# Patient Record
Sex: Female | Born: 1955 | Race: Black or African American | Hispanic: No | Smoking: Current every day smoker
Health system: Southern US, Community
[De-identification: ages and names within clinical notes are randomized; demographics above are authoritative.]

## PROBLEM LIST (undated history)

## (undated) DIAGNOSIS — I10 Essential (primary) hypertension: Secondary | ICD-10-CM

## (undated) DIAGNOSIS — R0600 Dyspnea, unspecified: Secondary | ICD-10-CM

## (undated) DIAGNOSIS — J449 Chronic obstructive pulmonary disease, unspecified: Secondary | ICD-10-CM

## (undated) DIAGNOSIS — I509 Heart failure, unspecified: Secondary | ICD-10-CM

## (undated) DIAGNOSIS — I700 Atherosclerosis of aorta: Secondary | ICD-10-CM

## (undated) DIAGNOSIS — Z9981 Dependence on supplemental oxygen: Secondary | ICD-10-CM

## (undated) DIAGNOSIS — M722 Plantar fascial fibromatosis: Secondary | ICD-10-CM

## (undated) DIAGNOSIS — R600 Localized edema: Secondary | ICD-10-CM

## (undated) HISTORY — PX: ABDOMINAL HYSTERECTOMY: SHX81

## (undated) HISTORY — DX: Essential (primary) hypertension: I10

## (undated) HISTORY — DX: Chronic obstructive pulmonary disease, unspecified: J44.9

## (undated) HISTORY — PX: BACK SURGERY: SHX140

---

## 1998-08-08 ENCOUNTER — Encounter: Payer: Self-pay | Admitting: Surgery

## 1998-08-11 ENCOUNTER — Ambulatory Visit (HOSPITAL_COMMUNITY): Admission: RE | Admit: 1998-08-11 | Discharge: 1998-08-11 | Payer: Self-pay | Admitting: Surgery

## 1998-12-05 ENCOUNTER — Ambulatory Visit (HOSPITAL_COMMUNITY): Admission: RE | Admit: 1998-12-05 | Discharge: 1998-12-05 | Payer: Self-pay | Admitting: Internal Medicine

## 1998-12-05 ENCOUNTER — Encounter: Payer: Self-pay | Admitting: Internal Medicine

## 1999-04-14 ENCOUNTER — Inpatient Hospital Stay (HOSPITAL_COMMUNITY): Admission: RE | Admit: 1999-04-14 | Discharge: 1999-04-15 | Payer: Self-pay | Admitting: *Deleted

## 1999-04-14 ENCOUNTER — Encounter: Payer: Self-pay | Admitting: Surgery

## 1999-11-11 ENCOUNTER — Encounter: Admission: RE | Admit: 1999-11-11 | Discharge: 1999-11-11 | Payer: Self-pay | Admitting: *Deleted

## 2001-10-25 ENCOUNTER — Emergency Department (HOSPITAL_COMMUNITY): Admission: EM | Admit: 2001-10-25 | Discharge: 2001-10-25 | Payer: Self-pay | Admitting: Emergency Medicine

## 2001-10-25 ENCOUNTER — Encounter: Payer: Self-pay | Admitting: Emergency Medicine

## 2006-11-18 ENCOUNTER — Inpatient Hospital Stay (HOSPITAL_COMMUNITY): Admission: AD | Admit: 2006-11-18 | Discharge: 2006-11-20 | Payer: Self-pay | Admitting: Family Medicine

## 2007-01-05 ENCOUNTER — Emergency Department (HOSPITAL_COMMUNITY): Admission: EM | Admit: 2007-01-05 | Discharge: 2007-01-06 | Payer: Self-pay | Admitting: Emergency Medicine

## 2008-05-15 ENCOUNTER — Inpatient Hospital Stay (HOSPITAL_COMMUNITY): Admission: EM | Admit: 2008-05-15 | Discharge: 2008-05-17 | Payer: Self-pay | Admitting: Emergency Medicine

## 2008-05-15 ENCOUNTER — Ambulatory Visit: Payer: Self-pay | Admitting: Cardiology

## 2008-05-17 ENCOUNTER — Encounter: Payer: Self-pay | Admitting: Cardiology

## 2008-06-05 ENCOUNTER — Ambulatory Visit: Payer: Self-pay | Admitting: Internal Medicine

## 2008-06-10 ENCOUNTER — Ambulatory Visit: Payer: Self-pay | Admitting: *Deleted

## 2008-07-18 ENCOUNTER — Ambulatory Visit: Payer: Self-pay | Admitting: Family Medicine

## 2008-07-18 LAB — CONVERTED CEMR LAB
BUN: 11 mg/dL (ref 6–23)
CO2: 28 meq/L (ref 19–32)
Calcium: 9.4 mg/dL (ref 8.4–10.5)
Creatinine, Ser: 0.93 mg/dL (ref 0.40–1.20)
Pro B Natriuretic peptide (BNP): 308 pg/mL — ABNORMAL HIGH (ref 0.0–100.0)
Sodium: 143 meq/L (ref 135–145)
Total Bilirubin: 0.9 mg/dL (ref 0.3–1.2)
Total Protein: 6.6 g/dL (ref 6.0–8.3)

## 2008-07-22 ENCOUNTER — Ambulatory Visit: Payer: Self-pay | Admitting: Family Medicine

## 2008-09-04 ENCOUNTER — Ambulatory Visit: Payer: Self-pay | Admitting: Family Medicine

## 2008-09-04 LAB — CONVERTED CEMR LAB
Albumin: 4.1 g/dL (ref 3.5–5.2)
BUN: 13 mg/dL (ref 6–23)
CO2: 24 meq/L (ref 19–32)
Creatinine, Ser: 0.87 mg/dL (ref 0.40–1.20)
Glucose, Bld: 118 mg/dL — ABNORMAL HIGH (ref 70–99)
Potassium: 4.2 meq/L (ref 3.5–5.3)
Pro B Natriuretic peptide (BNP): 88.2 pg/mL (ref 0.0–100.0)
Sodium: 139 meq/L (ref 135–145)
Total Bilirubin: 0.5 mg/dL (ref 0.3–1.2)
Total Protein: 7.1 g/dL (ref 6.0–8.3)

## 2008-09-11 ENCOUNTER — Ambulatory Visit: Payer: Self-pay | Admitting: Internal Medicine

## 2008-11-05 ENCOUNTER — Ambulatory Visit: Payer: Self-pay | Admitting: Internal Medicine

## 2008-11-05 ENCOUNTER — Encounter: Payer: Self-pay | Admitting: Family Medicine

## 2008-11-05 LAB — CONVERTED CEMR LAB
ALT: 17 units/L (ref 0–35)
AST: 17 units/L (ref 0–37)
Albumin: 4.4 g/dL (ref 3.5–5.2)
Alkaline Phosphatase: 63 units/L (ref 39–117)
Basophils Absolute: 0 10*3/uL (ref 0.0–0.1)
Creatinine, Ser: 0.75 mg/dL (ref 0.40–1.20)
Eosinophils Relative: 1 % (ref 0–5)
Glucose, Bld: 96 mg/dL (ref 70–99)
Hemoglobin: 18.7 g/dL — ABNORMAL HIGH (ref 12.0–15.0)
LDL Cholesterol: 110 mg/dL — ABNORMAL HIGH (ref 0–99)
Lymphocytes Relative: 38 % (ref 12–46)
Monocytes Absolute: 0.5 10*3/uL (ref 0.1–1.0)
Monocytes Relative: 8 % (ref 3–12)
Neutro Abs: 3.1 10*3/uL (ref 1.7–7.7)
RBC: 6.26 M/uL — ABNORMAL HIGH (ref 3.87–5.11)
RDW: 14.2 % (ref 11.5–15.5)
TSH: 2.304 microintl units/mL (ref 0.350–4.50)
Total Bilirubin: 0.9 mg/dL (ref 0.3–1.2)
Total Protein: 7.4 g/dL (ref 6.0–8.3)
Triglycerides: 186 mg/dL — ABNORMAL HIGH (ref <150)
VLDL: 37 mg/dL (ref 0–40)

## 2008-11-11 ENCOUNTER — Ambulatory Visit: Payer: Self-pay | Admitting: Internal Medicine

## 2009-09-01 ENCOUNTER — Emergency Department (HOSPITAL_COMMUNITY): Admission: EM | Admit: 2009-09-01 | Discharge: 2009-09-02 | Payer: Self-pay | Admitting: Emergency Medicine

## 2009-09-22 ENCOUNTER — Ambulatory Visit: Payer: Self-pay | Admitting: Internal Medicine

## 2009-09-26 ENCOUNTER — Ambulatory Visit (HOSPITAL_COMMUNITY): Admission: RE | Admit: 2009-09-26 | Discharge: 2009-09-26 | Payer: Self-pay | Admitting: Internal Medicine

## 2009-10-09 ENCOUNTER — Ambulatory Visit: Payer: Self-pay | Admitting: Internal Medicine

## 2009-10-09 ENCOUNTER — Encounter: Payer: Self-pay | Admitting: Family Medicine

## 2009-10-09 LAB — CONVERTED CEMR LAB
ALT: 19 units/L (ref 0–35)
AST: 19 units/L (ref 0–37)
Albumin: 4.5 g/dL (ref 3.5–5.2)
Alkaline Phosphatase: 51 units/L (ref 39–117)
BUN: 17 mg/dL (ref 6–23)
Basophils Absolute: 0 10*3/uL (ref 0.0–0.1)
Basophils Relative: 0 % (ref 0–1)
Calcium: 9.4 mg/dL (ref 8.4–10.5)
Cholesterol: 179 mg/dL (ref 0–200)
Eosinophils Absolute: 0.1 10*3/uL (ref 0.0–0.7)
HCT: 55.8 % — ABNORMAL HIGH (ref 36.0–46.0)
Hemoglobin: 17.8 g/dL — ABNORMAL HIGH (ref 12.0–15.0)
LDL Cholesterol: 114 mg/dL — ABNORMAL HIGH (ref 0–99)
MCHC: 31.9 g/dL (ref 30.0–36.0)
Monocytes Absolute: 0.6 10*3/uL (ref 0.1–1.0)
Platelets: 185 10*3/uL (ref 150–400)
Potassium: 4 meq/L (ref 3.5–5.3)
RBC: 5.91 M/uL — ABNORMAL HIGH (ref 3.87–5.11)
RDW: 15.6 % — ABNORMAL HIGH (ref 11.5–15.5)
Sodium: 144 meq/L (ref 135–145)
TSH: 1.012 microintl units/mL (ref 0.350–4.500)
Total CHOL/HDL Ratio: 3.9
Triglycerides: 97 mg/dL (ref <150)
WBC: 5.8 10*3/uL (ref 4.0–10.5)

## 2009-10-15 ENCOUNTER — Ambulatory Visit: Payer: Self-pay | Admitting: Internal Medicine

## 2011-02-25 LAB — POCT CARDIAC MARKERS: CKMB, poc: <1 ng/mL — ABNORMAL LOW (ref 1.0–8.0)

## 2011-02-25 LAB — BASIC METABOLIC PANEL
BUN: 11 mg/dL (ref 6–23)
CO2: 26 mEq/L (ref 19–32)
Calcium: 8.9 mg/dL (ref 8.4–10.5)
Chloride: 103 mEq/L (ref 96–112)
Creatinine, Ser: 0.86 mg/dL (ref 0.4–1.2)
Potassium: 3.9 mEq/L (ref 3.5–5.1)
Sodium: 137 mEq/L (ref 135–145)

## 2011-02-25 LAB — CBC
Hemoglobin: 18.2 g/dL — ABNORMAL HIGH (ref 12.0–15.0)
MCHC: 33.7 g/dL (ref 30.0–36.0)
MCV: 90.3 fL (ref 78.0–100.0)
RBC: 5.98 MIL/uL — ABNORMAL HIGH (ref 3.87–5.11)
RDW: 14.7 % (ref 11.5–15.5)

## 2011-02-25 LAB — DIFFERENTIAL
Basophils Absolute: 0 10*3/uL (ref 0.0–0.1)
Eosinophils Absolute: 0.1 10*3/uL (ref 0.0–0.7)
Eosinophils Relative: 2 % (ref 0–5)
Neutro Abs: 3.7 10*3/uL (ref 1.7–7.7)

## 2011-04-06 NOTE — Discharge Summary (Signed)
NAMEBRISA, AUTH              ACCOUNT NO.:  [id number]   MEDICAL RECORD NO.:  [id number]          PATIENT TYPE:  INP   LOCATION:  2031                         FACILITY:  [facility]   PHYSICIAN:  Joellyn Rued, PA-C     DATE OF BIRTH:  01/19/1956   DATE OF ADMISSION:  05/15/2008  DATE OF DISCHARGE:  05/17/2008                         DISCHARGE SUMMARY - REFERRING   DISCHARGE DIAGNOSIS:  1. Abnormal EKG.  2. Atypical noncardiac chest discomfort.  3. Hypertension, poorly controlled.  4. Noncompliance, possibly secondary to financial issues.  5. Tobacco use.  6. Hyperlipidemia history as noted previously.   BRIEF HISTORY:  Shari Obrien is a 54 year old African American female who  is referred from Urgent Care secondary to her abnormal EKG.  She states  that while eating a sandwich yesterday she developed abdominal  discomfort followed by diarrhea which lasted approximately 6 hours.  She  had a poor appetite today and some lower extremity edema.  However, her  symptoms resolved.  She was out of her blood pressure medications, so  she went to the Urgent Care Center and was transferred to [facility].  On presentation to [facility], she reported an upper chest  pressure and shortness of breath.  Symptoms began with stretching  movements of her upper extremities.  It is not clear as when she ran out  of her medications for her history of hypertension.  History is also  notable for tobacco use.   LABORATORY DATA:  Admission weight was 111.5 kg.  Admission H&H was  17.8, 51.9, normal indices, platelets 215, WBCs 7.5.  PTT 105, PT 14.3,  sodium 145, potassium 3.4, BUN 11, creatinine 0.95, glucose 96, normal  LFTs.  CK MBs relative indexes and troponins did not indicate an injury  pattern.  BNP was 147.  Fasting lipids 179, triglycerides 83, HDL 32,  LDL 130.  TSH 1.762.  EKGs showed normal sinus rhythm, normal axis,  delayed R-wave, nonspecific ST-T wave changes.  Chest x-ray June  24  showed cardiomegaly, pulmonary vascular congestion, mild increased lung  markings which appeared chronic in origin.  Calcified torturous aorta.   HOSPITAL COURSE:  The patient was admitted to the floor with a blood  pressure 200/120.  She received sublingual nitroglycerin which improved  her blood pressure.  Overnight, she had minimal shortness of breath and  no problems with chest discomfort.  She had ruled out for myocardial  infarction.  Blood pressure remained elevated.  She was initially  scheduled for a stress Myoview.  However, given her elevated blood  pressure, her medications were again given early enough to review.  Dr.  Eden Emms felt that she did not specifically need the stress test or  echocardiogram.  The patient was very upset.  Dr. Eden Emms tried several  times to explain to her, her major concern within regards to her  untreated and uncontrolled hypertension.  Nursing staff gave her a copy  of the [employer] list with medications that she could obtain for four  dollars.  While here in the hospital.  Case management also assisted  with discharge needs  and referral to [facility].  She received 3 days  worth of medication prior to being discharge.  Echocardiogram was  performed on the 26th and showed a normal EF, LVH, possible diastolic  dysfunction.  It was felt that she could be discharged home.  It was  noted at the time the patient was refusing to have a stress Myoview  performed.   DISPOSITION:  The patient was discharged home after receiving 3 days  worth of medications from the pharmacy.  Medications included Coreg 25  mg b.i.d., clonidine 0.1 mg b.i.d., lisinopril/HCTZ 40/25 daily.  These  are available at [facility] for four dollars.  She was asked to arrange an  appointment with [facility] which social work assisted with, and she  has this scheduled at 10:00 a.m. on 05/30/2008.  She is advised no  smoking or tobacco products, to begin a blood pressure diary and  to  bring all medications and her blood pressure diary to all follow-up  appointments.  She was also reminded of a low-sodium, heart-healthy  diet.   TIME OF DISCHARGE:  Thirty five 35 minutes      Joellyn Rued, PA-C     EW/MEDQ  D:  05/17/2008  T:  05/17/2008  Job:  [phone] [id number]   cc:   [facility]  [facility]

## 2011-04-06 NOTE — H&P (Signed)
Shari, Obrien              ACCOUNT NO.:  [id number]   MEDICAL RECORD NO.:  [id number]          PATIENT TYPE:  INP   LOCATION:  1844                         FACILITY:  [facility]   PHYSICIAN:  Theodore Demark, PA-C   DATE OF BIRTH:  [birth year]   DATE OF ADMISSION:  05/15/2008  DATE OF DISCHARGE:                              HISTORY & PHYSICAL   PRIMARY CARE PHYSICIAN:  [facility].   PRIMARY CARDIOLOGIST:  New.   CHIEF COMPLAINT:  Chest pain.   HISTORY OF PRESENT ILLNESS:  Shari Obrien is a 55 year old female with no  previous history of coronary artery disease.  She ate a sandwich from a  machine yesterday and later that day had abdominal pain followed by  diarrhea.  Her symptoms had gradually eased and she has had only one  episode of diarrhea in the last 6 hours.  She did not have any nausea or  vomiting, but she had a poor appetite and some diffuse abdominal pain as  well as some lower extremity edema.  This has largely resolved.  She  knew she was out of her blood pressure medications and had been out for  several weeks.  She went to urgent care where her EKG was abnormal and  she was describing chest pain, so she was sent to [facility]  [facility].   Since Shari Obrien ran out of her medication, she has noticed increased  dyspnea on exertion.  When she exerts herself with things such as  walking uphills or up steps, she feels short of breath and then develops  upper chest pressure.  She will rest and her symptoms will ease.  She  has not had symptoms that started at rest.  Currently, she is symptom-  free.   PAST MEDICAL HISTORY:  1. Hypertension.  2. Obesity.  3. History of pneumonia in 2007.   SURGICAL HISTORY:  She is status post back surgery and hysterectomy.   ALLERGIES:  She is allergic or intolerant to PERIACTIN and ALBUTEROL.   CURRENT MEDICATIONS:  Bayer Back & Body 2 tablets t.i.d.   PREVIOUS MEDICATIONS:  Hydrochlorothiazide, unknown dose.   SOCIAL HISTORY:  She lives in [place] alone and is working for a  [employer] with a job as a Insurance account manager.  She has approximately  a 20-pack-year history of ongoing tobacco use, but denies alcohol or  drug abuse.   FAMILY HISTORY:  Her mother died in her 16s and her father died in his  56s, but she is not aware of any history of coronary artery disease in  either parents or siblings.   REVIEW OF SYSTEMS:  She states that she has night sweats many nights.  She has occasional lower extremity edema.  The dyspnea on exertion is  chronic, but she feels it is little worse since she has been off her  diuretic.  She coughs occasionally and rarely wheezes, but sometimes  does hear herself.  She feels that she has been tired lately.  She has  some chronic back pain.  Other than diarrhea, she has had no GI  symptoms.  Her 14-point review of systems is otherwise negative.   PHYSICAL EXAMINATION:  VITAL SIGNS:  Temperature is 98.9, blood pressure  180/100, pulse 88, respiratory rate 18, and O2 saturation 97% on room  air.  GENERAL:  She is a well-developed [place] female in no acute  distress.  HEENT:  Normal.  NECK:  There is no lymphadenopathy, thyromegaly, bruits, or JVD noted.  CVA:  Her heart is regular in rate and rhythm with no S1-S2.  No  significant murmur, rub, or gallop is noted.  Distal pulses are intact  in all four extremities and a left femoral bruits noted.  LUNGS:  Clear to auscultation bilaterally.  SKIN:  No rashes or lesions are noted.  ABDOMEN:  Soft and diffusely tender with active bowel sounds.  EXTREMITIES:  There is no cyanosis, clubbing, or edema noted.  MUSCULOSKELETAL:  There is no joint deformity or effusions, and no spine  or CVA tenderness.  NEUROLOGIC:  She is alert and oriented.  Cranial nerves II through XII  are grossly intact.   Chest X-Ray shows cardiomegaly and pulmonary vascular congestion with  increased lung markings, which appear  chronic in a calcified aorta.   EKG, sinus rhythm, rate 80, with left atrial enlargement and diffuse T-  wave changes, which are unchanged from an EKG dated February 2008.   LABORATORY VALUES:  Hemoglobin 17.7 and hematocrit 52.  Sodium 142,  potassium 3.4, chloride 106, BUN 14, creatinine 1.1, glucose 101 and  point-of- care markers negative x1.   IMPRESSION:  Shari Obrien is a 55 year old female with longstanding and  probably poorly-controlled hypertension.  She is off her diuretic for a  few weeks and has subjective swelling as well as chest fullness and  dyspnea with exertion.  She has also become lightheaded at work in an  [employer].  She was seen in urgent care and sent to the  emergency department because of her abnormal electrocardiogram.  This is  not, however, significantly changed from an electrocardiogram over a  year ago.  Shari Obrien is advised to stay overnight and be ruled out for  a myocardial infarction.  An echocardiogram and a stress test will be  ordered.  She was initially uncertain what to do, but has chosen to  stay.  She was initially placed on IV heparin and nitroglycerin, but  with no resting chest pain and with a significantly elevated blood  pressure, the risks of the heparin outweigh the benefits and the  nitroglycerin is not needed.  She will be restarted on  hydrochlorothiazide at 25 mg a day and we will add amlodipine.  She is  encouraged to stop smoking.  We will be happy to see her as an  outpatient.      Theodore Demark, PA-C     RB/MEDQ  D:  05/15/2008  T:  05/16/2008  Job:  [id number]

## 2011-04-09 NOTE — H&P (Signed)
[name], Shari Obrien              ACCOUNT NO.:  [id number]   MEDICAL RECORD NO.:  [id number]          PATIENT TYPE:  INP   LOCATION:  6738                         FACILITY:  [facility]   PHYSICIAN:  Ruthe Mannan, M.D.       DATE OF BIRTH:  01/20/1956   DATE OF ADMISSION:  11/18/2006  DATE OF DISCHARGE:                              HISTORY & PHYSICAL   ATTENDING ON ADMISSION:  Dr. Darrick Penna.   PRIMARY CARE Tal Kempker:  Urgent care on [address].   DATE:  November 18, 2006.   TIME:  2:20 p.m.   CHIEF COMPLAINT:  She is a direct admit from [facility] for pneumonia.   HISTORY OF PRESENT ILLNESS:  The patient is a 55 year old female with  past medical history significant only for hypertension who was admitted  to our service from [facility] for pneumonia with increased  infiltrates on chest x-ray and hypoxia.  She reports that she went to  urgent care yesterday for a 1-week history of a full feeling in her  abdomen with dyspnea on exertion.  She denied any changes in her bowel  habits.  She also complains of a cough with blood-tinged sputum x1 week,  myalgias, and feeling feverish, which was subjective, since she did not  take her temperature.  She does endure some wheezing.  Chest x-ray at  urgent care yesterday showed left hilar and right/left lower lobe  infiltrates.  Her influenza test was negative.  She was diagnosed with a  community-acquired pneumonia and sent home with Levaquin 750 mg daily,  ProAIR HCF 2 puffs q.4 h., and was given Rocephin 1 g IM prior to going  home.  Her blood pressure yesterday was 221/110, so she was encouraged  to take her Lasix.  She returned to urgent care today with some  improvement of her symptoms.  She was afebrile at 98.8, but still  complained of wheezing and dyspnea on exertion.  Chest x-ray today  showed increased perihilar infiltrates.  She was also saturating 80%  with activity, and 82% at rest on room air.  She was then sent to [facility] for admission for pneumonia with hypoxia.  She  denied chest pain.   PAST MEDICAL HISTORY:  Hypertension.   SURGICAL HISTORY:  L5 laminectomy.   FAMILY HISTORY:  Diabetes on her grandmother's side.   ALLERGIES:  PERIACTIN AND ALBUTEROL.   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  She is also taking night classes for  medical coding.  She denies any drug or alcohol use.  She does endure  smoking half pack of cigarettes per month x35 years, but has not smoked  in over 1 week.   MEDICATIONS:  Lasix 20 mg p.o. q. a.m.   REVIEW OF SYSTEMS:  GENERAL:  Positive for fever, positive for fatigue,  negative foe weight loss.  HEENT:  Positive for congestion, negative for  ear pain, negative for throat tenderness, negative for headache,  negative for blurry vision.  RESPIRATORY:  Positive for cough,  positive  hemoptysis, positive for shortness of breath, positive for wheezing, no  chest pain.  MUSCULOSKELETAL:  Positive for myalgia.   PHYSICAL EXAMINATION:  VITAL SIGNS:  Temperature 99.3, pulse 104,  respiratory rate 28, blood pressure 197/106, O2 SAT 89% on 3 liters.  GENERAL:  She is alert, no acute distress, no increased work at  breathing.  HEENT:  Pupils equally round, reactive to light, no lymphadenopathy,  tympanic membranes clear without exudate.  RESPIRATORY:  Tachypnea with decreased breath sounds in the right lower  lobe, expiratory wheezes, prolong expiratory phase.  Rhonchi throughout,  and crackles in bilateral upper lobes.  CARDIOVASCULAR SYSTEM:  Tachycardic, no MRG.  EXTREMITIES:  1+ edema bilaterally.  ABDOMEN:  Soft, nontender, positive bowel sounds.   LABS:  From urgent care, white count 4.5, hemoglobin 15.5, hematocrit  47.0, platelets of 258, influenza negative.   ASSESSMENT/PLAN:  This is a 55 year old with a history of hypertension  with:  1. Pneumonia with hypoxia.  The patient  currently hypoxic with oxygen      saturation of 89% on 3 liters.  Hypoxia is likely secondary to      community acquired pneumonia.  She denies any chest pain or cardiac      history with dyspnea on exertion, but since she is only taking      Lasix at home, and her legs are edematous, we will check a 2D      echocardiogram, BMP, and rule out for an MI.  We will also check an      EKG and TSH.  Since we are going to rule out for MI, we will risk      stratify and check a fasting lipid panel and hemoglobin A1c.  We      will check a chest x-ray and continue O2 per nasal cannula and      start IV antibiotics with Rocephin and azithromycin.  We will check      a UA, urine legionella, urine strep, CBC, and CMP. The patient      currently tachypneic, but not currently retracting or febrile.      White blood cells within normal limits.  2. Hypertensive urgency.  The patient has had uncontrolled      hypertension for months, according to patient.  She is often      noncompliant with medications.  Unsure why she is only taking      Lasix.  We will hold Lasix for now, as her kidney function and      fluid status is unknown.  We will give hydralazine 10 mg x1 now and      reassess.  We will check UDS prior to starting beta blocker,      although she is tachycardic, and could tolerate a beta blocker.  3. FEN/GI, heart healthy, low-sodium diet.  4. Prophylaxis, SCDs, and Protonix.           ______________________________  Ruthe Mannan, M.D.     TA/MEDQ  D:  11/18/2006  T:  11/19/2006  Job:  [id number]

## 2011-04-09 NOTE — Discharge Summary (Signed)
Shari Obrien, Shari Obrien              ACCOUNT NO.:  [id number]   MEDICAL RECORD NO.:  [id number]          PATIENT TYPE:  INP   LOCATION:  6738                         FACILITY:  [facility]   PHYSICIAN:  Dwana Curd. Para March, M.D. DATE OF BIRTH:  01-Mar-1956   DATE OF ADMISSION:  11/18/2006  DATE OF DISCHARGE:  11/20/2006                               DISCHARGE SUMMARY   PRIMARY CARE PHYSICIAN:  [facility].   DISCHARGE DIAGNOSES:  1. Pneumonia.  2. Hypertension.   SUBJECTIVE:  The patient is a 55 year old female with a past medical  history of hypertension who was admitted directly to [facility] from  [facility] for pneumonia with infiltration noted on chest x-ray  and hypoxia.  She had a cough with blood tinged sputum for 1 week with  myalgias and subjective fevers.  Chest x-ray showed left hilar and right  lower lobe infiltrates.  She was initially sent home from urgent care  with a diagnosis of community acquired pneumonia on Levaquin.  She  returned for followup at urgent care on the day of admission, now  afebrile, but with wheezing and dyspnea on exertion.  Repeat chest x-ray  showed increasing perihilar infiltrates.  She was saturating at 80% with  activity on room air and was sent to [facility] for admission.   Notable points of physical are pulse of 104, temperature 99.3, blood  pressure 197/106, O2 saturation 89% on 3 liters.  She was alert and  oriented.  She was tachypnic with decreased breath sounds in the right  lower lobe with noted expiratory wheeze.  She was admitted for  management.   HOSPITAL COURSE:  1. Community acquired pneumonia.  The patient was admitted, placed on      Rocephin and azithromycin, along with supplemental oxygen.  She      gradually improved, was able to wean off of oxygen and by the 30th      of December was appropriate for discharge home.  She was discharged      on azithromycin 500 mg 1 tab p.o. daily the day after discharge.      Her PPD  was to be checked the day after discharge at [facility].  2. Hypertension.  The patient had significantly elevated blood      pressures and she was managed with hydrochlorothiazide, lisinopril      and Toprol.  She was also started on aspirin 325 mg p.o. daily.      She was discharged on all of these medications.  By the 30th of      December she did have stable blood pressures with systolic in the      140s.  Because she had this ACE inhibitor started, she needed a      creatinine check at followup.  3. Other issues.  The patient had a 2D echo performed during this      hospitalization.  The results of this were pending at time of      discharge.  She had a modest increase in her CK and also  in her      troponins.  Both of these trended down.  Her maximum troponin was      0.09, this was thought to be due to strain.  She also had a      cholesterol panel that was checked.  Her LDL was less than 100.   DISPOSITION:  Discharged home with followup tomorrow at [facility].  Discharge labs as discussed above.      Dwana Curd Para March, M.D.     GSD/MEDQ  D:  01/10/2007  T:  01/11/2007  Job:  [id number]

## 2011-04-09 NOTE — Discharge Summary (Signed)
NAMELEDORA, DELKER              ACCOUNT NO.:  [id number]   MEDICAL RECORD NO.:  [id number]          PATIENT TYPE:  INP   LOCATION:  6738                         FACILITY:  [facility]   PHYSICIAN:  Janetta Hora. Fields, MD  DATE OF BIRTH:  Aug 08, 1956   DATE OF ADMISSION:  11/18/2006  DATE OF DISCHARGE:  11/20/2006                               DISCHARGE SUMMARY   DISCHARGE DIAGNOSES:  1. Community acquired pneumonia.  2. Hypertension.  3. Hypokalemia.   DISCHARGE MEDICATIONS:  1. Aspirin 325 mg 1 p.o. daily.  2. Toprol-XL 100 mg 1 p.o. daily.  3. Lisinopril 5 mg 1 p.o. daily.  4. Hydrochlorothiazide 12.5 mg 1 p.o. daily.  5. Azithromycin 500 mg 1 p.o. daily for 1 day on Monday, November 21, 2006.   FOLLOWUP:  Patient is to followup tomorrow, November 21, 2006 at urgent  care to get a PPD check.  She will also need a followup BMET in 1 week  to check her creatinine and potassium.   DIAGNOSTIC STUDIES:  EKG:  Normal sinus rhythm with no acute ST changes.  Chest x-ray:  With diffuse perihilar infiltrate.   CONSULTANTS:  None.   ADMISSION H&P:  The patient is a pleasant 55 year old [place] American  female with a history of failed outpatient treatment for community  acquired pneumonia that presented with hypertensive urgency.   HOSPITAL COURSE:  1. Community acquired pneumonia.  The patient was admitted and placed      on IV Rocephin and Zithromax.  She was given supplemental oxygen.      She responded well to this.  She was gradually waned off of O2.      Her exercise tolerance increased as of November 20, 2006.  She was      able to ambulate freely off O2.  She was afebrile.  She still did      have productive cough.  She was having no chest pain.  She felt      that she was greatly improved from her admission status and was      asking to go home.  2. Hypertension.  The patient was admitted with a systolic blood      pressure of greater than 200.  At this point, she had  only been      taking Lasix.  She was started on a regimen of Toprol and      lisinopril in addition to her Lasix.  She tolerated all these      medications well. She did have hypokalemia that was noted.  This      was repleted.  She was initially continued on Lasix.  The patient      did have hypokalemia, which was repleted.  This could possibly be      exacerbated by the Lasix; therefore, the Lasix was stopped.  At      time of discharge, she had a blood pressure of 140 systolic/80s      diastolic.  She was successfully.  Because of the hypokalemia, her      Lasix was  stopped and she was sent home with a prescription for      hydrochlorothiazide, check her potassium and creatinine.  She will      need a followup BMET in 1 week to check her potassium and      creatinine.  3. Hypokalemia.  I repleted successfully.   LABS:  Discharge white count 5.7, hemoglobin 15.2.  Basic metabolic  panel within normal limits with a creatinine of 0.9 and a potassium of  4.0.  Hemoglobin A1c 6.4.  Total cholesterol 139, triglycerides 113, HDL  21, LDL 95.   The patient did have serial cardiac enzymes and serial EKGs during this  hospitalization.  She had a very modest increase in her troponin  initially at 0.9.  This was followed.  She was chest pain free and her  cardiac enzymes trended down.  She had no changes on the EKG.  It was  felt that this was due to strain.  Further cardiac workup is left to the  primary MD as an outpatient.      Dwana Curd Para March, M.D.      Janetta Hora. Darrick Penna, MD     GSD/MEDQ  D:  11/20/2006  T:  11/21/2006  Job:  [id number]

## 2011-08-19 LAB — POCT CARDIAC MARKERS
CKMB, poc: 1.7
CKMB, poc: <1 — ABNORMAL LOW
Myoglobin, poc: 48.2
Operator id: 277751
Troponin i, poc: <0.05

## 2011-08-19 LAB — CK TOTAL AND CKMB (NOT AT ARMC)
CK, MB: 3.3
Total CK: 181 — ABNORMAL HIGH

## 2011-08-19 LAB — B-NATRIURETIC PEPTIDE (CONVERTED LAB): Pro B Natriuretic peptide (BNP): 147 — ABNORMAL HIGH

## 2011-08-19 LAB — DIFFERENTIAL
Basophils Absolute: 0.1
Lymphs Abs: 3.3
Neutro Abs: 3.2
Neutrophils Relative %: 43

## 2011-08-19 LAB — COMPREHENSIVE METABOLIC PANEL
ALT: 35
AST: 25
Albumin: 3.2 — ABNORMAL LOW
CO2: 32
Calcium: 9
Chloride: 105
Creatinine, Ser: 0.95
GFR calc Af Amer: >60
Sodium: 145
Total Bilirubin: 1

## 2011-08-19 LAB — TROPONIN I: Troponin I: 0.04

## 2011-08-19 LAB — POCT I-STAT, CHEM 8
Calcium, Ion: 0.97 — ABNORMAL LOW
Creatinine, Ser: 1.1
Glucose, Bld: 101 — ABNORMAL HIGH

## 2011-08-19 LAB — CBC
MCHC: 34.2
Platelets: 219
WBC: 7.5

## 2011-08-19 LAB — CARDIAC PANEL(CRET KIN+CKTOT+MB+TROPI)
CK, MB: 2.3
CK, MB: 2.9
Relative Index: 2.2
Total CK: 118
Troponin I: 0.04

## 2011-08-19 LAB — PROTIME-INR
INR: 1.1
Prothrombin Time: 14.3

## 2011-08-19 LAB — LIPID PANEL: Total CHOL/HDL Ratio: 5.6

## 2011-08-19 LAB — TSH: TSH: 1.762

## 2013-08-30 ENCOUNTER — Ambulatory Visit: Payer: Self-pay | Attending: Internal Medicine | Admitting: Internal Medicine

## 2013-08-30 VITALS — BP 169/91 | HR 75 | Temp 98.2°F | Resp 17 | Wt 223.8 lb

## 2013-08-30 DIAGNOSIS — I10 Essential (primary) hypertension: Secondary | ICD-10-CM | POA: Insufficient documentation

## 2013-08-30 HISTORY — DX: Essential (primary) hypertension: I10

## 2013-08-30 MED ORDER — CARVEDILOL 25 MG PO TABS: 25.0000 mg | ORAL_TABLET | Freq: Two times a day (BID) | ORAL | Status: DC

## 2013-08-30 MED ORDER — LISINOPRIL-HYDROCHLOROTHIAZIDE 20-12.5 MG PO TABS: 1.0000 | ORAL_TABLET | Freq: Every day | ORAL | Status: DC

## 2013-08-30 MED ORDER — AMLODIPINE BESYLATE 10 MG PO TABS: 10.0000 mg | ORAL_TABLET | Freq: Every day | ORAL | Status: DC

## 2013-08-30 MED ORDER — FUROSEMIDE 20 MG PO TABS: 20.0000 mg | ORAL_TABLET | Freq: Every day | ORAL | Status: DC

## 2013-08-30 NOTE — Progress Notes (Signed)
Patient ID: Shari Obrien, female   DOB: 1956-01-12, 57 y.o.   MRN: [id number]   CC:  Regular followup  HPI: Patient is 57 year old female with hypertension who presents to clinic for followup. She explains is her first visit in this clinic, she needs refills on her blood pressure medicines as she has not taken it for over several months. She denies chest pain or shortness of breath, no specific abdominal or urinary concerns, no fevers or chills, no focal neurological symptoms. She denies checking blood pressure regularly at home and is not sure how well his control. She would like to hold off on any blood work today as she cannot financially afford it and would like to defer this until next visit.  Allergies not on file Past Medical History  Diagnosis Date  . Hypertension    No current outpatient prescriptions on file prior to visit.   No current facility-administered medications on file prior to visit.   No known family medical history History   Social History  . Marital Status: Legally Separated    Spouse Name: N/A    Number of Children: N/A  . Years of Education: N/A   Occupational History  . Not on file.   Social History Main Topics  . Smoking status: Current Every Day Smoker    Types: Cigarettes  . Smokeless tobacco: Not on file  . Alcohol Use: Not on file  . Drug Use: Not on file  . Sexual Activity: Not on file   Other Topics Concern  . Not on file   Social History Narrative  . No narrative on file    Review of Systems  Constitutional: Negative for fever, chills, diaphoresis, activity change, appetite change and fatigue.  HENT: Negative for ear pain, nosebleeds, congestion, facial swelling, rhinorrhea, neck pain, neck stiffness and ear discharge.   Eyes: Negative for pain, discharge, redness, itching and visual disturbance.  Respiratory: Negative for cough, choking, chest tightness, shortness of breath, wheezing and stridor.   Cardiovascular: Negative for chest  pain, palpitations and leg swelling.  Gastrointestinal: Negative for abdominal distention.  Genitourinary: Negative for dysuria, urgency, frequency, hematuria, flank pain, decreased urine volume, difficulty urinating and dyspareunia.  Musculoskeletal: Negative for back pain, joint swelling, arthralgias and gait problem.  Neurological: Negative for dizziness, tremors, seizures, syncope, facial asymmetry, speech difficulty, weakness, light-headedness, numbness and headaches.  Hematological: Negative for adenopathy. Does not bruise/bleed easily.  Psychiatric/Behavioral: Negative for hallucinations, behavioral problems, confusion, dysphoric mood, decreased concentration and agitation.    Objective:   Filed Vitals:   08/30/13 1423  BP: 169/91  Pulse: 75  Temp: 98.2 F (36.8 C)  Resp: 17    Physical Exam  Constitutional: Appears well-developed and well-nourished. No distress.  HENT: Normocephalic. External right and left ear normal. Oropharynx is clear and moist.  Eyes: Conjunctivae and EOM are normal. PERRLA, no scleral icterus.  Neck: Normal ROM. Neck supple. No JVD. No tracheal deviation. No thyromegaly.  CVS: RRR, S1/S2 +, no murmurs, no gallops, no carotid bruit.  Pulmonary: Effort and breath sounds normal, no stridor, rhonchi, wheezes, rales.  Abdominal: Soft. BS +,  no distension, tenderness, rebound or guarding.  Musculoskeletal: Normal range of motion. No edema and no tenderness.  Lymphadenopathy: No lymphadenopathy noted, cervical, inguinal. Neuro: Alert. Normal reflexes, muscle tone coordination. No cranial nerve deficit. Skin: Skin is warm and dry. No rash noted. Not diaphoretic. No erythema. No pallor.  Psychiatric: Normal mood and affect. Behavior, judgment, thought content normal.   Lab  Results  Component Value Date   WBC 5.8 10/09/2009   HGB 17.8* 10/09/2009   HCT 55.8* 10/09/2009   MCV 94.4 10/09/2009   PLT 185 10/09/2009   Lab Results  Component Value Date    CREATININE 0.81 10/09/2009   BUN 17 10/09/2009   NA 144 10/09/2009   K 4.0 10/09/2009   CL 105 10/09/2009   CO2 23 10/09/2009    No results found for this basename: HGBA1C   Lipid Panel     Component Value Date/Time   CHOL 179 10/09/2009 2039   TRIG 97 10/09/2009 2039   HDL 46 10/09/2009 2039   CHOLHDL 3.9 Ratio 10/09/2009 2039   VLDL 19 10/09/2009 2039   LDLCALC 114* 10/09/2009 2039       Assessment and plan:   Patient Active Problem List   Diagnosis Date Noted  . HTN (hypertension) 08/30/2013  - uncontrolled and above target goal, we have discussed target blood pressure and importance of medical compliance - Will provide refill on today's visit, I have advised patient to come back within next week to a month for blood work which would include electrolyte panel, cholesterol panel, A1c - Patient verbalized understanding if she is not able to give blood test done today due to financial difficulties - She is in process of applying for orange card and will like to have blood work done once that's completed

## 2013-08-30 NOTE — Progress Notes (Signed)
Patient here to establish care Needs HTN medications refilled

## 2013-08-30 NOTE — Patient Instructions (Signed)

## 2014-09-18 ENCOUNTER — Other Ambulatory Visit: Payer: Self-pay | Admitting: *Deleted

## 2014-09-18 MED ORDER — AMLODIPINE BESYLATE 10 MG PO TABS
10.0000 mg | ORAL_TABLET | Freq: Every day | ORAL | Status: DC
Start: 2014-09-18 — End: 2014-09-23

## 2014-09-18 MED ORDER — CARVEDILOL 25 MG PO TABS
25.0000 mg | ORAL_TABLET | Freq: Two times a day (BID) | ORAL | Status: DC
Start: 2014-09-18 — End: 2014-09-23

## 2014-09-18 MED ORDER — LISINOPRIL-HYDROCHLOROTHIAZIDE 20-12.5 MG PO TABS: 1.0000 | ORAL_TABLET | Freq: Every day | ORAL | Status: DC

## 2014-09-18 MED ORDER — FUROSEMIDE 20 MG PO TABS: 20.0000 mg | ORAL_TABLET | Freq: Every day | ORAL | Status: DC

## 2014-09-23 ENCOUNTER — Encounter: Payer: Self-pay | Admitting: Internal Medicine

## 2014-09-23 ENCOUNTER — Ambulatory Visit: Payer: Self-pay | Attending: Internal Medicine | Admitting: Internal Medicine

## 2014-09-23 VITALS — BP 143/77 | HR 77 | Temp 97.9°F | Resp 18 | Ht 66.0 in | Wt 223.0 lb

## 2014-09-23 DIAGNOSIS — I10 Essential (primary) hypertension: Secondary | ICD-10-CM | POA: Insufficient documentation

## 2014-09-23 DIAGNOSIS — J42 Unspecified chronic bronchitis: Secondary | ICD-10-CM | POA: Insufficient documentation

## 2014-09-23 DIAGNOSIS — J449 Chronic obstructive pulmonary disease, unspecified: Secondary | ICD-10-CM | POA: Insufficient documentation

## 2014-09-23 DIAGNOSIS — Z90710 Acquired absence of both cervix and uterus: Secondary | ICD-10-CM | POA: Insufficient documentation

## 2014-09-23 DIAGNOSIS — F17210 Nicotine dependence, cigarettes, uncomplicated: Secondary | ICD-10-CM | POA: Insufficient documentation

## 2014-09-23 DIAGNOSIS — Z1239 Encounter for other screening for malignant neoplasm of breast: Secondary | ICD-10-CM

## 2014-09-23 MED ORDER — CARVEDILOL 25 MG PO TABS: 25.0000 mg | ORAL_TABLET | Freq: Two times a day (BID) | ORAL | Status: DC

## 2014-09-23 MED ORDER — ALBUTEROL SULFATE HFA 108 (90 BASE) MCG/ACT IN AERS: 2.0000 | INHALATION_SPRAY | Freq: Four times a day (QID) | RESPIRATORY_TRACT | Status: DC | PRN

## 2014-09-23 MED ORDER — LISINOPRIL-HYDROCHLOROTHIAZIDE 20-12.5 MG PO TABS: 1.0000 | ORAL_TABLET | Freq: Every day | ORAL | Status: DC

## 2014-09-23 MED ORDER — FLUTICASONE-SALMETEROL 100-50 MCG/DOSE IN AEPB: 1.0000 | INHALATION_SPRAY | Freq: Two times a day (BID) | RESPIRATORY_TRACT | Status: DC

## 2014-09-23 MED ORDER — FUROSEMIDE 20 MG PO TABS: 20.0000 mg | ORAL_TABLET | Freq: Two times a day (BID) | ORAL | Status: DC

## 2014-09-23 MED ORDER — AMLODIPINE BESYLATE 10 MG PO TABS: 10.0000 mg | ORAL_TABLET | Freq: Every day | ORAL | Status: DC

## 2014-09-23 NOTE — Patient Instructions (Addendum)
Smoking Cessation  Quitting smoking is important to your health and has many advantages. However, it is not always easy to quit since nicotine is a very addictive drug. Oftentimes, people try 3 times or more before being able to quit. This document explains the best ways for you to prepare to quit smoking. Quitting takes hard work and a lot of effort, but you can do it.  ADVANTAGES OF QUITTING SMOKING   You will live longer, feel better, and live better.   Your body will feel the impact of quitting smoking almost immediately.   Within 20 minutes, blood pressure decreases. Your pulse returns to its normal level.   After 8 hours, carbon monoxide levels in the blood return to normal. Your oxygen level increases.   After 24 hours, the chance of having a heart attack starts to decrease. Your breath, hair, and body stop smelling like smoke.   After 48 hours, damaged nerve endings begin to recover. Your sense of taste and smell improve.   After 72 hours, the body is virtually free of nicotine. Your bronchial tubes relax and breathing becomes easier.   After 2 to 12 weeks, lungs can hold more air. Exercise becomes easier and circulation improves.   The risk of having a heart attack, stroke, cancer, or lung disease is greatly reduced.   After 1 year, the risk of coronary heart disease is cut in half.   After 5 years, the risk of stroke falls to the same as a nonsmoker.   After 10 years, the risk of lung cancer is cut in half and the risk of other cancers decreases significantly.   After 15 years, the risk of coronary heart disease drops, usually to the level of a nonsmoker.   If you are pregnant, quitting smoking will improve your chances of having a healthy baby.   The people you live with, especially any children, will be healthier.   You will have extra money to spend on things other than cigarettes.  QUESTIONS TO THINK ABOUT BEFORE ATTEMPTING TO QUIT  You may want to talk about your answers with your  health care provider.   Why do you want to quit?   If you tried to quit in the past, what helped and what did not?   What will be the most difficult situations for you after you quit? How will you plan to handle them?   Who can help you through the tough times? Your family? Friends? A health care provider?   What pleasures do you get from smoking? What ways can you still get pleasure if you quit?  Here are some questions to ask your health care provider:   How can you help me to be successful at quitting?   What medicine do you think would be best for me and how should I take it?   What should I do if I need more help?   What is smoking withdrawal like? How can I get information on withdrawal?  GET READY   Set a quit date.   Change your environment by getting rid of all cigarettes, ashtrays, matches, and lighters in your home, car, or work. Do not let people smoke in your home.   Review your past attempts to quit. Think about what worked and what did not.  GET SUPPORT AND ENCOURAGEMENT  You have a better chance of being successful if you have help. You can get support in many ways.   Tell your family, friends, and   coworkers that you are going to quit and need their support. Ask them not to smoke around you.   Get individual, group, or telephone counseling and support. Programs are available at local hospitals and health centers. Call your local health department for information about programs in your area.   Spiritual beliefs and practices may help some smokers quit.   Download a "quit meter" on your computer to keep track of quit statistics, such as how long you have gone without smoking, cigarettes not smoked, and money saved.   Get a self-help book about quitting smoking and staying off tobacco.  LEARN NEW SKILLS AND BEHAVIORS   Distract yourself from urges to smoke. Talk to someone, go for a walk, or occupy your time with a task.   Change your normal routine. Take a different route to work.  Drink tea instead of coffee. Eat breakfast in a different place.   Reduce your stress. Take a hot bath, exercise, or read a book.   Plan something enjoyable to do every day. Reward yourself for not smoking.   Explore interactive web-based programs that specialize in helping you quit.  GET MEDICINE AND USE IT CORRECTLY  Medicines can help you stop smoking and decrease the urge to smoke. Combining medicine with the above behavioral methods and support can greatly increase your chances of successfully quitting smoking.   Nicotine replacement therapy helps deliver nicotine to your body without the negative effects and risks of smoking. Nicotine replacement therapy includes nicotine gum, lozenges, inhalers, nasal sprays, and skin patches. Some may be available over-the-counter and others require a prescription.   Antidepressant medicine helps people abstain from smoking, but how this works is unknown. This medicine is available by prescription.   Nicotinic receptor partial agonist medicine simulates the effect of nicotine in your brain. This medicine is available by prescription.  Ask your health care provider for advice about which medicines to use and how to use them based on your health history. Your health care provider will tell you what side effects to look out for if you choose to be on a medicine or therapy. Carefully read the information on the package. Do not use any other product containing nicotine while using a nicotine replacement product.   RELAPSE OR DIFFICULT SITUATIONS  Most relapses occur within the first 3 months after quitting. Do not be discouraged if you start smoking again. Remember, most people try several times before finally quitting. You may have symptoms of withdrawal because your body is used to nicotine. You may crave cigarettes, be irritable, feel very hungry, cough often, get headaches, or have difficulty concentrating. The withdrawal symptoms are only temporary. They are strongest  when you first quit, but they will go away within 10-14 days.  To reduce the chances of relapse, try to:   Avoid drinking alcohol. Drinking lowers your chances of successfully quitting.   Reduce the amount of caffeine you consume. Once you quit smoking, the amount of caffeine in your body increases and can give you symptoms, such as a rapid heartbeat, sweating, and anxiety.   Avoid smokers because they can make you want to smoke.   Do not let weight gain distract you. Many smokers will gain weight when they quit, usually less than 10 pounds. Eat a healthy diet and stay active. You can always lose the weight gained after you quit.   Find ways to improve your mood other than smoking.  FOR MORE INFORMATION   www.smokefree.gov   Document Released:   11/02/2001 Document Revised: 03/25/2014 Document Reviewed: 02/17/2012  [employer] Patient Information 2015 [employer]. This information is not intended to replace advice given to you by your health care provider. Make sure you discuss any questions you have with your health care provider.      Chronic Obstructive Pulmonary Disease  Chronic obstructive pulmonary disease (COPD) is a common lung condition in which airflow from the lungs is limited. COPD is a general term that can be used to describe many different lung problems that limit airflow, including both chronic bronchitis and emphysema. If you have COPD, your lung function will probably never return to normal, but there are measures you can take to improve lung function and make yourself feel better.   CAUSES    Smoking (common).    Exposure to secondhand smoke.    Genetic problems.   Chronic inflammatory lung diseases or recurrent infections.  SYMPTOMS    Shortness of breath, especially with physical activity.    Deep, persistent (chronic) cough with a large amount of thick mucus.    Wheezing.    Rapid breaths (tachypnea).    Gray or bluish discoloration (cyanosis) of the skin, especially in  fingers, toes, or lips.    Fatigue.    Weight loss.    Frequent infections or episodes when breathing symptoms become much worse (exacerbations).    Chest tightness.  DIAGNOSIS   Your health care provider will take a medical history and perform a physical examination to make the initial diagnosis. Additional tests for COPD may include:    Lung (pulmonary) function tests.   Chest X-ray.   CT scan.   Blood tests.  TREATMENT   Treatment available to help you feel better when you have COPD includes:    Inhaler and nebulizer medicines. These help manage the symptoms of COPD and make your breathing more comfortable.   Supplemental oxygen. Supplemental oxygen is only helpful if you have a low oxygen level in your blood.    Exercise and physical activity. These are beneficial for nearly all people with COPD. Some people may also benefit from a pulmonary rehabilitation program.  HOME CARE INSTRUCTIONS    Take all medicines (inhaled or pills) as directed by your health care provider.   Avoid over-the-counter medicines or cough syrups that dry up your airway (such as antihistamines) and slow down the elimination of secretions unless instructed otherwise by your health care provider.    If you are a smoker, the most important thing that you can do is stop smoking. Continuing to smoke will cause further lung damage and breathing trouble. Ask your health care provider for help with quitting smoking. He or she can direct you to community resources or hospitals that provide support.   Avoid exposure to irritants such as smoke, chemicals, and fumes that aggravate your breathing.   Use oxygen therapy and pulmonary rehabilitation if directed by your health care provider. If you require home oxygen therapy, ask your health care provider whether you should purchase a pulse oximeter to measure your oxygen level at home.    Avoid contact with individuals who have a contagious illness.   Avoid extreme temperature  and humidity changes.   Eat healthy foods. Eating smaller, more frequent meals and resting before meals may help you maintain your strength.   Stay active, but balance activity with periods of rest. Exercise and physical activity will help you maintain your ability to do things you want to do.   Preventing infection and hospitalization   is very important when you have COPD. Make sure to receive all the vaccines your health care provider recommends, especially the pneumococcal and influenza vaccines. Ask your health care provider whether you need a pneumonia vaccine.   Learn and use relaxation techniques to manage stress.   Learn and use controlled breathing techniques as directed by your health care provider. Controlled breathing techniques include:    Pursed lip breathing. Start by breathing in (inhaling) through your nose for 1 second. Then, purse your lips as if you were going to whistle and breathe out (exhale) through the pursed lips for 2 seconds.    Diaphragmatic breathing. Start by putting one hand on your abdomen just above your waist. Inhale slowly through your nose. The hand on your abdomen should move out. Then purse your lips and exhale slowly. You should be able to feel the hand on your abdomen moving in as you exhale.    Learn and use controlled coughing to clear mucus from your lungs. Controlled coughing is a series of short, progressive coughs. The steps of controlled coughing are:    Lean your head slightly forward.    Breathe in deeply using diaphragmatic breathing.    Try to hold your breath for 3 seconds.    Keep your mouth slightly open while coughing twice.    Spit any mucus out into a tissue.    Rest and repeat the steps once or twice as needed.  SEEK MEDICAL CARE IF:    You are coughing up more mucus than usual.    There is a change in the color or thickness of your mucus.    Your breathing is more labored than usual.    Your breathing is faster than usual.    SEEK IMMEDIATE MEDICAL CARE IF:    You have shortness of breath while you are resting.    You have shortness of breath that prevents you from:   Being able to talk.    Performing your usual physical activities.    You have chest pain lasting longer than 5 minutes.    Your skin color is more cyanotic than usual.   You measure low oxygen saturations for longer than 5 minutes with a pulse oximeter.  MAKE SURE YOU:    Understand these instructions.   Will watch your condition.   Will get help right away if you are not doing well or get worse.  Document Released: 08/18/2005 Document Revised: 03/25/2014 Document Reviewed: 07/05/2013  [employer] Patient Information 2015 [employer]. This information is not intended to replace advice given to you by your health care provider. Make sure you discuss any questions you have with your health care provider.  Heart Failure  Heart failure is a condition in which the heart has trouble pumping blood. This means your heart does not pump blood efficiently for your body to work well. In some cases of heart failure, fluid may back up into your lungs or you may have swelling (edema) in your lower legs. Heart failure is usually a long-term (chronic) condition. It is important for you to take good care of yourself and follow your health care provider's treatment plan.  CAUSES   Some health conditions can cause heart failure. Those health conditions include:   High blood pressure (hypertension). Hypertension causes the heart muscle to work harder than normal. When pressure in the blood vessels is high, the heart needs to pump (contract) with more force in order to circulate blood throughout the body. High   blood pressure eventually causes the heart to become stiff and weak.   Coronary artery disease (CAD). CAD is the buildup of cholesterol and fat (plaque) in the arteries of the heart. The blockage in the arteries deprives the heart muscle of oxygen and blood. This can  cause chest pain and may lead to a heart attack. High blood pressure can also contribute to CAD.   Heart attack (myocardial infarction). A heart attack occurs when one or more arteries in the heart become blocked. The loss of oxygen damages the muscle tissue of the heart. When this happens, part of the heart muscle dies. The injured tissue does not contract as well and weakens the heart's ability to pump blood.   Abnormal heart valves. When the heart valves do not open and close properly, it can cause heart failure. This makes the heart muscle pump harder to keep the blood flowing.   Heart muscle disease (cardiomyopathy or myocarditis). Heart muscle disease is damage to the heart muscle from a variety of causes. These can include drug or alcohol abuse, infections, or unknown reasons. These can increase the risk of heart failure.   Lung disease. Lung disease makes the heart work harder because the lungs do not work properly. This can cause a strain on the heart, leading it to fail.   Diabetes. Diabetes increases the risk of heart failure. High blood sugar contributes to high fat (lipid) levels in the blood. Diabetes can also cause slow damage to tiny blood vessels that carry important nutrients to the heart muscle. When the heart does not get enough oxygen and food, it can cause the heart to become weak and stiff. This leads to a heart that does not contract efficiently.   Other conditions can contribute to heart failure. These include abnormal heart rhythms, thyroid problems, and low blood counts (anemia).  Certain unhealthy behaviors can increase the risk of heart failure, including:   Being overweight.   Smoking or chewing tobacco.   Eating foods high in fat and cholesterol.   Abusing illicit drugs or alcohol.   Lacking physical activity.  SYMPTOMS   Heart failure symptoms may vary and can be hard to detect. Symptoms may include:   Shortness of breath with activity, such as climbing  stairs.   Persistent cough.   Swelling of the feet, ankles, legs, or abdomen.   Unexplained weight gain.   Difficulty breathing when lying flat (orthopnea).   Waking from sleep because of the need to sit up and get more air.   Rapid heartbeat.   Fatigue and loss of energy.   Feeling light-headed, dizzy, or close to fainting.   Loss of appetite.   Nausea.   Increased urination during the night (nocturia).  DIAGNOSIS   A diagnosis of heart failure is based on your history, symptoms, physical examination, and diagnostic tests. Diagnostic tests for heart failure may include:   Echocardiography.   Electrocardiography.   Chest X-ray.   Blood tests.   Exercise stress test.   Cardiac angiography.   Radionuclide scans.  TREATMENT   Treatment is aimed at managing the symptoms of heart failure. Medicines, behavioral changes, or surgical intervention may be necessary to treat heart failure.   Medicines to help treat heart failure may include:   Angiotensin-converting enzyme (ACE) inhibitors. This type of medicine blocks the effects of a blood protein called angiotensin-converting enzyme. ACE inhibitors relax (dilate) the blood vessels and help lower blood pressure.   Angiotensin receptor blockers (ARBs). This type of   medicine blocks the actions of a blood protein called angiotensin. Angiotensin receptor blockers dilate the blood vessels and help lower blood pressure.   Water pills (diuretics). Diuretics cause the kidneys to remove salt and water from the blood. The extra fluid is removed through urination. This loss of extra fluid lowers the volume of blood the heart pumps.   Beta blockers. These prevent the heart from beating too fast and improve heart muscle strength.   Digitalis. This increases the force of the heartbeat.   Healthy behavior changes include:   Obtaining and maintaining a healthy weight.   Stopping smoking or chewing tobacco.   Eating heart-healthy foods.   Limiting or avoiding  alcohol.   Stopping illicit drug use.   Physical activity as directed by your health care provider.   Surgical treatment for heart failure may include:   A procedure to open blocked arteries, repair damaged heart valves, or remove damaged heart muscle tissue.   A pacemaker to improve heart muscle function and control certain abnormal heart rhythms.   An internal cardioverter defibrillator to treat certain serious abnormal heart rhythms.   A left ventricular assist device (LVAD) to assist the pumping ability of the heart.  HOME CARE INSTRUCTIONS    Take medicines only as directed by your health care provider. Medicines are important in reducing the workload of your heart, slowing the progression of heart failure, and improving your symptoms.   Do not stop taking your medicine unless directed by your health care provider.   Do not skip any dose of medicine.   Refill your prescriptions before you run out of medicine. Your medicines are needed every day.   Engage in moderate physical activity if directed by your health care provider. Moderate physical activity can benefit some people. The elderly and people with severe heart failure should consult with a health care provider for physical activity recommendations.   Eat heart-healthy foods. Food choices should be free of trans fat and low in saturated fat, cholesterol, and salt (sodium). Healthy choices include fresh or frozen fruits and vegetables, fish, lean meats, legumes, fat-free or low-fat dairy products, and whole grain or high fiber foods. Talk to a dietitian to learn more about heart-healthy foods.   Limit sodium if directed by your health care provider. Sodium restriction may reduce symptoms of heart failure in some people. Talk to a dietitian to learn more about heart-healthy seasonings.   Use healthy cooking methods. Healthy cooking methods include roasting, grilling, broiling, baking, poaching, steaming, or stir-frying. Talk to a dietitian to  learn more about healthy cooking methods.   Limit fluids if directed by your health care provider. Fluid restriction may reduce symptoms of heart failure in some people.   Weigh yourself every day. Daily weights are important in the early recognition of excess fluid. You should weigh yourself every morning after you urinate and before you eat breakfast. Wear the same amount of clothing each time you weigh yourself. Record your daily weight. Provide your health care provider with your weight record.   Monitor and record your blood pressure if directed by your health care provider.   Check your pulse if directed by your health care provider.   Lose weight if directed by your health care provider. Weight loss may reduce symptoms of heart failure in some people.   Stop smoking or chewing tobacco. Nicotine makes your heart work harder by causing your blood vessels to constrict. Do not use nicotine gum or patches before talking to   your health care provider.   Keep all follow-up visits as directed by your health care provider. This is important.   Limit alcohol intake to no more than 1 drink per day for nonpregnant women and 2 drinks per day for men. One drink equals 12 ounces of beer, 5 ounces of wine, or 1 ounces of hard liquor. Drinking more than that is harmful to your heart. Tell your health care provider if you drink alcohol several times a week. Talk with your health care provider about whether alcohol is safe for you. If your heart has already been damaged by alcohol or you have severe heart failure, drinking alcohol should be stopped completely.   Stop illicit drug use.   Stay up-to-date with immunizations. It is especially important to prevent respiratory infections through current pneumococcal and influenza immunizations.   Manage other health conditions such as hypertension, diabetes, thyroid disease, or abnormal heart rhythms as directed by your health care provider.   Learn to manage  stress.   Plan rest periods when fatigued.   Learn strategies to manage high temperatures. If the weather is extremely hot:   Avoid vigorous physical activity.   Use air conditioning or fans or seek a cooler location.   Avoid caffeine and alcohol.   Wear loose-fitting, lightweight, and light-colored clothing.   Learn strategies to manage cold temperatures. If the weather is extremely cold:   Avoid vigorous physical activity.   Layer clothes.   Wear mittens or gloves, a hat, and a scarf when going outside.   Avoid alcohol.   Obtain ongoing education and support as needed.   Participate in or seek rehabilitation as needed to maintain or improve independence and quality of life.  SEEK MEDICAL CARE IF:    Your weight increases by 03 lb/1.4 kg in 1 day or 05 lb/2.3 kg in a week.   You have increasing shortness of breath that is unusual for you.   You are unable to participate in your usual physical activities.   You tire easily.   You cough more than normal, especially with physical activity.   You have any or more swelling in areas such as your hands, feet, ankles, or abdomen.   You are unable to sleep because it is hard to breathe.   You feel like your heart is beating fast (palpitations).   You become dizzy or light-headed upon standing up.  SEEK IMMEDIATE MEDICAL CARE IF:    You have difficulty breathing.   There is a change in mental status such as decreased alertness or difficulty with concentration.   You have a pain or discomfort in your chest.   You have an episode of fainting (syncope).  MAKE SURE YOU:    Understand these instructions.   Will watch your condition.   Will get help right away if you are not doing well or get worse.  Document Released: 11/08/2005 Document Revised: 03/25/2014 Document Reviewed: 12/08/2012  [employer] Patient Information 2015 [employer]. This information is not intended to replace advice given to you by your health care provider. Make sure you discuss  any questions you have with your health care provider.

## 2014-09-23 NOTE — Progress Notes (Signed)
F/U HTN Pt stated has Hx COPD and no medication. Pt smoker, stated smoking half of pack now, was smocking 1PPD .

## 2014-09-23 NOTE — Progress Notes (Signed)
Patient ID: Shari Obrien, female   DOB: Apr 18, 1956, 58 y.o.   MRN: [id number]  CC: HTN follow up   HPI:  Patient presents to clinic for a follow up of HTN.  Patient has not been seen in the clinic in over one year.  She reports that she checks her BP at home and it is usually lower than what it is today.  She states that she has quit alcohol and has cut back to smoking only 0.5 ppd.  She is in the process of applying for the hospital discount.  She has a past medical history of COPD, CHF, and HTN.  She has not been on her inhalers in over one year but reports she is having more difficulty at night sleeping due to SOB.  She is using multiple pillows per night to help with breathing.  She has not been performing daily weights.    Allergies  Allergen Reactions  . Periactin [Cyproheptadine] Swelling   Past Medical History  Diagnosis Date  . COPD (chronic obstructive pulmonary disease) Dx 2013  . Hypertension    Current Outpatient Prescriptions on File Prior to Visit  Medication Sig Dispense Refill  . amLODipine (NORVASC) 10 MG tablet Take 1 tablet (10 mg total) by mouth daily. 30 tablet 11  . carvedilol (COREG) 25 MG tablet Take 1 tablet (25 mg total) by mouth 2 (two) times daily with a meal. 60 tablet 11  . furosemide (LASIX) 20 MG tablet Take 1 tablet (20 mg total) by mouth daily. 30 tablet 11  . lisinopril-hydrochlorothiazide (PRINZIDE,ZESTORETIC) 20-12.5 MG per tablet Take 1 tablet by mouth daily. 30 tablet 11   No current facility-administered medications on file prior to visit.   History reviewed. No pertinent family history. History   Social History  . Marital Status: Legally Separated    Spouse Name: N/A    Number of Children: N/A  . Years of Education: N/A   Occupational History  . Not on file.   Social History Main Topics  . Smoking status: Current Every Day Smoker    Types: Cigarettes  . Smokeless tobacco: Not on file  . Alcohol Use: Not on file  . Drug Use: Not on  file  . Sexual Activity: Not on file   Other Topics Concern  . Not on file   Social History Narrative    Review of Systems  HENT:       PND   Respiratory: Positive for cough, shortness of breath and wheezing.   Cardiovascular: Positive for orthopnea, claudication (unsure if related to back surgery) and leg swelling. Negative for chest pain and palpitations.  Musculoskeletal: Positive for myalgias.  Neurological: Positive for dizziness and headaches. Negative for tingling.      Objective:   Filed Vitals:   09/23/14 1200  BP: 143/77  Pulse: 77  Temp: 97.9 F (36.6 C)  Resp: 18    Physical Exam: Constitutional: Patient appears well-developed and well-nourished. No distress. HENT: Normocephalic, atraumatic, External right and left ear normal. Oropharynx is clear and moist.  Eyes: Conjunctivae and EOM are normal. PERRLA, no scleral icterus. Neck: Normal ROM. Neck supple. No JVD. No tracheal deviation. No thyromegaly. CVS: RRR, S1/S2 +, no murmurs, no gallops, no carotid bruit.  Pulmonary: Effort and breath sounds normal, no stridor, rhonchi, wheezes, rales.  Abdominal: Soft. BS +,  no distension, tenderness, rebound or guarding.  Musculoskeletal: Normal range of motion. No edema and no tenderness.  Lymphadenopathy: No lymphadenopathy noted, cervical Neuro: Alert. Normal  reflexes, muscle tone coordination. No cranial nerve deficit. Skin: Skin is warm and dry. No rash noted. Not diaphoretic. No erythema. No pallor. Psychiatric: Normal mood and affect. Behavior, judgment, thought content normal.  Lab Results  Component Value Date   WBC 5.8 10/09/2009   HGB 17.8* 10/09/2009   HCT 55.8* 10/09/2009   MCV 94.4 10/09/2009   PLT 185 10/09/2009   Lab Results  Component Value Date   CREATININE 0.81 10/09/2009   BUN 17 10/09/2009   NA 144 10/09/2009   K 4.0 10/09/2009   CL 105 10/09/2009   CO2 23 10/09/2009    No results found for: HGBA1C Lipid Panel     Component  Value Date/Time   CHOL 179 10/09/2009 2039   TRIG 97 10/09/2009 2039   HDL 46 10/09/2009 2039   CHOLHDL 3.9 Ratio 10/09/2009 2039   VLDL 19 10/09/2009 2039   LDLCALC 114* 10/09/2009 2039       Assessment and plan:   Shari Obrien was seen today for follow-up.  Diagnoses and associated orders for this visit:  Essential hypertension - Refill lisinopril-hydrochlorothiazide (PRINZIDE,ZESTORETIC) 20-12.5 MG per tablet; Take 1 tablet by mouth daily. - Refill furosemide (LASIX) 20 MG tablet; Take 1 tablet (20 mg total) by mouth 2 (two) times daily. - Refill carvedilol (COREG) 25 MG tablet; Take 1 tablet (25 mg total) by mouth 2 (two) times daily with a meal. - Refill amLODipine (NORVASC) 10 MG tablet; Take 1 tablet (10 mg total) by mouth daily. - CBC with Differential - COMPLETE METABOLIC PANEL WITH GFR - Hemoglobin A1c - TSH - Lipid panel  Chronic obstructive pulmonary disease, unspecified COPD, unspecified chronic bronchitis type - Begin albuterol (PROVENTIL HFA;VENTOLIN HFA) 108 (90 BASE) MCG/ACT inhaler; Inhale 2 puffs into the lungs every 6 (six) hours as needed for wheezing or shortness of breath. - Begin Fluticasone-Salmeterol (ADVAIR) 100-50 MCG/DOSE AEPB; Inhale 1 puff into the lungs 2 (two) times daily.  Breast cancer screening - MM Digital Screening; Future Patient had total hysterectomy does not need Pap smear  Return in about 1 week (around 09/30/2014) for Nurse Visit-BP/edema check and 3 mo PCP.        Holland Commons, NP-C [facility] [phone] 09/25/2014, 12:18 AM

## 2014-09-24 LAB — CBC WITH DIFFERENTIAL/PLATELET
Basophils Absolute: 0.1 10*3/uL (ref 0.0–0.1)
Basophils Relative: 1 % (ref 0–1)
Eosinophils Absolute: 0.3 10*3/uL (ref 0.0–0.7)
Eosinophils Relative: 4 % (ref 0–5)
HEMATOCRIT: 53.9 % — AB (ref 36.0–46.0)
Hemoglobin: 18.2 g/dL — ABNORMAL HIGH (ref 12.0–15.0)
LYMPHS ABS: 3 10*3/uL (ref 0.7–4.0)
LYMPHS PCT: 41 % (ref 12–46)
MCH: 30.4 pg (ref 26.0–34.0)
MCHC: 33.8 g/dL (ref 30.0–36.0)
MCV: 90 fL (ref 78.0–100.0)
MONOS PCT: 12 % (ref 3–12)
Monocytes Absolute: 0.9 10*3/uL (ref 0.1–1.0)
NEUTROS PCT: 42 % — AB (ref 43–77)
Neutro Abs: 3.1 10*3/uL (ref 1.7–7.7)
PLATELETS: 209 10*3/uL (ref 150–400)
RBC: 5.99 MIL/uL — ABNORMAL HIGH (ref 3.87–5.11)
RDW: 14.6 % (ref 11.5–15.5)
WBC: 7.4 10*3/uL (ref 4.0–10.5)

## 2014-09-24 LAB — COMPLETE METABOLIC PANEL WITH GFR
ALBUMIN: 4.1 g/dL (ref 3.5–5.2)
ALT: 18 U/L (ref 0–35)
AST: 18 U/L (ref 0–37)
Alkaline Phosphatase: 57 U/L (ref 39–117)
BUN: 12 mg/dL (ref 6–23)
CALCIUM: 9 mg/dL (ref 8.4–10.5)
CHLORIDE: 102 meq/L (ref 96–112)
CO2: 32 meq/L (ref 19–32)
Creat: 0.69 mg/dL (ref 0.50–1.10)
GFR, Est Non African American: >89 mL/min
Glucose, Bld: 86 mg/dL (ref 70–99)
POTASSIUM: 3.8 meq/L (ref 3.5–5.3)
SODIUM: 144 meq/L (ref 135–145)
TOTAL PROTEIN: 6.5 g/dL (ref 6.0–8.3)
Total Bilirubin: 0.5 mg/dL (ref 0.2–1.2)

## 2014-09-24 LAB — LIPID PANEL
CHOL/HDL RATIO: 3.5 ratio
Cholesterol: 149 mg/dL (ref 0–200)
HDL: 42 mg/dL (ref >39)
LDL CALC: 82 mg/dL (ref 0–99)
Triglycerides: 123 mg/dL (ref <150)
VLDL: 25 mg/dL (ref 0–40)

## 2014-09-24 LAB — TSH: TSH: 2.959 u[IU]/mL (ref 0.350–4.500)

## 2014-09-24 LAB — HEMOGLOBIN A1C
Hgb A1c MFr Bld: 6.5 % — ABNORMAL HIGH (ref <5.7)
Mean Plasma Glucose: 140 mg/dL — ABNORMAL HIGH (ref <117)

## 2014-09-30 ENCOUNTER — Ambulatory Visit: Payer: Self-pay | Attending: Internal Medicine

## 2014-09-30 ENCOUNTER — Ambulatory Visit: Payer: Self-pay | Attending: Internal Medicine | Admitting: *Deleted

## 2014-09-30 VITALS — BP 133/76 | HR 69 | Temp 97.5°F

## 2014-09-30 DIAGNOSIS — I10 Essential (primary) hypertension: Secondary | ICD-10-CM | POA: Insufficient documentation

## 2014-09-30 MED ORDER — DICLOFENAC SODIUM 1 % TD GEL: 2.0000 g | Freq: Four times a day (QID) | TRANSDERMAL | Status: DC

## 2014-09-30 MED ORDER — CYCLOBENZAPRINE HCL 10 MG PO TABS: 10.0000 mg | ORAL_TABLET | Freq: Three times a day (TID) | ORAL | Status: DC | PRN

## 2014-09-30 NOTE — Patient Instructions (Addendum)
DASH Eating Plan DASH stands for "Dietary Approaches to Stop Hypertension." The DASH eating plan is a healthy eating plan that has been shown to reduce high blood pressure (hypertension). Additional health benefits may include reducing the risk of type 2 diabetes mellitus, heart disease, and stroke. The DASH eating plan may also help with weight loss. WHAT DO I NEED TO KNOW ABOUT THE DASH EATING PLAN? For the DASH eating plan, you will follow these general guidelines:  Choose foods with a percent daily value for sodium of less than 5% (as listed on the food label).  Use salt-free seasonings or herbs instead of table salt or sea salt.  Check with your health care provider or pharmacist before using salt substitutes.  Eat lower-sodium products, often labeled as "lower sodium" or "no salt added."  Eat fresh foods.  Eat more vegetables, fruits, and low-fat dairy products.  Choose whole grains. Look for the word "whole" as the first word in the ingredient list.  Choose fish and skinless chicken or turkey more often than red meat. Limit fish, poultry, and meat to 6 oz (170 g) each day.  Limit sweets, desserts, sugars, and sugary drinks.  Choose heart-healthy fats.  Limit cheese to 1 oz (28 g) per day.  Eat more home-cooked food and less restaurant, buffet, and fast food.  Limit fried foods.  Cook foods using methods other than frying.  Limit canned vegetables. If you do use them, rinse them well to decrease the sodium.  When eating at a restaurant, ask that your food be prepared with less salt, or no salt if possible. WHAT FOODS CAN I EAT? Seek help from a dietitian for individual calorie needs. Grains Whole grain or whole wheat bread. Brown rice. Whole grain or whole wheat pasta. Quinoa, bulgur, and whole grain cereals. Low-sodium cereals. Corn or whole wheat flour tortillas. Whole grain cornbread. Whole grain crackers. Low-sodium crackers. Vegetables Fresh or frozen vegetables  (raw, steamed, roasted, or grilled). Low-sodium or reduced-sodium tomato and vegetable juices. Low-sodium or reduced-sodium tomato sauce and paste. Low-sodium or reduced-sodium canned vegetables.  Fruits All fresh, canned (in natural juice), or frozen fruits. Meat and Other Protein Products Ground beef (85% or leaner), grass-fed beef, or beef trimmed of fat. Skinless chicken or turkey. Ground chicken or turkey. Pork trimmed of fat. All fish and seafood. Eggs. Dried beans, peas, or lentils. Unsalted nuts and seeds. Unsalted canned beans. Dairy Low-fat dairy products, such as skim or 1% milk, 2% or reduced-fat cheeses, low-fat ricotta or cottage cheese, or plain low-fat yogurt. Low-sodium or reduced-sodium cheeses. Fats and Oils Tub margarines without trans fats. Light or reduced-fat mayonnaise and salad dressings (reduced sodium). Avocado. Safflower, olive, or canola oils. Natural peanut or almond butter. Other Unsalted popcorn and pretzels. The items listed above may not be a complete list of recommended foods or beverages. Contact your dietitian for more options. WHAT FOODS ARE NOT RECOMMENDED? Grains White bread. White pasta. White rice. Refined cornbread. Bagels and croissants. Crackers that contain trans fat. Vegetables Creamed or fried vegetables. Vegetables in a cheese sauce. Regular canned vegetables. Regular canned tomato sauce and paste. Regular tomato and vegetable juices. Fruits Dried fruits. Canned fruit in light or heavy syrup. Fruit juice. Meat and Other Protein Products Fatty cuts of meat. Ribs, chicken wings, bacon, sausage, bologna, salami, chitterlings, fatback, hot dogs, bratwurst, and packaged luncheon meats. Salted nuts and seeds. Canned beans with salt. Dairy Whole or 2% milk, cream, half-and-half, and cream cheese. Whole-fat or sweetened yogurt. Full-fat   cheeses or blue cheese. Nondairy creamers and whipped toppings. Processed cheese, cheese spreads, or cheese  curds. Condiments Onion and garlic salt, seasoned salt, table salt, and sea salt. Canned and packaged gravies. Worcestershire sauce. Tartar sauce. Barbecue sauce. Teriyaki sauce. Soy sauce, including reduced sodium. Steak sauce. Fish sauce. Oyster sauce. Cocktail sauce. Horseradish. Ketchup and mustard. Meat flavorings and tenderizers. Bouillon cubes. Hot sauce. Tabasco sauce. Marinades. Taco seasonings. Relishes. Fats and Oils Butter, stick margarine, lard, shortening, ghee, and bacon fat. Coconut, palm kernel, or palm oils. Regular salad dressings. Other Pickles and olives. Salted popcorn and pretzels. The items listed above may not be a complete list of foods and beverages to avoid. Contact your dietitian for more information. WHERE CAN I FIND MORE INFORMATION? National Heart, Lung, and Blood Institute: www.nhlbi.nih.gov/health/health-topics/topics/dash/ Document Released: 10/28/2011 Document Revised: 03/25/2014 Document Reviewed: 09/12/2013 ExitCare Patient Information 2015 [employer]. This information is not intended to replace advice given to you by your health care provider. Make sure you discuss any questions you have with your health care provider. Diabetes Mellitus and Food It is important for you to manage your blood sugar (glucose) level. Your blood glucose level can be greatly affected by what you eat. Eating healthier foods in the appropriate amounts throughout the day at about the same time each day will help you control your blood glucose level. It can also help slow or prevent worsening of your diabetes mellitus. Healthy eating may even help you improve the level of your blood pressure and reach or maintain a healthy weight.  HOW CAN FOOD AFFECT ME? Carbohydrates Carbohydrates affect your blood glucose level more than any other type of food. Your dietitian will help you determine how many carbohydrates to eat at each meal and teach you how to count carbohydrates. Counting  carbohydrates is important to keep your blood glucose at a healthy level, especially if you are using insulin or taking certain medicines for diabetes mellitus. Alcohol Alcohol can cause sudden decreases in blood glucose (hypoglycemia), especially if you use insulin or take certain medicines for diabetes mellitus. Hypoglycemia can be a life-threatening condition. Symptoms of hypoglycemia (sleepiness, dizziness, and disorientation) are similar to symptoms of having too much alcohol.  If your health care provider has given you approval to drink alcohol, do so in moderation and use the following guidelines:  Women should not have more than one drink per day, and men should not have more than two drinks per day. One drink is equal to:  12 oz of beer.  5 oz of wine.  1 oz of hard liquor.  Do not drink on an empty stomach.  Keep yourself hydrated. Have water, diet soda, or unsweetened iced tea.  Regular soda, juice, and other mixers might contain a lot of carbohydrates and should be counted. WHAT FOODS ARE NOT RECOMMENDED? As you make food choices, it is important to remember that all foods are not the same. Some foods have fewer nutrients per serving than other foods, even though they might have the same number of calories or carbohydrates. It is difficult to get your body what it needs when you eat foods with fewer nutrients. Examples of foods that you should avoid that are high in calories and carbohydrates but low in nutrients include:  Trans fats (most processed foods list trans fats on the Nutrition Facts label).  Regular soda.  Juice.  Candy.  Sweets, such as cake, pie, doughnuts, and cookies.  Fried foods. WHAT FOODS CAN I EAT? Have nutrient-rich foods,   which will nourish your body and keep you healthy. The food you should eat also will depend on several factors, including:  The calories you need.  The medicines you take.  Your weight.  Your blood glucose level.  Your  blood pressure level.  Your cholesterol level. You also should eat a variety of foods, including:  Protein, such as meat, poultry, fish, tofu, nuts, and seeds (lean animal proteins are best).  Fruits.  Vegetables.  Dairy products, such as milk, cheese, and yogurt (low fat is best).  Breads, grains, pasta, cereal, rice, and beans.  Fats such as olive oil, trans fat-free margarine, canola oil, avocado, and olives. DOES EVERYONE WITH DIABETES MELLITUS HAVE THE SAME MEAL PLAN? Because every person with diabetes mellitus is different, there is not one meal plan that works for everyone. It is very important that you meet with a dietitian who will help you create a meal plan that is just right for you. Document Released: 08/05/2005 Document Revised: 11/13/2013 Document Reviewed: 10/05/2013 ExitCare Patient Information 2015 [employer]. This information is not intended to replace advice given to you by your health care provider. Make sure you discuss any questions you have with your health care provider. Diabetes and Exercise Exercising regularly is important. It is not just about losing weight. It has many health benefits, such as:  Improving your overall fitness, flexibility, and endurance.  Increasing your bone density.  Helping with weight control.  Decreasing your body fat.  Increasing your muscle strength.  Reducing stress and tension.  Improving your overall health. People with diabetes who exercise gain additional benefits because exercise:  Reduces appetite.  Improves the body's use of blood sugar (glucose).  Helps lower or control blood glucose.  Decreases blood pressure.  Helps control blood lipids (such as cholesterol and triglycerides).  Improves the body's use of the hormone insulin by:  Increasing the body's insulin sensitivity.  Reducing the body's insulin needs.  Decreases the risk for heart disease because exercising:  Lowers cholesterol and  triglycerides levels.  Increases the levels of good cholesterol (such as high-density lipoproteins [HDL]) in the body.  Lowers blood glucose levels. YOUR ACTIVITY PLAN  Choose an activity that you enjoy and set realistic goals. Your health care provider or diabetes educator can help you make an activity plan that works for you. Exercise regularly as directed by your health care provider. This includes:  Performing resistance training twice a week such as push-ups, sit-ups, lifting weights, or using resistance bands.  Performing 150 minutes of cardio exercises each week such as walking, running, or playing sports.  Staying active and spending no more than 90 minutes at one time being inactive. Even short bursts of exercise are good for you. Three 10-minute sessions spread throughout the day are just as beneficial as a single 30-minute session. Some exercise ideas include:  Taking the dog for a walk.  Taking the stairs instead of the elevator.  Dancing to your favorite song.  Doing an exercise video.  Doing your favorite exercise with a friend. RECOMMENDATIONS FOR EXERCISING WITH TYPE 1 OR TYPE 2 DIABETES   Check your blood glucose before exercising. If blood glucose levels are greater than 240 mg/dL, check for urine ketones. Do not exercise if ketones are present.  Avoid injecting insulin into areas of the body that are going to be exercised. For example, avoid injecting insulin into:  The arms when playing tennis.  The legs when jogging.  Keep a record of:  Food   intake before and after you exercise.  Expected peak times of insulin action.  Blood glucose levels before and after you exercise.  The type and amount of exercise you have done.  Review your records with your health care provider. Your health care provider will help you to develop guidelines for adjusting food intake and insulin amounts before and after exercising.  If you take insulin or oral hypoglycemic  agents, watch for signs and symptoms of hypoglycemia. They include:  Dizziness.  Shaking.  Sweating.  Chills.  Confusion.  Drink plenty of water while you exercise to prevent dehydration or heat stroke. Body water is lost during exercise and must be replaced.  Talk to your health care provider before starting an exercise program to make sure it is safe for you. Remember, almost any type of activity is better than none. Document Released: 01/29/2004 Document Revised: 03/25/2014 Document Reviewed: 04/17/2013 ExitCare Patient Information 2015 ExitCare, [employer]. This information is not intended to replace advice given to you by your health care provider. Make sure you discuss any questions you have with your health care provider.  

## 2014-09-30 NOTE — Progress Notes (Signed)
Patient presents for BP check States taking all meds as directed States LE edema gone after 2-3 days of restarting BP meds States able to breathe better when lying down since starting Advair and albuterol C/O chronic low midline back pain; rates 8-9/10 at present States taking Bayer Back and Body 7 tabs within 9 hour period while working States received flu vaccine at work in September 2015   BP 133/76 P 67 T 97.9 SPO2 94%  No LE edema present  Labs reviewed with patient. Patient States she eats sugary foods  all day long and has been doing this all year. Patient refusing to start metformin at this time. States she wants to change diet first. Patient educated on importance of treating diabetes to protect organ health Patient states she understands and will make diet and exercise changes. States she was told she had diabetes several years ago and was able to make diet changes  to bring numbers into normal range. PCP made aware. Patient aware that when she returns for 3 month f/u in February 2016 she will be started on medication if HgbA1c is not within normal range.    Patient instructed to not take more than 2 tabs every four hours of Bayer Back and Body as taking more can cause GI bleed  Voltaren gel and flexeril 10 mg 1 tab po tid #60 with one refill e-scribed to [facility] per PCP  Patient provided information on DASH Eating Plan, Diabetes and Food and Diabetes and Exercise

## 2014-10-01 NOTE — Progress Notes (Signed)
Labs were reviewed with patient at nurse visit. Please see note from 09/30/14

## 2014-10-03 ENCOUNTER — Other Ambulatory Visit: Payer: Self-pay | Admitting: Internal Medicine

## 2014-10-03 DIAGNOSIS — J449 Chronic obstructive pulmonary disease, unspecified: Secondary | ICD-10-CM

## 2014-10-03 MED ORDER — FLUTICASONE-SALMETEROL 100-50 MCG/DOSE IN AEPB: 1.0000 | INHALATION_SPRAY | Freq: Two times a day (BID) | RESPIRATORY_TRACT | Status: DC

## 2014-10-03 MED ORDER — ALBUTEROL SULFATE HFA 108 (90 BASE) MCG/ACT IN AERS: 2.0000 | INHALATION_SPRAY | Freq: Four times a day (QID) | RESPIRATORY_TRACT | Status: DC | PRN

## 2014-10-14 ENCOUNTER — Ambulatory Visit: Payer: Self-pay

## 2015-03-14 ENCOUNTER — Ambulatory Visit: Payer: Self-pay | Attending: Internal Medicine | Admitting: Family Medicine

## 2015-03-14 VITALS — BP 148/83 | HR 74 | Temp 98.6°F | Resp 16 | Ht 66.0 in | Wt 226.0 lb

## 2015-03-14 DIAGNOSIS — I10 Essential (primary) hypertension: Secondary | ICD-10-CM

## 2015-03-14 DIAGNOSIS — M545 Low back pain: Secondary | ICD-10-CM | POA: Insufficient documentation

## 2015-03-14 DIAGNOSIS — J449 Chronic obstructive pulmonary disease, unspecified: Secondary | ICD-10-CM

## 2015-03-14 DIAGNOSIS — F17210 Nicotine dependence, cigarettes, uncomplicated: Secondary | ICD-10-CM | POA: Insufficient documentation

## 2015-03-14 LAB — COMPREHENSIVE METABOLIC PANEL
ALBUMIN: 3.7 g/dL (ref 3.5–5.2)
ALK PHOS: 59 U/L (ref 39–117)
ALT: 17 U/L (ref 0–35)
AST: 20 U/L (ref 0–37)
BUN: 13 mg/dL (ref 6–23)
CALCIUM: 8.8 mg/dL (ref 8.4–10.5)
CO2: 28 mEq/L (ref 19–32)
Chloride: 103 mEq/L (ref 96–112)
Creat: 0.75 mg/dL (ref 0.50–1.10)
GLUCOSE: 88 mg/dL (ref 70–99)
POTASSIUM: 3.7 meq/L (ref 3.5–5.3)
SODIUM: 143 meq/L (ref 135–145)
TOTAL PROTEIN: 6.3 g/dL (ref 6.0–8.3)
Total Bilirubin: 0.5 mg/dL (ref 0.2–1.2)

## 2015-03-14 LAB — LIPID PANEL
CHOL/HDL RATIO: 4 ratio
Cholesterol: 137 mg/dL (ref 0–200)
HDL: 34 mg/dL — ABNORMAL LOW (ref >=46)
LDL Cholesterol: 88 mg/dL (ref 0–99)
Triglycerides: 73 mg/dL (ref <150)
VLDL: 15 mg/dL (ref 0–40)

## 2015-03-14 MED ORDER — FLUTICASONE-SALMETEROL 100-50 MCG/DOSE IN AEPB: 1.0000 | INHALATION_SPRAY | Freq: Two times a day (BID) | RESPIRATORY_TRACT | Status: DC

## 2015-03-14 MED ORDER — ALBUTEROL SULFATE HFA 108 (90 BASE) MCG/ACT IN AERS: 2.0000 | INHALATION_SPRAY | Freq: Four times a day (QID) | RESPIRATORY_TRACT | Status: DC | PRN

## 2015-03-14 MED ORDER — AMLODIPINE BESYLATE 10 MG PO TABS: 10.0000 mg | ORAL_TABLET | Freq: Every day | ORAL | Status: DC

## 2015-03-14 MED ORDER — LISINOPRIL-HYDROCHLOROTHIAZIDE 20-12.5 MG PO TABS: 1.0000 | ORAL_TABLET | Freq: Every day | ORAL | Status: DC

## 2015-03-14 MED ORDER — METHOCARBAMOL 500 MG PO TABS: 500.0000 mg | ORAL_TABLET | Freq: Four times a day (QID) | ORAL | Status: DC | PRN

## 2015-03-14 MED ORDER — FUROSEMIDE 20 MG PO TABS: 20.0000 mg | ORAL_TABLET | Freq: Two times a day (BID) | ORAL | Status: DC

## 2015-03-14 MED ORDER — CARVEDILOL 25 MG PO TABS: 25.0000 mg | ORAL_TABLET | Freq: Two times a day (BID) | ORAL | Status: DC

## 2015-03-14 NOTE — Progress Notes (Signed)
Subjective:      Shari Obrien, is a 59 y.o. female  [id number]    DOB - May 05, 1956  CC:  Chief Complaint  Patient presents with  . Follow-up        HPI:  Patient presents today for follow-up of hypertension and back pain. She was seen last about 6 months ago. She denies any issues except for the back pain.  She stands a lot at work and by end of day is in significant pain. She has not found Flexeril help    Allergies  Allergen Reactions  . Periactin [Cyproheptadine] Swelling    Past Medical History  Diagnosis Date  . COPD (chronic obstructive pulmonary disease) Dx 2013  . Hypertension    Solangel's family history is not on file.  History   Social History  . Marital Status: Legally Separated    Spouse Name: N/A  . Number of Children: N/A  . Years of Education: N/A   Occupational History  . Not on file.   Social History Main Topics  . Smoking status: Current Every Day Smoker    Types: Cigarettes  . Smokeless tobacco: Not on file  . Alcohol Use: Not on file  . Drug Use: Not on file  . Sexual Activity: Not on file   Other Topics Concern  . Not on file   Social History Narrative   Past Surgical History  Procedure Laterality Date  . Abdominal hysterectomy         ROS:  GEN:   Denies fever, chills,WT loss, loss o HENT:   Denies  earache, epistaxis, sore throat, or neck pain, or headaches EYES:   Denies eye pain or drainage.                LUNGS:  Denies SOB with rest or walking; couging, choking CV:   Denies CP or palpitations             EXT:    Admits to low back pain. Denies radiation into legs. NEURO:   Denies numbness or tingling, denies seizures  Objective:  Filed Vitals:   03/14/15 1011  BP: 148/83  Pulse: 74  Temp: 98.6 F (37 C)  TempSrc: Oral  Resp: 16  Height: 5\' 6"  (1.676 m)  Weight: 226 lb (102.513 kg)  SpO2: 89%    Physical Exam:  General:  in no acute distress. HEENT:  Normocephalic, atraumatic, no pallor, no  icterus, moist oral mucosa,  Neck:   Supple FROM w/o adenopathy, tenderness, or thyroidomegaly. Heart:   Normal  s1 &s2  Regular rate and rhythm, without M,G,R Lungs:   Clear to auscultation bilaterally. No increase in respiratory effort. Abdomen:  Soft, nontender, nondistended, positive bowel sounds. Exetremeties:  No pedal edema. Neuro:   Alert, awake, oriented x3, nonfocal.       Medications: Prior to Admission medications   Medication Sig Start Date End Date Taking? Authorizing Provider  albuterol (PROVENTIL HFA;VENTOLIN HFA) 108 (90 BASE) MCG/ACT inhaler Inhale 2 puffs into the lungs every 6 (six) hours as needed for wheezing or shortness of breath. 10/03/14  Yes Quentin Angstlugbemiga E Jegede, MD  amLODipine (NORVASC) 10 MG tablet Take 1 tablet (10 mg total) by mouth daily. 09/23/14  Yes Ambrose FinlandValerie A Keck, NP  carvedilol (COREG) 25 MG tablet Take 1 tablet (25 mg total) by mouth 2 (two) times daily with a meal. 09/23/14  Yes Ambrose FinlandValerie A Keck, NP  diclofenac sodium (VOLTAREN) 1 % GEL Apply 2 g topically 4 (four) times  daily. 09/30/14  Yes Ambrose Finland, NP  Fluticasone-Salmeterol (ADVAIR) 100-50 MCG/DOSE AEPB Inhale 1 puff into the lungs 2 (two) times daily. 10/03/14  Yes Quentin Angst, MD  furosemide (LASIX) 20 MG tablet Take 1 tablet (20 mg total) by mouth 2 (two) times daily. 09/23/14  Yes Ambrose Finland, NP  lisinopril-hydrochlorothiazide (PRINZIDE,ZESTORETIC) 20-12.5 MG per tablet Take 1 tablet by mouth daily. 09/23/14  Yes Ambrose Finland, NP  cyclobenzaprine (FLEXERIL) 10 MG tablet Take 1 tablet (10 mg total) by mouth 3 (three) times daily as needed for muscle spasms. Patient not taking: Reported on 03/14/2015 09/30/14   Ambrose Finland, NP    Assessment: 1. Hypertension 2. Low back pain   Plan:  Refill on medications Cmet and Lipid Take Robaxin sparingly for back spasms Follow-up in 6 months.  1.    Follow up:  The patient was given clear instructions to go to ER or return to  medical center if symptoms don't improve, worsen or new problems develop. The patient verbalized understanding.   This note has been created with Education officer, environmental. Any transcriptional errors are unintentional.   Henrietta Hoover, FNP,BC 03/14/2015, 10:37 AM   Patient ID: [name], female   DOB: 07/12/1956, 59 y.o.   MRN: [id number]  HPI   Review of Systems     Objective:   Physical Exam     Assessment:      Plan:

## 2015-03-14 NOTE — Patient Instructions (Signed)
Take medications as instructed. Take robaxin only as needed. Follow-up in 6 months with primary doctor

## 2015-03-14 NOTE — Progress Notes (Signed)
Pt is here following up on her HTN and asthma. Pt needs her medication refilled.

## 2015-05-09 ENCOUNTER — Other Ambulatory Visit: Payer: Self-pay | Admitting: Internal Medicine

## 2015-10-09 ENCOUNTER — Telehealth: Payer: Self-pay

## 2015-10-09 ENCOUNTER — Other Ambulatory Visit: Payer: Self-pay | Admitting: Family Medicine

## 2015-10-09 DIAGNOSIS — I10 Essential (primary) hypertension: Secondary | ICD-10-CM

## 2015-10-09 MED ORDER — AMLODIPINE BESYLATE 10 MG PO TABS: 10.0000 mg | ORAL_TABLET | Freq: Every day | ORAL | Status: DC

## 2015-10-09 NOTE — Telephone Encounter (Signed)
Nurse called patient, reached voicemail. Left message for patient to call [facility] with [facility], at [phone]. Nurse called patient to make patient aware of amlodipine, 1 month supply, sent to pharmacy. Patient needs appointment for additional refills.

## 2015-11-10 NOTE — Telephone Encounter (Signed)
Refilled lisinopril-HCTZ and furosemide x 1, with no refills. Called patient and had to leave a HIPAA-compliant message requesting that she schedule an appointment with Holland CommonsValerie Keck, NP, for follow up.    Shari CraverStacey Obrien, PharmD, BCPS, CPP Clinical Pharmacist Practitioner  [facility] [phone]

## 2016-01-09 ENCOUNTER — Telehealth: Payer: Self-pay | Admitting: *Deleted

## 2016-01-09 NOTE — Telephone Encounter (Signed)
Patient verified DOB Patient made aware of courtesy refills being provided in the month of December for her chronic medications. Patient was scheduled to see NP [facility] on 01/19/16 at 10:00am. Patient expressed her understanding and had no further questions at this time.

## 2016-01-19 ENCOUNTER — Encounter: Payer: Self-pay | Admitting: Internal Medicine

## 2016-01-19 ENCOUNTER — Ambulatory Visit: Payer: Self-pay | Attending: Internal Medicine | Admitting: Internal Medicine

## 2016-01-19 VITALS — BP 168/91 | HR 70 | Temp 98.4°F | Resp 17 | Ht 66.0 in | Wt 212.0 lb

## 2016-01-19 DIAGNOSIS — I10 Essential (primary) hypertension: Secondary | ICD-10-CM | POA: Insufficient documentation

## 2016-01-19 DIAGNOSIS — F17200 Nicotine dependence, unspecified, uncomplicated: Secondary | ICD-10-CM

## 2016-01-19 DIAGNOSIS — Z760 Encounter for issue of repeat prescription: Secondary | ICD-10-CM | POA: Insufficient documentation

## 2016-01-19 DIAGNOSIS — IMO0001 Reserved for inherently not codable concepts without codable children: Secondary | ICD-10-CM

## 2016-01-19 DIAGNOSIS — R7303 Prediabetes: Secondary | ICD-10-CM | POA: Insufficient documentation

## 2016-01-19 DIAGNOSIS — F17210 Nicotine dependence, cigarettes, uncomplicated: Secondary | ICD-10-CM | POA: Insufficient documentation

## 2016-01-19 DIAGNOSIS — Z79899 Other long term (current) drug therapy: Secondary | ICD-10-CM | POA: Insufficient documentation

## 2016-01-19 DIAGNOSIS — R030 Elevated blood-pressure reading, without diagnosis of hypertension: Secondary | ICD-10-CM

## 2016-01-19 DIAGNOSIS — L209 Atopic dermatitis, unspecified: Secondary | ICD-10-CM | POA: Insufficient documentation

## 2016-01-19 DIAGNOSIS — J449 Chronic obstructive pulmonary disease, unspecified: Secondary | ICD-10-CM | POA: Insufficient documentation

## 2016-01-19 DIAGNOSIS — Z888 Allergy status to other drugs, medicaments and biological substances status: Secondary | ICD-10-CM | POA: Insufficient documentation

## 2016-01-19 LAB — COMPLETE METABOLIC PANEL WITH GFR
ALT: 24 U/L (ref 6–29)
AST: 18 U/L (ref 10–35)
Albumin: 3.7 g/dL (ref 3.6–5.1)
Alkaline Phosphatase: 60 U/L (ref 33–130)
BILIRUBIN TOTAL: 0.5 mg/dL (ref 0.2–1.2)
BUN: 14 mg/dL (ref 7–25)
CALCIUM: 9 mg/dL (ref 8.6–10.4)
CHLORIDE: 103 mmol/L (ref 98–110)
CO2: 30 mmol/L (ref 20–31)
CREATININE: 0.84 mg/dL (ref 0.50–0.99)
GFR, EST AFRICAN AMERICAN: 87 mL/min (ref >=60)
GFR, Est Non African American: 76 mL/min (ref >=60)
Glucose, Bld: 100 mg/dL — ABNORMAL HIGH (ref 65–99)
Potassium: 4.5 mmol/L (ref 3.5–5.3)
Sodium: 144 mmol/L (ref 135–146)
TOTAL PROTEIN: 6.3 g/dL (ref 6.1–8.1)

## 2016-01-19 MED ORDER — AMLODIPINE BESYLATE 10 MG PO TABS: 10.0000 mg | ORAL_TABLET | Freq: Every day | ORAL | Status: DC

## 2016-01-19 MED ORDER — LISINOPRIL 20 MG PO TABS: 20.0000 mg | ORAL_TABLET | Freq: Every day | ORAL | Status: DC

## 2016-01-19 MED ORDER — CLONIDINE HCL 0.1 MG PO TABS
0.1000 mg | ORAL_TABLET | Freq: Once | ORAL | Status: AC
  Administered 2016-01-19: 0.1 mg via ORAL

## 2016-01-19 MED ORDER — HYDROCORTISONE 0.5 % EX CREA: 1.0000 "application " | TOPICAL_CREAM | Freq: Two times a day (BID) | CUTANEOUS | Status: DC

## 2016-01-19 MED ORDER — CARVEDILOL 25 MG PO TABS: 25.0000 mg | ORAL_TABLET | Freq: Two times a day (BID) | ORAL | Status: DC

## 2016-01-19 MED ORDER — TRIAMCINOLONE ACETONIDE 0.1 % EX CREA: 1.0000 "application " | TOPICAL_CREAM | Freq: Two times a day (BID) | CUTANEOUS | Status: DC

## 2016-01-19 MED ORDER — FUROSEMIDE 20 MG PO TABS: 20.0000 mg | ORAL_TABLET | Freq: Two times a day (BID) | ORAL | Status: DC

## 2016-01-19 MED FILL — ?LISINOPRIL 20 MG TABLET: 20 | 30 days supply | Qty: 30 | Fill #0

## 2016-01-19 MED FILL — AMLODIPINE BESYLATE 10 MG T: 10 | 30 days supply | Qty: 30 | Fill #0

## 2016-01-19 MED FILL — CARVEDILOL 25 MG TABLET: 25 | 30 days supply | Qty: 60 | Fill #0

## 2016-01-19 MED FILL — ?FUROSEMIDE 20 MG TABLET: 20 | 30 days supply | Qty: 60 | Fill #0

## 2016-01-19 NOTE — Progress Notes (Signed)
Patient here for follow up on her HTN and for medication refills Patient has been out of her medications for months Also requesting refills for triamcinolone cream and hydrocortisone cream

## 2016-01-19 NOTE — Progress Notes (Signed)
Patient ID: Shari Obrien, female   DOB: 06/11/1956, 60 y.o.   MRN: [id number]  CC: medication refills  HPI: Shari Obrien is a 60 y.o. female here today for a follow up visit.  Patient has past medical history of COPD and hypertension. Patient reports that she has been out of her medications for several months due to financial concerns. She states that she has not stopped smoking but has been trying to cut back without success. She denies chest pain, palpitations, headaches, blurred vision, edema. Patient would like refill of her eczema cream.   Allergies  Allergen Reactions  . Periactin [Cyproheptadine] Swelling   Past Medical History  Diagnosis Date  . COPD (chronic obstructive pulmonary disease) Dx 2013  . Hypertension    Current Outpatient Prescriptions on File Prior to Visit  Medication Sig Dispense Refill  . albuterol (PROVENTIL HFA;VENTOLIN HFA) 108 (90 BASE) MCG/ACT inhaler Inhale 2 puffs into the lungs every 6 (six) hours as needed for wheezing or shortness of breath. 3 Inhaler 3  . amLODipine (NORVASC) 10 MG tablet Take 1 tablet (10 mg total) by mouth daily. 30 tablet 0  . carvedilol (COREG) 25 MG tablet Take 1 tablet (25 mg total) by mouth 2 (two) times daily with a meal. 60 tablet 4  . diclofenac sodium (VOLTAREN) 1 % GEL Apply 2 g topically 4 (four) times daily. 100 g 2  . Fluticasone-Salmeterol (ADVAIR) 100-50 MCG/DOSE AEPB Inhale 1 puff into the lungs 2 (two) times daily. 3 each 3  . furosemide (LASIX) 20 MG tablet Take 1 tablet (20 mg total) by mouth 2 (two) times daily. 60 tablet 0  . lisinopril-hydrochlorothiazide (PRINZIDE,ZESTORETIC) 20-12.5 MG tablet Take 1 tablet by mouth daily. Need to schedule DR appt for refills 30 tablet 0  . methocarbamol (ROBAXIN) 500 MG tablet Take 1 tablet (500 mg total) by mouth every 6 (six) hours as needed for muscle spasms. 30 tablet 1   No current facility-administered medications on file prior to visit.   No family history on  file. Social History   Social History  . Marital Status: Legally Separated    Spouse Name: N/A  . Number of Children: N/A  . Years of Education: N/A   Occupational History  . Not on file.   Social History Main Topics  . Smoking status: Current Every Day Smoker    Types: Cigarettes  . Smokeless tobacco: Not on file  . Alcohol Use: Not on file  . Drug Use: Not on file  . Sexual Activity: Not on file   Other Topics Concern  . Not on file   Social History Narrative    Review of Systems: Other than what is stated in HPI, all other systems are negative.   Objective:   Filed Vitals:   01/19/16 1030 01/19/16 1535  BP: 181/107 168/91  Pulse: 69 70  Temp: 98.4 F (36.9 C)   Resp: 17     Physical Exam  Constitutional: She is oriented to person, place, and time.  Cardiovascular: Normal rate, regular rhythm and normal heart sounds.   Pulmonary/Chest: Effort normal and breath sounds normal.  Neurological: She is alert and oriented to person, place, and time.  Skin: Skin is warm and dry.  Psychiatric: She has a normal mood and affect.     Lab Results  Component Value Date   WBC 7.4 09/23/2014   HGB 18.2* 09/23/2014   HCT 53.9* 09/23/2014   MCV 90.0 09/23/2014   PLT 209 09/23/2014  Lab Results  Component Value Date   CREATININE 0.75 03/14/2015   BUN 13 03/14/2015   NA 143 03/14/2015   K 3.7 03/14/2015   CL 103 03/14/2015   CO2 28 03/14/2015    Lab Results  Component Value Date   HGBA1C 6.5* 09/23/2014   Lipid Panel     Component Value Date/Time   CHOL 137 03/14/2015 1047   TRIG 73 03/14/2015 1047   HDL 34* 03/14/2015 1047   CHOLHDL 4.0 03/14/2015 1047   VLDL 15 03/14/2015 1047   LDLCALC 88 03/14/2015 1047       Assessment and plan:   Shari Obrien was seen today for medication refill.  Diagnoses and all orders for this visit:  Elevated blood pressure -     cloNIDine (CATAPRES) tablet 0.1 mg; Take 1 tablet (0.1 mg total) by mouth once in  office  Essential hypertension -     COMPLETE METABOLIC PANEL WITH GFR -     carvedilol (COREG) 25 MG tablet; Take 1 tablet (25 mg total) by mouth 2 (two) times daily with a meal. -     amLODipine (NORVASC) 10 MG tablet; Take 1 tablet (10 mg total) by mouth daily. -     lisinopril (PRINIVIL,ZESTRIL) 20 MG tablet; Take 1 tablet (20 mg total) by mouth daily. -     furosemide (LASIX) 20 MG tablet; Take 1 tablet (20 mg total) by mouth 2 (two) times daily. BP unstable because patient has been out of medication for several months. She will go pick up medication and begin today. All meds refilled.   Prediabetes -     Hemoglobin A1c -     Vitamin D, 25-hydroxy  Tobacco use disorder -     CBC with Differential Smoking cessation discussed for 3 minutes, patient is not willing to quit at this time. Will continue to assess on each visit. Discussed increased risk for diseases such as cancer, heart disease, and stroke.   Atopic dermatitis -     triamcinolone cream (KENALOG) 0.1 %; Apply 1 application topically 2 (two) times daily. -     hydrocortisone cream 0.5 %; Apply 1 application topically 2 (two) times daily. Stable, meds refills      Return in about 2 weeks (around 02/02/2016) for Stacy--BP check and 3 mo PCP HTN.   Ambrose Finland, NP-C [facility] [phone] 01/19/2016, 10:22 AM

## 2016-01-20 ENCOUNTER — Other Ambulatory Visit: Payer: Self-pay

## 2016-01-20 LAB — VITAMIN D 25 HYDROXY (VIT D DEFICIENCY, FRACTURES): Vit D, 25-Hydroxy: 8 ng/mL — ABNORMAL LOW (ref 30–100)

## 2016-01-20 LAB — HEMOGLOBIN A1C
HEMOGLOBIN A1C: 6.4 % — AB (ref <5.7)
Mean Plasma Glucose: 137 mg/dL — ABNORMAL HIGH (ref <117)

## 2016-01-20 MED ORDER — TRIAMCINOLONE ACETONIDE 0.025 % EX OINT
1.0000 | TOPICAL_OINTMENT | Freq: Two times a day (BID) | CUTANEOUS | Status: DC
Start: 2016-01-20 — End: 2016-08-05

## 2016-01-20 MED FILL — TRIAMCINOLONE 0.025% OINT: 0.025 | 15 days supply | Qty: 30 | Fill #0

## 2016-01-27 ENCOUNTER — Telehealth: Payer: Self-pay

## 2016-01-27 DIAGNOSIS — R7989 Other specified abnormal findings of blood chemistry: Secondary | ICD-10-CM

## 2016-01-27 MED ORDER — VITAMIN D (ERGOCALCIFEROL) 1.25 MG (50000 UNIT) PO CAPS: 50000.0000 [IU] | ORAL_CAPSULE | ORAL | Status: DC

## 2016-01-27 MED FILL — VIT D2 1.25 MG (50,000 UNIT: 1.25 MG | 84 days supply | Qty: 12 | Fill #0

## 2016-01-27 NOTE — Telephone Encounter (Signed)
-----   Message from Ambrose FinlandValerie A Keck, NP sent at 01/26/2016 12:49 PM EST ----- Vitamin D is low. Please send drisdol 50,000 IU to take once weekly for 12 weeks. 12 tablets no refills. And patient is borderline diabetic. Please go over specific diet changes with patient

## 2016-01-27 NOTE — Telephone Encounter (Signed)
Tried to contact patient  Patient not available Message left on voice mail to return our call RX sent to pharmacy on file

## 2016-02-02 ENCOUNTER — Encounter: Payer: Self-pay | Admitting: Pharmacist

## 2016-02-02 ENCOUNTER — Ambulatory Visit: Payer: Self-pay | Attending: Internal Medicine | Admitting: Pharmacist

## 2016-02-02 VITALS — BP 167/81 | HR 66

## 2016-02-02 DIAGNOSIS — Z79899 Other long term (current) drug therapy: Secondary | ICD-10-CM | POA: Insufficient documentation

## 2016-02-02 DIAGNOSIS — I10 Essential (primary) hypertension: Secondary | ICD-10-CM | POA: Insufficient documentation

## 2016-02-02 DIAGNOSIS — J449 Chronic obstructive pulmonary disease, unspecified: Secondary | ICD-10-CM

## 2016-02-02 MED ORDER — FLUTICASONE-SALMETEROL 100-50 MCG/DOSE IN AEPB: 1.0000 | INHALATION_SPRAY | Freq: Two times a day (BID) | RESPIRATORY_TRACT | Status: DC

## 2016-02-02 MED ORDER — ALBUTEROL SULFATE HFA 108 (90 BASE) MCG/ACT IN AERS: 2.0000 | INHALATION_SPRAY | Freq: Four times a day (QID) | RESPIRATORY_TRACT | Status: DC | PRN

## 2016-02-02 MED ORDER — LISINOPRIL 40 MG PO TABS: 40.0000 mg | ORAL_TABLET | Freq: Every day | ORAL | Status: DC

## 2016-02-02 MED FILL — !VENTOLIN HFA INHALER: 108 (90 BAS | 30 days supply | Qty: 18 | Fill #0

## 2016-02-02 MED FILL — !ADVAIR 100/50 DISKUS: 100-50 | 30 days supply | Qty: 60 | Fill #0

## 2016-02-02 MED FILL — LISINOPRIL 40 MG TABLET: 40 | 30 days supply | Qty: 30 | Fill #0

## 2016-02-02 NOTE — Patient Instructions (Addendum)
Thanks for coming to see me!  Pick up the vitamin D and inhalers at the pharmacy  I am going to increase your lisinopril to 40 mg to help lower your blood pressure  Come back and see me in 2 weeks for a blood pressure check

## 2016-02-02 NOTE — Progress Notes (Signed)
S:    Patient arrives calmly this morning.  Presents to the clinic for hypertension evaluation.   Patient reports adherence with medications. However, she hasn't taken the Advair in a while due to being out of refills.  Current BP Medications include:  Amlodipine 10 mg po daily; carvedilol 25 mg po BID; lisinopril 20 mg po daily  Antihypertensives tried in the past include: lisinopril-HCTZ 20-12.5 mg po daily  She reports that her breathing has been difficult for the last few weeks. She has been taking Coricidin BP at home to help. She has been using her albuterol inhaler frequently due to being out of the Advair. No signs of infection.    O:   Last 3 Office BP readings: BP Readings from Last 3 Encounters:  02/02/16 167/81  01/19/16 168/91  03/14/15 148/83    BMET    Component Value Date/Time   NA 144 01/19/2016 1115   K 4.5 01/19/2016 1115   CL 103 01/19/2016 1115   CO2 30 01/19/2016 1115   GLUCOSE 100* 01/19/2016 1115   BUN 14 01/19/2016 1115   CREATININE 0.84 01/19/2016 1115   CREATININE 0.81 10/09/2009 2039   CALCIUM 9.0 01/19/2016 1115   GFRNONAA 76 01/19/2016 1115   GFRNONAA >60 09/01/2009 2330   GFRAA 87 01/19/2016 1115   GFRAA  09/01/2009 2330    >60        The eGFR has been calculated using the MDRD equation. This calculation has not been validated in all clinical situations. eGFR's persistently <60 mL/min signify possible Chronic Kidney Disease.    A/P: Hypertension longstanding diagnosed currently uncontrolled  on current medications.  Increased dose of lisinopril to 40 mg daily. Continue all other medications as prescribed.  Patient to restart Advair and instructed to use it daily and not just when needed. If breathing does not improve, she will follow up with Chari Manning. Patient will also pick up and start vitamin D.   Medication reconciliation completed. Results reviewed and written information provided.   Total time in face-to-face counseling 20  minutes.  F/U Clinic Visit with Dr. Peterson Ao in 2-4 weeks.  Patient seen with Jamie Brookes, PharmD Candidate

## 2016-02-16 ENCOUNTER — Ambulatory Visit: Payer: Self-pay | Admitting: Pharmacist

## 2016-03-03 MED FILL — AMLODIPINE BESYLATE 10 MG T: 10 | 30 days supply | Qty: 30 | Fill #1

## 2016-03-03 MED FILL — !ADVAIR 100/50 DISKUS: 100-50 | 30 days supply | Qty: 60 | Fill #1

## 2016-03-03 MED FILL — CARVEDILOL 25 MG TABLET: 25 | 30 days supply | Qty: 60 | Fill #1

## 2016-03-03 MED FILL — LISINOPRIL 40 MG TABLET: 40 | 30 days supply | Qty: 30 | Fill #1

## 2016-04-08 MED FILL — CARVEDILOL 25 MG TABLET: 25 | 30 days supply | Qty: 60 | Fill #2

## 2016-04-08 MED FILL — VENTOLIN HFA 90 MCG INHALER: 108 (90 BAS | 30 days supply | Qty: 18 | Fill #1

## 2016-04-08 MED FILL — !ADVAIR 100/50 DISKUS: 100-50 | 30 days supply | Qty: 60 | Fill #2

## 2016-04-08 MED FILL — LISINOPRIL 40 MG TABLET: 40 | 30 days supply | Qty: 30 | Fill #2

## 2016-04-08 MED FILL — AMLODIPINE BESYLATE 10 MG T: 10 | 30 days supply | Qty: 30 | Fill #2

## 2016-04-08 MED FILL — FUROSEMIDE 20 MG TABLET: 20 | 30 days supply | Qty: 60 | Fill #1

## 2016-05-17 ENCOUNTER — Other Ambulatory Visit: Payer: Self-pay | Admitting: Internal Medicine

## 2016-05-17 MED FILL — LISINOPRIL 20 MG TABLET: 20 | 30 days supply | Qty: 30 | Fill #1

## 2016-05-17 MED FILL — LISINOPRIL 40 MG TABLET: 40 | 30 days supply | Qty: 30 | Fill #0

## 2016-05-17 MED FILL — FUROSEMIDE 20 MG TABLET: 20 | 30 days supply | Qty: 60 | Fill #2

## 2016-05-17 MED FILL — AMLODIPINE BESYLATE 10 MG T: 10 | 30 days supply | Qty: 30 | Fill #3

## 2016-05-17 MED FILL — CARVEDILOL 25 MG TABLET: 25 | 30 days supply | Qty: 60 | Fill #3

## 2016-06-08 MED FILL — !VENTOLIN HFA INHALER: 108 (90 BAS | 30 days supply | Qty: 18 | Fill #2

## 2016-06-08 MED FILL — **ADVAIR 100/50 DISKUS: 100-50 MCG | 14 days supply | Qty: 28 | Fill #3

## 2016-06-22 DIAGNOSIS — R600 Localized edema: Secondary | ICD-10-CM

## 2016-06-22 HISTORY — DX: Localized edema: R60.0

## 2016-06-28 ENCOUNTER — Encounter (HOSPITAL_COMMUNITY): Payer: Self-pay

## 2016-06-28 DIAGNOSIS — Z7951 Long term (current) use of inhaled steroids: Secondary | ICD-10-CM

## 2016-06-28 DIAGNOSIS — I700 Atherosclerosis of aorta: Secondary | ICD-10-CM | POA: Diagnosis present

## 2016-06-28 DIAGNOSIS — D751 Secondary polycythemia: Secondary | ICD-10-CM | POA: Diagnosis present

## 2016-06-28 DIAGNOSIS — I5032 Chronic diastolic (congestive) heart failure: Secondary | ICD-10-CM | POA: Diagnosis present

## 2016-06-28 DIAGNOSIS — J189 Pneumonia, unspecified organism: Secondary | ICD-10-CM | POA: Diagnosis present

## 2016-06-28 DIAGNOSIS — J9601 Acute respiratory failure with hypoxia: Secondary | ICD-10-CM | POA: Diagnosis present

## 2016-06-28 DIAGNOSIS — F17210 Nicotine dependence, cigarettes, uncomplicated: Secondary | ICD-10-CM | POA: Diagnosis present

## 2016-06-28 DIAGNOSIS — J440 Chronic obstructive pulmonary disease with acute lower respiratory infection: Secondary | ICD-10-CM | POA: Diagnosis present

## 2016-06-28 DIAGNOSIS — I110 Hypertensive heart disease with heart failure: Principal | ICD-10-CM | POA: Diagnosis present

## 2016-06-28 DIAGNOSIS — M109 Gout, unspecified: Secondary | ICD-10-CM | POA: Diagnosis present

## 2016-06-28 DIAGNOSIS — Z23 Encounter for immunization: Secondary | ICD-10-CM

## 2016-06-28 DIAGNOSIS — I872 Venous insufficiency (chronic) (peripheral): Secondary | ICD-10-CM | POA: Diagnosis present

## 2016-06-28 NOTE — ED Triage Notes (Signed)
Pt complaining of bilateral leg swelling since Friday. Pt unable to bear weight on Left leg. Pt denies any chest pain or SOB.

## 2016-06-29 ENCOUNTER — Emergency Department (HOSPITAL_COMMUNITY): Payer: Self-pay

## 2016-06-29 ENCOUNTER — Inpatient Hospital Stay (HOSPITAL_COMMUNITY)
Admission: EM | Admit: 2016-06-29 | Discharge: 2016-07-03 | DRG: 291 | Disposition: A | Discharge status: Home / Self Care | Payer: Self-pay | Attending: Internal Medicine | Admitting: Internal Medicine

## 2016-06-29 ENCOUNTER — Inpatient Hospital Stay (HOSPITAL_COMMUNITY): Payer: Self-pay

## 2016-06-29 ENCOUNTER — Encounter (HOSPITAL_COMMUNITY): Payer: Self-pay | Admitting: Internal Medicine

## 2016-06-29 ENCOUNTER — Emergency Department (HOSPITAL_COMMUNITY)
Admit: 2016-06-29 | Discharge: 2016-06-29 | Disposition: A | Discharge status: Home / Self Care | Payer: Self-pay | Attending: Emergency Medicine | Admitting: Emergency Medicine

## 2016-06-29 ENCOUNTER — Other Ambulatory Visit: Payer: Self-pay

## 2016-06-29 DIAGNOSIS — I700 Atherosclerosis of aorta: Secondary | ICD-10-CM | POA: Diagnosis present

## 2016-06-29 DIAGNOSIS — J189 Pneumonia, unspecified organism: Secondary | ICD-10-CM | POA: Diagnosis present

## 2016-06-29 DIAGNOSIS — I509 Heart failure, unspecified: Secondary | ICD-10-CM | POA: Insufficient documentation

## 2016-06-29 DIAGNOSIS — L03116 Cellulitis of left lower limb: Secondary | ICD-10-CM

## 2016-06-29 DIAGNOSIS — I5030 Unspecified diastolic (congestive) heart failure: Secondary | ICD-10-CM | POA: Diagnosis present

## 2016-06-29 DIAGNOSIS — M7989 Other specified soft tissue disorders: Secondary | ICD-10-CM

## 2016-06-29 DIAGNOSIS — I10 Essential (primary) hypertension: Secondary | ICD-10-CM | POA: Diagnosis present

## 2016-06-29 DIAGNOSIS — M79671 Pain in right foot: Secondary | ICD-10-CM

## 2016-06-29 DIAGNOSIS — R0600 Dyspnea, unspecified: Secondary | ICD-10-CM

## 2016-06-29 DIAGNOSIS — M25579 Pain in unspecified ankle and joints of unspecified foot: Secondary | ICD-10-CM | POA: Diagnosis present

## 2016-06-29 HISTORY — DX: Heart failure, unspecified: I50.9

## 2016-06-29 HISTORY — DX: Atherosclerosis of aorta: I70.0

## 2016-06-29 HISTORY — DX: Localized edema: R60.0

## 2016-06-29 LAB — BASIC METABOLIC PANEL
Anion gap: 10 (ref 5–15)
BUN: 14 mg/dL (ref 6–20)
CHLORIDE: 102 mmol/L (ref 101–111)
CO2: 28 mmol/L (ref 22–32)
CREATININE: 0.76 mg/dL (ref 0.44–1.00)
Calcium: 8.8 mg/dL — ABNORMAL LOW (ref 8.9–10.3)
GFR calc non Af Amer: >60 mL/min (ref >60)
Glucose, Bld: 122 mg/dL — ABNORMAL HIGH (ref 65–99)
Potassium: 3.8 mmol/L (ref 3.5–5.1)
Sodium: 140 mmol/L (ref 135–145)

## 2016-06-29 LAB — CBC WITH DIFFERENTIAL/PLATELET
Basophils Absolute: 0 10*3/uL (ref 0.0–0.1)
Basophils Relative: 1 %
Eosinophils Absolute: 0 10*3/uL (ref 0.0–0.7)
Eosinophils Relative: 1 %
HEMATOCRIT: 59.2 % — AB (ref 36.0–46.0)
HEMOGLOBIN: 19.4 g/dL — AB (ref 12.0–15.0)
LYMPHS ABS: 1.2 10*3/uL (ref 0.7–4.0)
LYMPHS PCT: 18 %
MCH: 29.8 pg (ref 26.0–34.0)
MCHC: 32.8 g/dL (ref 30.0–36.0)
MCV: 90.9 fL (ref 78.0–100.0)
MONOS PCT: 12 %
Monocytes Absolute: 0.8 10*3/uL (ref 0.1–1.0)
NEUTROS ABS: 4.6 10*3/uL (ref 1.7–7.7)
NEUTROS PCT: 70 %
Platelets: 188 10*3/uL (ref 150–400)
RBC: 6.51 MIL/uL — ABNORMAL HIGH (ref 3.87–5.11)
RDW: 14.6 % (ref 11.5–15.5)
WBC: 6.7 10*3/uL (ref 4.0–10.5)

## 2016-06-29 LAB — BRAIN NATRIURETIC PEPTIDE: B Natriuretic Peptide: 78.8 pg/mL (ref 0.0–100.0)

## 2016-06-29 LAB — HEPATIC FUNCTION PANEL
ALT: 16 U/L (ref 14–54)
AST: 19 U/L (ref 15–41)
Albumin: 3.3 g/dL — ABNORMAL LOW (ref 3.5–5.0)
Alkaline Phosphatase: 57 U/L (ref 38–126)
Bilirubin, Direct: 0.3 mg/dL (ref 0.1–0.5)
Indirect Bilirubin: 1.2 mg/dL — ABNORMAL HIGH (ref 0.3–0.9)
TOTAL PROTEIN: 6.5 g/dL (ref 6.5–8.1)
Total Bilirubin: 1.5 mg/dL — ABNORMAL HIGH (ref 0.3–1.2)

## 2016-06-29 LAB — TROPONIN I

## 2016-06-29 LAB — D-DIMER, QUANTITATIVE: D-Dimer, Quant: 1.64 ug/mL-FEU — ABNORMAL HIGH (ref 0.00–0.50)

## 2016-06-29 MED ORDER — ENOXAPARIN SODIUM 40 MG/0.4ML ~~LOC~~ SOLN: 40.0000 mg | SUBCUTANEOUS | Status: DC

## 2016-06-29 MED ORDER — ONDANSETRON 4 MG PO TBDP
4.0000 mg | ORAL_TABLET | Freq: Once | ORAL | Status: AC
  Administered 2016-06-29: 4 mg via ORAL
  Filled 2016-06-29: qty 1

## 2016-06-29 MED ORDER — CARVEDILOL 12.5 MG PO TABS
25.0000 mg | ORAL_TABLET | Freq: Once | ORAL | Status: AC
  Administered 2016-06-29: 25 mg via ORAL
  Filled 2016-06-29: qty 2

## 2016-06-29 MED ORDER — AMLODIPINE BESYLATE 10 MG PO TABS: 10.0000 mg | ORAL_TABLET | Freq: Every day | ORAL | 1 refills | Status: DC

## 2016-06-29 MED ORDER — MOMETASONE FURO-FORMOTEROL FUM 100-5 MCG/ACT IN AERO
2.0000 | INHALATION_SPRAY | Freq: Two times a day (BID) | RESPIRATORY_TRACT | Status: DC
  Administered 2016-06-29 – 2016-07-03 (×8): 2 via RESPIRATORY_TRACT
  Filled 2016-06-29: qty 8.8

## 2016-06-29 MED ORDER — LISINOPRIL 20 MG PO TABS
40.0000 mg | ORAL_TABLET | Freq: Once | ORAL | Status: AC
Start: 2016-06-29 — End: 2016-06-29
  Administered 2016-06-29: 40 mg via ORAL
  Filled 2016-06-29: qty 2

## 2016-06-29 MED ORDER — FUROSEMIDE 20 MG PO TABS: 20.0000 mg | ORAL_TABLET | Freq: Two times a day (BID) | ORAL | 1 refills | Status: DC

## 2016-06-29 MED ORDER — LISINOPRIL 40 MG PO TABS: 40.0000 mg | ORAL_TABLET | Freq: Every day | ORAL | 1 refills | Status: DC

## 2016-06-29 MED ORDER — HYDRALAZINE HCL 20 MG/ML IJ SOLN
10.0000 mg | Freq: Once | INTRAMUSCULAR | Status: AC
  Administered 2016-06-29: 10 mg via INTRAVENOUS
  Filled 2016-06-29: qty 1

## 2016-06-29 MED ORDER — HEPARIN SODIUM (PORCINE) 5000 UNIT/ML IJ SOLN: 5000.0000 [IU] | Freq: Three times a day (TID) | INTRAMUSCULAR | Status: DC

## 2016-06-29 MED ORDER — KETOROLAC TROMETHAMINE 15 MG/ML IJ SOLN
15.0000 mg | Freq: Four times a day (QID) | INTRAMUSCULAR | Status: AC | PRN
  Administered 2016-06-29 – 2016-06-30 (×3): 15 mg via INTRAVENOUS
  Filled 2016-06-29 (×5): qty 1

## 2016-06-29 MED ORDER — ALBUTEROL SULFATE (2.5 MG/3ML) 0.083% IN NEBU: 2.5000 mg | INHALATION_SOLUTION | Freq: Four times a day (QID) | RESPIRATORY_TRACT | Status: DC | PRN

## 2016-06-29 MED ORDER — ENOXAPARIN SODIUM 40 MG/0.4ML ~~LOC~~ SOLN
40.0000 mg | SUBCUTANEOUS | Status: DC
  Administered 2016-06-29 – 2016-07-02 (×4): 40 mg via SUBCUTANEOUS
  Filled 2016-06-29 (×4): qty 0.4

## 2016-06-29 MED ORDER — HYDROMORPHONE HCL 1 MG/ML IJ SOLN
1.0000 mg | Freq: Once | INTRAMUSCULAR | Status: AC
  Administered 2016-06-29: 1 mg via INTRAVENOUS
  Filled 2016-06-29: qty 1

## 2016-06-29 MED ORDER — SULFAMETHOXAZOLE-TRIMETHOPRIM 800-160 MG PO TABS: 1.0000 | ORAL_TABLET | Freq: Two times a day (BID) | ORAL | 0 refills | Status: DC

## 2016-06-29 MED ORDER — HYDROMORPHONE HCL 1 MG/ML IJ SOLN
1.0000 mg | Freq: Once | INTRAMUSCULAR | Status: AC
Start: 2016-06-29 — End: 2016-06-29
  Administered 2016-06-29: 1 mg via INTRAVENOUS
  Filled 2016-06-29: qty 1

## 2016-06-29 MED ORDER — PROMETHAZINE HCL 25 MG PO TABS: 12.5000 mg | ORAL_TABLET | Freq: Four times a day (QID) | ORAL | Status: DC | PRN

## 2016-06-29 MED ORDER — VANCOMYCIN HCL IN DEXTROSE 1-5 GM/200ML-% IV SOLN
1000.0000 mg | Freq: Once | INTRAVENOUS | Status: AC
  Administered 2016-06-29: 1000 mg via INTRAVENOUS
  Filled 2016-06-29: qty 200

## 2016-06-29 MED ORDER — FUROSEMIDE 10 MG/ML IJ SOLN
40.0000 mg | Freq: Once | INTRAMUSCULAR | Status: AC
  Administered 2016-06-29: 40 mg via INTRAVENOUS
  Filled 2016-06-29: qty 4

## 2016-06-29 MED ORDER — IOPAMIDOL (ISOVUE-370) INJECTION 76%
INTRAVENOUS | Status: AC
  Filled 2016-06-29: qty 100

## 2016-06-29 MED ORDER — AMLODIPINE BESYLATE 5 MG PO TABS
10.0000 mg | ORAL_TABLET | Freq: Once | ORAL | Status: AC
  Administered 2016-06-29: 10 mg via ORAL
  Filled 2016-06-29: qty 2

## 2016-06-29 MED ORDER — OXYCODONE-ACETAMINOPHEN 5-325 MG PO TABS: 1.0000 | ORAL_TABLET | Freq: Four times a day (QID) | ORAL | 0 refills | Status: DC | PRN

## 2016-06-29 MED ORDER — CARVEDILOL 25 MG PO TABS: 25.0000 mg | ORAL_TABLET | Freq: Two times a day (BID) | ORAL | 1 refills | Status: DC

## 2016-06-29 MED ORDER — PNEUMOCOCCAL VAC POLYVALENT 25 MCG/0.5ML IJ INJ
0.5000 mL | INJECTION | INTRAMUSCULAR | Status: AC
  Administered 2016-06-30: 0.5 mL via INTRAMUSCULAR
  Filled 2016-06-29: qty 0.5

## 2016-06-29 NOTE — ED Notes (Signed)
Unable to draw back labs from IV start

## 2016-06-29 NOTE — ED Notes (Signed)
Report given to 3W RN

## 2016-06-29 NOTE — Discharge Planning (Signed)
Oletta Cohnamellia Izak Anding, RN, BSN, [place] [phone]. Pt qualifies for DME rolling walker.  DME  ordered through [facility].  Shaune LeeksJermaine Jenkins of [facility] notified to deliver rolling walker to pt room prior to D/C home.

## 2016-06-29 NOTE — H&P (Signed)
Date: 06/29/2016               Patient Name:  Shari Obrien MRN: [id number]  DOB: [birth year] Age / Sex: 60 y.o., female   PCP: Shari Finland, NP         Medical Service: Internal Medicine Teaching Service         Attending Physician: Dr. Burns Spain, MD    First Contact: Shari Obrien, MS4 Pager: [phone]  Second Contact: Dr. Heywood Obrien Pager: [phone]       After Hours (After 5p/  First Contact Pager: [phone]  weekends / holidays): Second Contact Pager: [phone]   Chief Complaint: left foot pain  History of Present Illness: [name] is a 60 year old female with hypertension, previously diagnosed congestive heart failure, COPD who presents emergency department for persistent left foot pain x 4 days.   Four days ago, she noted the acute onset of bilateral leg swelling which was associated with pain. She was able to treat the swelling and pain with Epsom salts and a foot massager with improvement in her right foot but did not the left foot. She describes the pain as a "scary pain" which she describes as sharp that radiates upwards when she bears weight on her left foot. At home, she is on furosemide 20 mg twice daily as she was diagnosed with congestive heart failure the most recent echo in our system is from 2009 was notable for EF 65% with left ventricular wall thickness. She denies prior cardiac disease including heart attacks or strokes, nor does she have a family history of cardiac disease but does acknowledge orthopnea at home and dyspnea which is relieved by her home inhalers. 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 She acknowledges smoking 1 pack a day for an unspecified period of time and denies any alcohol or illicit drug use. She otherwise denies fever, chills, chest pain, nausea, vomiting, poor appetite, prior history of gout.  In the ED, EKG was notable for right axis deviation. Initial troponin was negative. Chest x-ray  was notable for central vascular congestion. Left LE Dopplers were reassuring for no DVT. Left foot XR without evidence of fracture or dislocation. She received vancomycin for presumed cellulitis and Dilaudid  x 3.  Home medications [last refill 6/26] -Amlodipine 10 mg -Lisinopril 40 mg -Furosemide 20 mg twice daily -Carvedilol 25 mg twice daily   Meds:  Current Meds  Medication Sig  . albuterol (PROVENTIL HFA;VENTOLIN HFA) 108 (90 Base) MCG/ACT inhaler Inhale 2 puffs into the lungs every 6 (six) hours as needed for wheezing or shortness of breath.  . Aspirin-Caffeine (BAYER BACK & BODY PAIN EX ST) 500-32.5 MG TABS Take 2 tablets by mouth every 6 (six) hours as needed (pain).  . cetirizine (ZYRTEC) 10 MG tablet Take 10 mg by mouth daily as needed for allergies.   . Dextromethorphan-Guaifenesin (CORICIDIN HBP CONGESTION/COUGH PO) Take 1 tablet by mouth every 6 (six) hours as needed (cold).   . Fluticasone-Salmeterol (ADVAIR) 100-50 MCG/DOSE AEPB Inhale 1 puff into the lungs 2 (two) times daily.  Marland Kitchen guaiFENesin 200 MG tablet Take 400 mg by mouth every 4 (four) hours as needed for cough or to loosen phlegm.  . hydrocortisone cream 0.5 % Apply 1 application topically 2 (two) times daily. (Patient taking differently: Apply 1 application topically 2 (two) times daily as needed for itching. )  . triamcinolone (KENALOG) 0.025 % ointment Apply 1 application  topically 2 (two) times daily. (Patient taking differently: Apply 1 application topically 2 (two) times daily as needed (dermatitis). )  . [DISCONTINUED] amLODipine (NORVASC) 10 MG tablet Take 1 tablet (10 mg total) by mouth daily.  . [DISCONTINUED] carvedilol (COREG) 25 MG tablet Take 1 tablet (25 mg total) by mouth 2 (two) times daily with a meal.  . [DISCONTINUED] furosemide (LASIX) 20 MG tablet Take 1 tablet (20 mg total) by mouth 2 (two) times daily.  . [DISCONTINUED] lisinopril (PRINIVIL,ZESTRIL) 40 MG tablet Take 1 tablet (40 mg total) by  mouth daily. Needs office visit for refills     Allergies: Allergies as of 06/28/2016 - Review Complete 06/28/2016  Allergen Reaction Noted  . Periactin [cyproheptadine] Swelling 09/23/2014   Past Medical History:  Diagnosis Date  . COPD (chronic obstructive pulmonary disease) (HCC) Dx 2013  . HTN (hypertension) 08/30/2013  . Hypertension     Family History: None provided by the patient  Social History: As noted in the history of present illness  Review of Systems: A complete ROS was negative except as per HPI.   Physical Exam: Blood pressure 133/71, pulse (!) 56, temperature 98.2 F (36.8 C), temperature source Oral, resp. rate 18, height 5\' 5"  (1.651 m), weight 219 lb (99.3 kg), SpO2 93 %.  Physical Exam  Constitutional: No distress.  HENT:  Head: Normocephalic and atraumatic.  Eyes: Conjunctivae are normal. No scleral icterus.  Cardiovascular: Normal rate and regular rhythm.   Pulmonary/Chest: Effort normal. No respiratory distress.  Abdominal: Soft. Bowel sounds are normal.  Musculoskeletal: She exhibits tenderness.  Left lower extremity deep with 2+ pitting edema as compared to 1+ pitting on the right. Left foot with extreme tenderness to light touch and swelling. Dorsalis pedis pulse 2+ bilaterally.   Skin: She is not diaphoretic.           EKG: Right axis deviation with T wave inversions in III, avF, V5, V6.  CXR: Central vascular congestion, aortic atherosclerosis  Assessment & Plan by Problem:  [name] is a 60 year old female with hypertension, previously diagnosed congestive heart failure, COPD who presents emergency department for persistent left foot pain x 4 days.   Hypoxia: Oxygen requirement is new for her. Chest x-ray does show central vascular congestion which raises concern for worsening cardiac output as a consequence of progressive congestive heart failure or pulmonary hypertension, but BNP is less than 100. Low suspicion for infection at  this point in the absence of an overt infiltrate and leukocytosis. Given the unilateral lower certainly swelling, DVT would also be in the differential though findings were unremarkable no lower extremity Dopplers. -Check d-dimer and order CTA if elevated -Check echocardiogram to assess cardiac function -Give Lasix 40 mg IV if CT is negative for PE as diuresis would worsen cardiac output in the setting of a PE -Follow BMET tomorrow to monitor renal function  Left foot pain: The absence of overt trauma or injury, I wonder she has an atypical presentation of gouty monoarthritis though she does not prior history of it. Central venous insufficiency could be contributory.  -Consider Toradol 30mg  IV if she does not need CTA to avoid renal injury  COPD: No PFTs on file to review. -Continue Dulera 2 puffs twice daily and albuterol nebulizers every 6 hours as needed for wheezing.  #FEN:  -Diet: Heart Healthy  #DVT prophylaxis: Lovenox  #CODE STATUS: FULL CODE -Confirmed with patient on admission  Dispo: Admit patient to Inpatient with expected length of stay greater than  2 midnights.  Signed: Beather Arbour, MD 06/29/2016, 7:56 PM  Pager: @[phone] @

## 2016-06-29 NOTE — ED Notes (Signed)
This RN and [place] RN attempted to ambulate patient with walker. Patient needed 2 people assist to stand. Patient unable to take a step. PA made aware.

## 2016-06-29 NOTE — ED Provider Notes (Addendum)
TIME SEEN:  By signing my name below, I, Arianna Nassar, attest that this documentation has been prepared under the direction and in the presence of [employer], DO.  Electronically Signed: Octavia Heir, ED Scribe. 06/29/16. 2:19 AM.   CHIEF COMPLAINT:  Chief Complaint  Patient presents with  . Foot Swelling     HPI:  HPI Comments: Shari Obrien is a 60 y.o. female who has a PMhx of HTN, COPD, and CHF presents to the Emergency Department complaining of sudden onset, gradual worsening, bilateral feet swelling with left greater than right onset 4 days ago. She reports associated pain in her left foot. She notes her feet normally swelling when she has them down for a long period of time. Pt is unable to bear weight on her left leg because of pain in the left foot. She notes soaking her feet in epsom salts and rubbing them in alcohol to alleviate the swelling with no relief. Pt denies fever, hx of blood clot, hx of DM, hx of HIV, chest pain, shortness of breath, vomiting, diarrhea.  No injury to the leg.  Patient is extremely hypertensive in the emergency department. Reports she is on blood pressure medication and reports compliance. States she took her medications today.  ROS: See HPI Constitutional: no fever  Eyes: no drainage  ENT: no runny nose   Cardiovascular:  no chest pain  Resp: no SOB  GI: no vomiting GU: no dysuria Integumentary: no rash  Allergy: no hives  Musculoskeletal: Bilateral foot swelling  Neurological: no slurred speech ROS otherwise negative  PAST MEDICAL HISTORY/PAST SURGICAL HISTORY:  Past Medical History:  Diagnosis Date  . COPD (chronic obstructive pulmonary disease) (HCC) Dx 2013  . HTN (hypertension) 08/30/2013  . Hypertension     MEDICATIONS:  Prior to Admission medications   Medication Sig Start Date End Date Taking? Authorizing Provider  albuterol (PROVENTIL HFA;VENTOLIN HFA) 108 (90 Base) MCG/ACT inhaler Inhale 2 puffs into the lungs every  6 (six) hours as needed for wheezing or shortness of breath. 02/02/16   Quentin Angst, MD  amLODipine (NORVASC) 10 MG tablet Take 1 tablet (10 mg total) by mouth daily. 01/19/16   Ambrose Finland, NP  carvedilol (COREG) 25 MG tablet Take 1 tablet (25 mg total) by mouth 2 (two) times daily with a meal. 01/19/16   Ambrose Finland, NP  cetirizine (ZYRTEC) 10 MG tablet Take 10 mg by mouth daily.    Historical Provider, MD  Dextromethorphan-Guaifenesin (CORICIDIN HBP CONGESTION/COUGH PO) Take 1 tablet by mouth every 6 (six) hours as needed.     Historical Provider, MD  diclofenac sodium (VOLTAREN) 1 % GEL Apply 2 g topically 4 (four) times daily. 09/30/14   Ambrose Finland, NP  Fluticasone-Salmeterol (ADVAIR) 100-50 MCG/DOSE AEPB Inhale 1 puff into the lungs 2 (two) times daily. 02/02/16   Quentin Angst, MD  furosemide (LASIX) 20 MG tablet Take 1 tablet (20 mg total) by mouth 2 (two) times daily. 01/19/16   Ambrose Finland, NP  hydrocortisone cream 0.5 % Apply 1 application topically 2 (two) times daily. 01/19/16   Ambrose Finland, NP  lisinopril (PRINIVIL,ZESTRIL) 40 MG tablet Take 1 tablet (40 mg total) by mouth daily. Needs office visit for refills 05/17/16   Quentin Angst, MD  triamcinolone (KENALOG) 0.025 % ointment Apply 1 application topically 2 (two) times daily. 01/20/16   Ambrose Finland, NP  Vitamin D, Ergocalciferol, (DRISDOL) 50000 units CAPS capsule Take 1 capsule (  50,000 Units total) by mouth every 7 (seven) days. Patient not taking: Reported on 02/02/2016 01/27/16   Ambrose FinlandValerie A Keck, NP    ALLERGIES:  Allergies  Allergen Reactions  . Periactin [Cyproheptadine] Swelling    SOCIAL HISTORY:  Social History  Substance Use Topics  . Smoking status: Current Every Day Smoker    Types: Cigarettes  . Smokeless tobacco: Never Used  . Alcohol use Not on file    FAMILY HISTORY: History reviewed. No pertinent family history.  EXAM: BP (!) 203/89 (BP Location: Left Arm) Comment: RN  notified   Pulse 79   Temp 98.2 F (36.8 C) (Oral)   Resp 20   Ht 5\' 6"  (1.676 m)   Wt 185 lb (83.9 kg)   SpO2 94%   BMI 29.86 kg/m  CONSTITUTIONAL: Alert and oriented and responds appropriately to questions. Chronically ill-appearing, appears uncomfortable, afebrile and nontoxic HEAD: Normocephalic EYES: Conjunctivae clear, PERRL ENT: normal nose; no rhinorrhea; moist mucous membranes NECK: Supple, no meningismus, no LAD  CARD: RRR; S1 and S2 appreciated; no murmurs, no clicks, no rubs, no gallops RESP: Normal chest excursion without splinting or tachypnea; breath sounds clear and equal bilaterally; no wheezes, no rhonchi, no rales, no hypoxia or respiratory distress, speaking full sentences ABD/GI: Normal bowel sounds; non-distended; soft, non-tender, no rebound, no guarding, no peritoneal signs BACK:  The back appears normal and is non-tender to palpation, there is no CVA tenderness EXT: Patient has swelling in bilateral feet worse in the left foot than the right with nonpitting edema. The left foot is warm and erythematous diffusely over the dorsal foot. There is a healing wound to the left fifth toe with no drainage. No induration or fluctuance. 2+ DP pulses bilaterally. No joint effusion. Compartments are soft. No bony deformity. Normal ROM in all joints; otherwise external these are non-tender to palpation; no edema; normal capillary refill; no cyanosis, no calf tenderness or swelling    SKIN: Normal color for age and race; warm; no rash NEURO: Moves all extremities equally, sensation to light touch intact diffusely, cranial nerves II through XII intact PSYCH: The patient's mood and manner are appropriate. Grooming and personal hygiene are appropriate.  MEDICAL DECISION MAKING: Patient here with bilateral foot swelling. Much worse on the left side. She appears to have a cellulitis of the left foot. No calf tenderness or swelling but DVT is also on the differential. Denies history of  any chest pain or shortness of breath. She is very hypertensive which may be secondary to pain. We'll give Dilaudid for pain, vancomycin for infection. We'll obtain labs. We'll also x-ray her foot to evaluate for any signs of osteomyelitis or acute fracture. Will give IV hydralazine for her hypertension.  ED PROGRESS: 5:00 AM  Patient's labs are unremarkable. X-ray shows no acute injury or sign of osteomyelitis. Blood pressure has improved. She did state to nursing staff that she does not take her blood pressure medications as she should. She is on Lasix, amlodipine, carvedilol and lisinopril. We'll give her dose of her home medications currently. She is still complaining of pain but states it is improved. Will give another dose of Dilaudid. Plan is to obtain a venous Doppler in the morning to rule out DVT.  8:00 AM  Pt's BP is stable.  She has been resting comfortably. Plan is to obtain DVT this morning and likely discharge with a row without anticoagulation depending on results. We'll discharge with pain medication as well as refills of her blood  pressure medication. Will have her follow-up with her PCP, Dr. Luna Glasgow.  Oncoming Ed provider Burna Forts PA to follow up in venous doppler of LLE.   I reviewed all nursing notes, vitals, pertinent old records, EKGs, labs, imaging (as available).    I personally performed the services described in this documentation, which was scribed in my presence. The recorded information has been reviewed and is accurate.    Layla Maw Zayla Agar, DO 06/29/16 0757    Layla Maw Houda Brau, DO 06/29/16 [phone]

## 2016-06-29 NOTE — ED Provider Notes (Signed)
   Patient care signed out to me at shift change by Rochele RaringKristen Ward M.D. pending ultrasound. Preliminary reading by ultrasound tech shows no signs of DVT. Patient has been in the emergency room approximately 11 hours, at this point she will be discharged with previously printed instructions from Dr. Elesa MassedWard, if radiologist read anything different than the tach patient will be informed.  At the time of discharge pt reports worsening pain. I ordered walker for her. Pt having difficulty placing weight on foot. Pt has not had any pain medication in over 6 hours. She will be given a dose her and re-evaluated. I discussed hospital admission with patient if she is unable to ambulate, she refused to discuss this further and would like to go home. Upon further evaluation patient has crackles on lung exam, oxygen dropping into the upper 80s while on 3 L. Patient does have history of smoking, asthma COPD. Echo in 2009 shows diastolic dysfunction. Patient reports that she's had bilateral lower extremity edema over the last 4-5 days.  Patient again unable to ambulate due to pain in her left foot. Chest x-ray shows interstitial opacities with central vascular congestion suggesting developing CHF/edema, cannot rule out atypical infection. Low suspicion for atypical infection as patient has lower extremity swelling or edema, is afebrile with no abnormal white count. Patient continues to deny any chest pain.  Hospital service consult for admission.  Vitals:   06/29/16 1245 06/29/16 1300 06/29/16 1315 06/29/16 1330  BP: 138/82 134/65 134/74 136/84  Pulse: 66 64 62 68  Resp:    14  Temp:      TempSrc:      SpO2: 91% 92% 93% 91%  Weight:      Height:           Eyvonne MechanicJeffrey Havoc Sanluis, PA-C 06/29/16 1351    Cathren LaineKevin Steinl, MD 06/30/16 [phone]

## 2016-06-29 NOTE — H&P (Signed)
Date: 06/29/2016               Patient Name:  Shari Obrien MRN: [id number]  DOB: [birth year] Age / Sex: 60 y.o., female   PCP: Lance Bosch, NP              Medical Service: Internal Medicine Teaching Service              Attending Physician: Dr. Bartholomew Crews, MD    First Contact: Pershing Cox, MS4 Pager: [phone]  Second Contact: Dr. Charlott Rakes Pager: [phone]            After Hours (After 5p/  First Contact Pager: [phone]  weekends / holidays): Second Contact Pager: [phone]   Chief Complaint: left foot pain  History of Present Illness: Shari Obrien is a 60 year old female with a past medical history of COPD, asthma, hypertension, and congestive heart failure (normal EF with diastolic dysfunction on 3254 echocardiogram) who presented to the [facility] Emergency Department on 8/8 with sudden worsening of left foot pain. She states that she has chronic swelling in both legs and feet for which she takes lasix. About 4 days ago, she noticed the swelling had worsened and was accompanied by mild pain. She soaked her feet in epsom salts and used a foot massager with some relief of the pain. Over the next few days, the swelling in her right leg improved but her left leg remained swollen. Last night while lying in bed, she developed an acute worsening of pain in the left foot, primarily along the dorsum of the foot and radiating upwards. She describes the pain as "sharp" and she is unable to walk on that foot. This pain brought her to the ED.  Shari Obrien endorses a recent scrape to her left foot but denies other trauma. She has never experienced pain like this before. She denies chest pain, recent travel, and shortness of breath above baseline. She endorses shortness of breath exacerbated by lying flat and seasonal allergies. She uses her albuterol inhaler once a day. She denies chest pain, fever, chills, nausea, and vomiting. She has had difficulty getting her medications but states she  should be able to refill them regularly now that she has a new job. Her new job requires her to be on her feet for long periods of time, but she endorses no difference in LE swelling since starting.  In the Emergency Department, chest xray revealed interstitial opacities with central vascular congestion suggestive of CHF, pulmonary hypertension, or atypical pneumonia. Preliminary labs revealed no significant abnormalities on CBC or BMP. BNP was 78. Lower extremity dopplers showed no sign of DVT and an xray of the left foot showed no evidence of fracture or dislocation. She received 1 dose of vancomycin and 3 doses of '1mg'$  dilaudid for pain. She was evaluated by the Internal Medicine Teaching team and admitted to the floor for further work-up of her hypoxia, LE edema, and left foot pain.  Meds: Albuterol inhaler 2 puffs q6h prn Amlodipine 10 mg qday Carvedilol 25 mg BID Coricidin HBP 1 tablet q6h prn Advair 100-50 mcg 1 puff BID Lisinopril 40 mg qday Lasix 20 mg BID  Allergies: Allergies as of 06/28/2016 - Review Complete 06/28/2016  Allergen Reaction Noted  . Periactin [cyproheptadine] Swelling 09/23/2014   Past Medical History:  Diagnosis Date  . COPD (chronic obstructive pulmonary disease) ([facility]) Dx 2013  . HTN (hypertension) 08/30/2013  . Hypertension    Past Surgical History:  Abdominal Hysterectomy Back surgery  Social History: Shari Obrien lives alone in an apartment in [place]. She has historically smoked 1 pack per day but has recently decreased to 2/3 of a pack due to smoking restrictions at work. 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 She denies alcohol or illicit drug use.  Review of Systems: Pertinent items noted in HPI and remainder of comprehensive ROS otherwise negative.  Physical Exam: Blood pressure 144/83, pulse 61, temperature 98 F (36.7 C), temperature source Oral, resp. rate 14, height '5\' 6"'$  (1.676 m), weight 83.9 kg (185 lb), SpO2 90 %. BP  144/83   Pulse 61   Temp 98 F (36.7 C) (Oral)   Resp 14   Ht '5\' 6"'$  (1.676 m)   Wt 83.9 kg (185 lb)   SpO2 90%   BMI 29.86 kg/m  General appearance: awake and alert, moderately obese African American women in NAD. Head: Normocephalic, atraumatic. Non-icteric sclera. PERRL. EOMI. Nares clear with nasal cannula in place. Oropharynx clear, MMM. Lungs: breathing comfortably on 2 L O2 nasal cannula. Diffuse crackles bilaterally in all lung fields.  Heart: irregular rhythm, regular rate.S1, S2. No murmurs, rubs, or gallops. Abdomen: BS +.  Extremities: Warm and dry. 1+ pitting edema LLE, trace in RLE. Scattered bruises in both LE's but no lesions otherwise noted. Dorsum of left foot painful to touch with no additional warmth, some mild erythema noted near 1st MTH. Pulses 2+DP bilat.  Lab results: Component     Latest Ref Rng & Units 06/29/2016  WBC     4.0 - 10.5 K/uL 6.7  RBC     3.87 - 5.11 MIL/uL 6.51 (H)  Hemoglobin     12.0 - 15.0 g/dL 19.4 (H)  HCT     36.0 - 46.0 % 59.2 (H)  MCV     78.0 - 100.0 fL 90.9  MCH     26.0 - 34.0 pg 29.8  MCHC     30.0 - 36.0 g/dL 32.8  RDW     11.5 - 15.5 % 14.6  Platelets     150 - 400 K/uL 188  Neutrophils     % 70  NEUT#     1.7 - 7.7 K/uL 4.6  Lymphocytes     % 18  Lymphocyte #     0.7 - 4.0 K/uL 1.2  Monocytes Relative     % 12  Monocyte #     0.1 - 1.0 K/uL 0.8  Eosinophil     % 1  Eosinophils Absolute     0.0 - 0.7 K/uL 0.0  Basophil     % 1  Basophils Absolute     0.0 - 0.1 K/uL 0.0  Sodium     135 - 145 mmol/L 140  Potassium     3.5 - 5.1 mmol/L 3.8  Chloride     101 - 111 mmol/L 102  CO2     22 - 32 mmol/L 28  Glucose     65 - 99 mg/dL 122 (H)  BUN     6 - 20 mg/dL 14  Creatinine     0.44 - 1.00 mg/dL 0.76  Calcium     8.9 - 10.3 mg/dL 8.8 (L)  EGFR (Non-African Amer.)     >60 mL/min >60  EGFR (African American)     >60 mL/min >60  Anion gap     5 - 15 10  B Natriuretic Peptide     0.0 - 100.0 pg/mL  78.8  Troponin I     <0.03 ng/mL <0.03    Imaging results:  Dg Chest 2 View  Result Date: 06/29/2016 CLINICAL DATA:  60 year old female with a history of shortness of breath EXAM: CHEST  2 VIEW COMPARISON:  09/01/2009 FINDINGS: Low lung volumes accentuates the interstitium and central vasculature. Calcifications of the aorta. Fullness in the central vasculature with peribronchial thickening. Interlobular septal thickening. Cardiomediastinal silhouettes unchanged from the comparison. No pleural effusion or pneumothorax. IMPRESSION: Pattern of interstitial opacities with central vascular congestion suggesting developing CHF/edema, although atypical infection not excluded. Aortic atherosclerosis. Signed, Dulcy Fanny. Earleen Newport, DO [facility] Electronically Signed   By: Corrie Mckusick D.O.   On: 06/29/2016 12:45   Dg Foot Complete Left  Result Date: 06/29/2016 CLINICAL DATA:  Acute onset of left foot pain and swelling. Initial encounter. EXAM: LEFT FOOT - COMPLETE 3+ VIEW COMPARISON:  None. FINDINGS: There is no evidence of fracture or dislocation. The joint spaces are preserved. There is no evidence of talar subluxation; the subtalar joint is unremarkable in appearance. Plantar and posterior calcaneal spurs are noted. Widening of the calcaneocuboid distance may reflect prior resection. Diffuse soft tissue swelling is noted about the foot and ankle. IMPRESSION: No evidence of fracture or dislocation. Widening of the calcaneocuboid distance may reflect prior resection; would correlate clinically. Electronically Signed   By: Garald Balding M.D.   On: 06/29/2016 03:40    Other results: EKG: irregular rhythm (?), right axis deviation, nonspecific T wave abnormality  Assessment & Plan by Problem: Ms. Hargett is a 60 yo female with a PMHx significant for COPD, CHF, and HTN who presents with bilateral lower extremity edema (L>R), hypoxia, and painful left  foot.  Lower extremity edema, hypoxia: New oxygen requirement in ED with O2 sats in low 90's on 2 L. Chest x ray shows central vascular congestion suggestive of CHF or pulmonary hypertension. Ms. Nolte has known CHF and COPD but does endorse missed rx refills when she did not have insurance. Low suspicion for pneumonia given patient is afebrile with normal WBC. Need to r/o PE since LE swelling is non-symmetric and LLE painful to touch; while LE dopplers revealed no obvious thrombus, D-dimer elevated at 1.64.    --CTA pending   --echocardiogram pending   --continue cardiac monitoring   --hold home lasix until PE ruled out  Left foot pain: extremely painful to the touch and focused over dorsum of foot, though patient states pain radiates up leg. No significant trauma other than recent small scrape, and xray shows no fracture or dislocation. Low suspicion for cellulitis given normal WBC, afebrile, very mild erythema. Consider gout given foot is very painful and erythema localized to 1st MTH.    --reassess after we have ruled out PE, evaluated heart function with echo   --if continue to suspect gout, will evaluate for joint effusion and perform arthrocentesis. Consider starting NSAIDs, colchicine, or intra-articular glucocorticoids.  Congestive Heart Failure: Most recent echocardiogram in 7096 shows diastolic dysfunction. Bilateral LE pitting edema and 2-pillow orthopnea concerning for decompensation. BNP 78. Will obtain echocardiogram this admission.    --echocardiogram pending   --hold lasix until PE ruled out  COPD: Continue albuterol q6h prn, Dulera 2 puffs BID.  Asthma: Albuterol and Dulera  Hypertension: Hypertensive on arrival and received 1 dose iv hydralazine. Currently normotensive, HR 50's. Hold home amlodipine, carvedilol, and lisinopril.  FEN/GI: NPO for CTA. Can transition to [place] when appropriate.  DVT  prophylaxis: Lovenox  Code Status: FULL CODE  Disposition:  Admit to inpatient. Anticipate stay of 5-7 days.  This is a Careers information officer Note.  The care of the patient was discussed with Dr. Posey Pronto and the assessment and plan was formulated with their assistance.  Please see their note for official documentation of the patient encounter.   Signed: Pershing Cox, Medical Student 06/29/2016, 3:15 PM

## 2016-06-29 NOTE — Progress Notes (Signed)
Preliminary results by tech - Left Lower Ext. Venous Duplex Completed. Negative for deep and superficial vein thrombosis in the left leg. Renardo Cheatum, BS, RDMS, RVT  

## 2016-06-29 NOTE — Progress Notes (Signed)
New pt admission from ED. Pt brought to the floor in stable condition. Vitals taken. Initial Assessment done. All immediate pertinent needs to patient addressed. Patient Guide given to patient. Important safety instructions relating to hospitalization reviewed with patient. Patient verbalized understanding. Will continue to monitor pt.  Tecumseh Yeagley RN 

## 2016-06-30 ENCOUNTER — Encounter (HOSPITAL_COMMUNITY): Payer: Self-pay | Admitting: General Practice

## 2016-06-30 ENCOUNTER — Inpatient Hospital Stay (HOSPITAL_COMMUNITY): Payer: Self-pay

## 2016-06-30 DIAGNOSIS — J9601 Acute respiratory failure with hypoxia: Secondary | ICD-10-CM

## 2016-06-30 DIAGNOSIS — R0600 Dyspnea, unspecified: Secondary | ICD-10-CM

## 2016-06-30 DIAGNOSIS — F17200 Nicotine dependence, unspecified, uncomplicated: Secondary | ICD-10-CM

## 2016-06-30 DIAGNOSIS — D751 Secondary polycythemia: Secondary | ICD-10-CM

## 2016-06-30 DIAGNOSIS — M79672 Pain in left foot: Secondary | ICD-10-CM

## 2016-06-30 DIAGNOSIS — J189 Pneumonia, unspecified organism: Secondary | ICD-10-CM | POA: Diagnosis present

## 2016-06-30 DIAGNOSIS — M79671 Pain in right foot: Secondary | ICD-10-CM

## 2016-06-30 DIAGNOSIS — J449 Chronic obstructive pulmonary disease, unspecified: Secondary | ICD-10-CM

## 2016-06-30 LAB — CBC
HEMATOCRIT: 56.4 % — AB (ref 36.0–46.0)
Hemoglobin: 18.1 g/dL — ABNORMAL HIGH (ref 12.0–15.0)
MCH: 30 pg (ref 26.0–34.0)
MCHC: 32.1 g/dL (ref 30.0–36.0)
MCV: 93.5 fL (ref 78.0–100.0)
Platelets: 190 10*3/uL (ref 150–400)
RBC: 6.03 MIL/uL — AB (ref 3.87–5.11)
RDW: 14.9 % (ref 11.5–15.5)
WBC: 6.7 10*3/uL (ref 4.0–10.5)

## 2016-06-30 LAB — BASIC METABOLIC PANEL
ANION GAP: 8 (ref 5–15)
BUN: 26 mg/dL — ABNORMAL HIGH (ref 6–20)
CHLORIDE: 99 mmol/L — AB (ref 101–111)
CO2: 32 mmol/L (ref 22–32)
Calcium: 8.5 mg/dL — ABNORMAL LOW (ref 8.9–10.3)
Creatinine, Ser: 1.04 mg/dL — ABNORMAL HIGH (ref 0.44–1.00)
GFR calc Af Amer: >60 mL/min (ref >60)
GFR calc non Af Amer: 57 mL/min — ABNORMAL LOW (ref >60)
Glucose, Bld: 88 mg/dL (ref 65–99)
POTASSIUM: 4.2 mmol/L (ref 3.5–5.1)
Sodium: 139 mmol/L (ref 135–145)

## 2016-06-30 LAB — CARBOXYHEMOGLOBIN
Carboxyhemoglobin: 2 % — ABNORMAL HIGH (ref 0.5–1.5)
METHEMOGLOBIN: 0.8 % (ref 0.0–1.5)
O2 Saturation: 61.7 %
Total hemoglobin: 17.7 g/dL — ABNORMAL HIGH (ref 12.0–16.0)

## 2016-06-30 LAB — ECHOCARDIOGRAM COMPLETE
Height: 65 in
Weight: 3520 oz

## 2016-06-30 LAB — TSH: TSH: 0.418 u[IU]/mL (ref 0.350–4.500)

## 2016-06-30 MED ORDER — AZITHROMYCIN 250 MG PO TABS
250.0000 mg | ORAL_TABLET | Freq: Every day | ORAL | Status: DC
  Administered 2016-07-01 – 2016-07-03 (×3): 250 mg via ORAL
  Filled 2016-06-30 (×3): qty 1

## 2016-06-30 MED ORDER — AZITHROMYCIN 500 MG PO TABS
500.0000 mg | ORAL_TABLET | Freq: Every day | ORAL | Status: AC
  Administered 2016-06-30: 500 mg via ORAL
  Filled 2016-06-30: qty 1

## 2016-06-30 MED ORDER — ACETAMINOPHEN 325 MG PO TABS
650.0000 mg | ORAL_TABLET | Freq: Four times a day (QID) | ORAL | Status: DC | PRN
  Administered 2016-06-30 – 2016-07-01 (×2): 650 mg via ORAL
  Filled 2016-06-30 (×2): qty 2

## 2016-06-30 MED ORDER — FUROSEMIDE 10 MG/ML IJ SOLN
40.0000 mg | Freq: Once | INTRAMUSCULAR | Status: AC
  Administered 2016-06-30: 40 mg via INTRAVENOUS
  Filled 2016-06-30: qty 4

## 2016-06-30 NOTE — Progress Notes (Signed)
Echocardiogram 2D Echocardiogram has been performed.  Shari Obrien 06/30/2016, 7:59 AM

## 2016-06-30 NOTE — Evaluation (Signed)
Physical Therapy Evaluation Patient Details Name: Shari Obrien MRN: [id number] DOB: 25-Feb-1956 Today's Date: 06/30/2016   History of Present Illness  Pt is a 60 y/o female presenting with a sudden worsening of L foot pain with edema and pain in bilateral feet. PMH including but not limited to COPD, CHF and HTN.  Clinical Impression  Pt presented supine in bed with HOB elevated, awake and willing to participate in therapy session. Pt reported having pain with WB; however, therapist applied pressure to pt's heels bilaterally and pt reported no pain. When therapist applied pressure to patient's forefoot, pt withdrew foot and noted pain. Also of note, pt with mild pitting edema on dorsal aspect of bilateral forefeet and pain with palpation. Pt was able to stand and ambulate approximately 25 ft with RW and when instructed to WB mostly through heels. Pt was able to successfully do this and expressed excitement in being able to stand and walk again. Pt was able to stand at sink in her room for approximately 5 minutes with min guard to brush her teeth. Pt would continue to benefit from skilled physical therapy services at this time while admitted to address her below listed limitations in order to improve her overall safety and independence with functional mobility.      Follow Up Recommendations No PT follow up;Supervision for mobility/OOB    Equipment Recommendations  Rolling walker with 5" wheels    Recommendations for Other Services       Precautions / Restrictions Precautions Precautions: Fall Restrictions Weight Bearing Restrictions: No      Mobility  Bed Mobility Overal bed mobility: Needs Assistance Bed Mobility: Supine to Sit;Sit to Supine     Supine to sit: Min guard Sit to supine: Min assist   General bed mobility comments: pt required increased time and min A with bilateral LEs with return to supine  Transfers Overall transfer level: Needs assistance Equipment used:  Rolling walker (2 wheeled) Transfers: Sit to/from Stand Sit to Stand: Min assist         General transfer comment: pt required increased time, VC'ing for bilateral hand positioning and min A with stability and to recover with LOB x2 upon standing  Ambulation/Gait Ambulation/Gait assistance: Min guard Ambulation Distance (Feet): 25 Feet Assistive device: Rolling walker (2 wheeled) Gait Pattern/deviations: Step-through pattern;Decreased step length - right;Decreased step length - left;Decreased stride length Gait velocity: decreased Gait velocity interpretation: Below normal speed for age/gender    Stairs            Wheelchair Mobility    Modified Rankin (Stroke Patients Only)       Balance Overall balance assessment: Needs assistance Sitting-balance support: Feet supported;No upper extremity supported Sitting balance-Leahy Scale: Fair     Standing balance support: During functional activity;Single extremity supported Standing balance-Leahy Scale: Poor Standing balance comment: pt reliant on RW for stability in standing; pt stood at sink to brush teeth for several minutes but rested her forearms on sink during task.                             Pertinent Vitals/Pain Pain Assessment: Faces Faces Pain Scale: Hurts little more Pain Location: dorsal aspect of bilateral feet with WB on forefoot Pain Descriptors / Indicators: Discomfort;Grimacing Pain Intervention(s): Monitored during session;Repositioned    Home Living Family/patient expects to be discharged to:: Private residence Living Arrangements: Alone Available Help at Discharge: Other (Comment) (None) Type of Home: Apartment Home  Access: Level entry     Home Layout: One level Home Equipment: None      Prior Function Level of Independence: Independent               Hand Dominance        Extremity/Trunk Assessment   Upper Extremity Assessment: Overall WFL for tasks assessed            Lower Extremity Assessment: Overall WFL for tasks assessed      Cervical / Trunk Assessment: Normal  Communication   Communication: No difficulties  Cognition Arousal/Alertness: Awake/alert Behavior During Therapy: WFL for tasks assessed/performed Overall Cognitive Status: No family/caregiver present to determine baseline cognitive functioning                      General Comments General comments (skin integrity, edema, etc.): pt with mildly pitting edema on the dorsal aspect of bilateral forefeet    Exercises Total Joint Exercises Ankle Circles/Pumps: AROM;Both;10 reps      Assessment/Plan    PT Assessment Patient needs continued PT services  PT Diagnosis Difficulty walking;Acute pain   PT Problem List Decreased strength;Decreased range of motion;Decreased activity tolerance;Decreased balance;Decreased mobility;Decreased knowledge of use of DME;Pain  PT Treatment Interventions DME instruction;Gait training;Functional mobility training;Therapeutic activities;Therapeutic exercise;Balance training;Patient/family education   PT Goals (Current goals can be found in the Care Plan section) Acute Rehab PT Goals Patient Stated Goal: return home PT Goal Formulation: With patient Time For Goal Achievement: 07/07/16 Potential to Achieve Goals: Good    Frequency Min 3X/week   Barriers to discharge Decreased caregiver support;Other (comment) (pt reported having no one to assist her at home) pt reported no friends, family or caregivers that are able to assist her when she discharges from the hospital    Co-evaluation               End of Session Equipment Utilized During Treatment: Gait belt Activity Tolerance: Patient limited by pain Patient left: in bed;with call bell/phone within reach Nurse Communication: Mobility status         Time: 4403-4742 PT Time Calculation (min) (ACUTE ONLY): 36 min   Charges:   PT Evaluation $PT Eval Moderate Complexity:  1 Procedure PT Treatments $Gait Training: 8-22 mins   PT G CodesAlessandra Bevels Ciel Yanes 06/30/2016, 5:32 PM Deborah Chalk, PT, DPT [phone]

## 2016-06-30 NOTE — Progress Notes (Signed)
Subjective: Shari Obrien endorses continued pain in her left foot that is now present and similar in her right foot, radiating up into her calves. Toradol helped with the pain last night but she woke up in pain this morning. She denies shortness of breath but endorses increased sputum production. Sputum is usually clear or white but is now green in appearance. She denies fever, chills, and cough. Also denies any itching or burning associated with the foot pain. Denies any redness or itching in her feet or hands after bathing. The pain in her feet is worsened by standing and walking and she sometimes has to take a break to lift and stretch her feet.  Objective: Vital signs in last 24 hours: Vitals:   06/29/16 2025 06/29/16 2100 06/30/16 0203 06/30/16 0456  BP:  131/61 (!) 142/69 139/65  Pulse:  67 70 78  Resp:  '18 18 18  '$ Temp:  97.9 F (36.6 C) 98.8 F (37.1 C) 99 F (37.2 C)  TempSrc:  Oral Oral Oral  SpO2: 93% 90% 93% 90%  Weight:    99.8 kg (220 lb)  Height:       Weight change: 15.4 kg (34 lb)  Intake/Output Summary (Last 24 hours) at 06/30/16 0931 Last data filed at 06/30/16 0300  Gross per 24 hour  Intake              240 ml  Output              625 ml  Net             -385 ml   Physical Exam: General appearance: awake and alert, moderately obese African American women in NAD. Lungs: breathing comfortably on 2 L O2 nasal cannula. Rhonchi upper lung fields bilaterally. Heart: RRR.S1, S2. No murmurs, rubs, or gallops. Abdomen: NTND, BS +.  Extremities: Warm and dry. 2+ pitting edema LLE, 1+ in RLE. Scattered bruises and scabbing in both LE's but no lesions otherwise noted. Dorsum of both feet and ankles tender to touch and swollen. Mild erythema L dorsum and 1st MTH. Preserved ROM in 1st MT joint. Pulses 2+DP bilat.  Lab Results: Component     Latest Ref Rng & Units 06/29/2016 06/30/2016  Sodium     135 - 145 mmol/L  139  Potassium     3.5 - 5.1 mmol/L  4.2  Chloride  101 - 111 mmol/L  99 (L)  CO2     22 - 32 mmol/L  32  Glucose     65 - 99 mg/dL  88  BUN     6 - 20 mg/dL  26 (H)  Creatinine     0.44 - 1.00 mg/dL  1.04 (H)  Calcium     8.9 - 10.3 mg/dL  8.5 (L)  EGFR (Non-African Amer.)     >60 mL/min  57 (L)  EGFR (African American)     >60 mL/min  >60  Anion gap     5 - 15  8  WBC     4.0 - 10.5 K/uL  6.7  RBC     3.87 - 5.11 MIL/uL  6.03 (H)  Hemoglobin     12.0 - 15.0 g/dL  18.1 (H)  HCT     36.0 - 46.0 %  56.4 (H)  MCV     78.0 - 100.0 fL  93.5  MCH     26.0 - 34.0 pg  30.0  MCHC     30.0 - 36.0  g/dL  32.1  RDW     11.5 - 15.5 %  14.9  Platelets     150 - 400 K/uL  190  Total Protein     6.5 - 8.1 g/dL 6.5   Albumin     3.5 - 5.0 g/dL 3.3 (L)   AST     15 - 41 U/L 19   ALT     14 - 54 U/L 16   Alkaline Phosphatase     38 - 126 U/L 57   Total Bilirubin     0.3 - 1.2 mg/dL 1.5 (H)   Bilirubin, Direct     0.1 - 0.5 mg/dL 0.3   Indirect Bilirubin     0.3 - 0.9 mg/dL 1.2 (H)   D-Dimer, Quant     0.00 - 0.50 ug/mL-FEU 1.64 (H)     Studies/Results: Ct Angio Chest Pe W Or Wo Contrast  Result Date: 06/29/2016 CLINICAL DATA:  Dyspnea.  Elevated D-dimer. EXAM: CT ANGIOGRAPHY CHEST WITH CONTRAST TECHNIQUE: Multidetector CT imaging of the chest was performed using the standard protocol during bolus administration of intravenous contrast. Multiplanar CT image reconstructions and MIPs were obtained to evaluate the vascular anatomy. CONTRAST:  100 cc Isovue 370 IV COMPARISON:  Radiographs earlier this day FINDINGS: Cardiovascular: There are no filling defects within the pulmonary arteries to suggest pulmonary embolus. The thoracic aorta is normal in caliber without evidence of dissection. Mild atherosclerosis of the aortic arch. There is a conventional branching pattern from the aortic arch. Mild contrast refluxing into the hepatic veins and IVC. Mild right heart prominence. Mediastinum/Nodes: Prominent AP window lymph node measures  11 mm. Prominent bilateral hilar lymph nodes. No pericardial effusion. No mediastinal mass. The esophagus is decompressed. Lungs/Pleura: Bilateral dependent lower lobe opacities, right greater than left. Linear opacities in the lingula and right middle lobe. Minimal peripheral opacity in the dependent left upper lobe. There is bronchial thickening. Debris/ mucus in the right mainstem bronchus. No definite septal thickening. Upper Abdomen: No acute abnormality. Musculoskeletal: There are no acute or suspicious osseous abnormalities. Degenerative change in the spine. Review of the MIP images confirms the above findings. IMPRESSION: 1. No pulmonary embolus. 2. Bilateral dependent lower lobe opacities with linear opacities in the lingula and right middle lobe. There is debris/mucus in the right mainstem bronchus. Findings can be seen in the setting of aspiration. Multifocal pneumonia is an alternate consideration. 3. Contrast refluxing into the hepatic veins and IVC, suggesting right heart failure. 4. Prominent hilar and mediastinal nodes are likely reactive. Electronically Signed   By: Jeb Levering M.D.   On: 06/29/2016 22:57   Transthoracic Echocardiography  Patient:    Shari Obrien, Shari Obrien MR #:       [id number] Study Date: 06/30/2016  Left ventricle: The cavity size was normal. Systolic function was   vigorous. The estimated ejection fraction was in the range of 65%   to 70%. There was dynamic obstruction at rest, with a peak   velocity of 136 cm/sec and a peak gradient of 7 mm Hg. Wall   motion was normal; there were no regional wall motion   abnormalities. Features are consistent with a pseudonormal left   ventricular filling pattern, with concomitant abnormal relaxation   and increased filling pressure (grade 2 diastolic dysfunction). - Aortic valve: Transvalvular velocity was within the normal range.   There was no stenosis. There was no regurgitation. - Mitral valve: Transvalvular velocity  was within the normal range.   There  was no evidence for stenosis. There was no regurgitation. - Right ventricle: The cavity size was normal. Wall thickness was   normal. Systolic function was normal. - Atrial septum: No defect or patent foramen ovale was identified   by color flow Doppler. - Tricuspid valve: There was trivial regurgitation. - Pulmonary arteries: Systolic pressure was within the normal   range. PA peak pressure: 34 mm Hg (S).   Medications:  Scheduled Meds: . enoxaparin (LOVENOX) injection  40 mg Subcutaneous Q24H  . mometasone-formoterol  2 puff Inhalation BID  . pneumococcal 23 valent vaccine  0.5 mL Intramuscular Tomorrow-1000   Continuous Infusions:  PRN Meds:.albuterol, ketorolac   Assessment/Plan: Shari Obrien is a 60 yo female with a PMHx significant for COPD, CHF, and HTN who presents with hypoxia, bilateral lower extremity edema (L>R), and painful left foot.  Hypoxia: New oxygen requirement on admission, current O2 sats are in the low 90's on 2 L nasal cannula. Patient also endorsing increased "green" sputum production but no cough. Chest x ray shows central vascular congestion and potential bilateral atelectasis. Reviewed CTA with radiologist: no pulmonary embolus. bilateral lower lobe opacities and linear opacities in right middle lobe could be atelectasis but are more pronounced than on CXR and concerning for an atypical pneumonia. Echocardiogram reveals grade II diastolic dysfunction, but otherwise unremarkable with normal EF. Patient has a diagnosis of COPD but no record of PFT's. Hypoxia could be 2/2 a COPD exacerbation (increased sputum production and worsened dyspnea) but could also be explained by an isolated atypical pneumonia. In setting of CTA findings and hypoxia, we will treat with azithromycin and continue albuterol and dulera. Will hold off on steroids at this time.    --po azithromycin 500 mg today, 150 mg for 4 days   --dulera 2 puffs  bid   --albuterol 2.5 mg q6h prn   --hold home lasix: Cr bump to 1.04 from 0.76 yesterday due to iv contrast load. Crackles on exam limited to upper airway, inconsistent with pulmonary edema.  Foot pain and swelling: Patient now endorsing bilateral foot pain (dorsum on both), but seems less tender to the touch today. Swelling is bilateral (L>R). Pain radiates up her legs and is exacerbated by standing. Xray left foot shows no fracture or dislocation. Exam and CBC not concerning for cellulitis. No history of gout and no focal tenderness over the joints. Exam not concerning for arterial embolus or claudication. Venous dopplers show no DVT. Considered polycythemia vera in setting of elevated hemoglobin, but patient does not endorse classic symptoms of this disease. Also need to consider pulmonary hypertension (from COPD) as an etiology for the swelling. Patient did start new job a few days ago that requires her to be on her feet for long periods of time. This could be contributing to pain but she states she is used to prolonged standing with her other jobs. She received 15 mg iv toradol last night which relieved the pain.   --tylenol 650 mg q6h for pain (hold NSAIDs in setting of Cr increase)   --PT consult  Hypertension: Hypertensive on arrival and received 1 dose iv hydralazine. Currently normotensive. Hold home amlodipine, carvedilol, and lisinopril.  FEN/GI: Regular diet  DVT prophylaxis: Lovenox  Code Status: FULL CODE  Disposition: Admit to inpatient. Anticipate stay of 5-7 days.  This is a Careers information officer Note.  The care of the patient was discussed with Dr. Lynnae January and the assessment and plan formulated with their assistance.  Please see their attached note for  official documentation of the daily encounter.   LOS: 1 day   Pershing Cox, Medical Student 06/30/2016, 9:31 AM

## 2016-06-30 NOTE — Progress Notes (Signed)
Notified phlebotomy of orders for carboxyhemoglobin that needs to be drawn.

## 2016-06-30 NOTE — Care Management Note (Signed)
Case Management Note  Patient Details  Name: Shari Obrien MRN: [id number] Date of Birth: 12-09-1955  Subjective/Objective:           Admitted with Cellulitis         Action/Plan: [facility]; [facility] her [facility]; she works full time at a [employer] agency; she is hopeful that she can keep her job at discharge; At discharge, pt can [facility] to get [facility]   Expected Discharge Date:  07/01/16               Expected Discharge Plan:  Home/Self Care  Discharge planning Services  CM Consult Status of Service:  In process, will continue to follow  Reola MosherChandler, Chaeli Judy L, RN,MHA,BSN [phone] 06/30/2016, 12:07 PM

## 2016-06-30 NOTE — Progress Notes (Signed)
Date: 06/30/2016  Patient name: Shari Obrien  Medical record number: [id number]  Date of birth: Mar 20, 1956   I have seen and evaluated Shari Obrien and discussed their care with the Residency Team. Shari Obrien is a 60 yo female with h/o COPD (no PFt's on file) and tobacco use. She came to ED for L foot pain and swelling for 4 days. In the ED, she had a plain film of the R foot that was negative for acute cause and a negative L LE doppler. She was going to be D/C'd when it was noticed that she had hypoxia with O2 sat on 3 L in upper 80's and crackles on exam and we were consulted for admission. Today, she states the pain has now also migrated to her R foot. She notices congestion and purulent sputum. She wants to go home but is unable to walk 2/2 feet pain.  PMHx, Fam Hx, and/or Soc Hx : Works for [employer], on feet all day on concrete floors, walking.   Vitals:   06/30/16 0456 06/30/16 1200  BP: 139/65 (!) 140/57  Pulse: 78 80  Resp: 18 20  Temp: 99 F (37.2 C) 98.8 F (37.1 C)   Nad, able to speak in full sentences, upper airway noise and congestion HRRR no MRG L CTAB with some course upper airway breathe sounds ABD + BS, S/NT Ext no pre-tibial edema, edema of feet B, L>R, no sig tenderness to palpation, good DP pulses B, nl cap refill  I personally viewed her CXR images and confirmed by reading with the official read. Increased markings esp RLL.   I personally viewed her EKG and confirmed by reading with the official read. R axis, sinus, RAE, TWI inf and lateral  Team viewed and discussed pt's CT chest finding with the radiologist - no edema, possible some atypical infection, mucous pugging  I have reviewed her medical records - MRI 11/1998 showed L5/S1 herniated disk on L and appears to have had steroid injections.  Most outpt appts focus on HTN and eczema.  Assessment and Plan: I have seen and evaluated the patient as outlined above. I agree with the formulated Assessment  and Plan as detailed in the residents' note, with the following changes:   1. Hypoxia - She had EKG changes suggestive of R heart failure but her ECHO does not support this dx. After we reviewed her CXR and CT with the radiologist, an atypical PNA seems to be the most likely dx. She does not have a COPD exam so steroids are not being prescribed. Will follow her oxygen need.  2. B Feet pain and swelling - we have R/O a fracture of the L foot. Gout seems unlikely as pt not tender to palp as would be expected with gout - I was able to remove her footie on L foot without any pain. Claudication seems unlikely - good cap refill and DP pulses. No DVT on L. I doubt the edema is causing this degree of pain. On chart review, she did have HNP on L L5/S1. Most HNP only hit unilaterally - her pain is B. She does have new active job - will ask if inv bending (increased intrpresure) We will further investigate in Am whether following dermatomal pattern and consider MRI.  3. Polycythemia - she has no h/o long standing hypoxia. She is a smoker though. Increased HgB goes back as far as 2009. Will start with JAK2, epo level, and carboxyhemoglobin.  Burns Spain, MD  8/9/20173:40 PM

## 2016-07-01 DIAGNOSIS — J189 Pneumonia, unspecified organism: Secondary | ICD-10-CM

## 2016-07-01 DIAGNOSIS — I5030 Unspecified diastolic (congestive) heart failure: Secondary | ICD-10-CM

## 2016-07-01 DIAGNOSIS — I110 Hypertensive heart disease with heart failure: Principal | ICD-10-CM

## 2016-07-01 LAB — BASIC METABOLIC PANEL
Anion gap: 9 (ref 5–15)
BUN: 31 mg/dL — AB (ref 6–20)
CALCIUM: 8.6 mg/dL — AB (ref 8.9–10.3)
CHLORIDE: 101 mmol/L (ref 101–111)
CO2: 30 mmol/L (ref 22–32)
CREATININE: 0.84 mg/dL (ref 0.44–1.00)
GFR calc Af Amer: >60 mL/min (ref >60)
GFR calc non Af Amer: >60 mL/min (ref >60)
Glucose, Bld: 84 mg/dL (ref 65–99)
Potassium: 3.9 mmol/L (ref 3.5–5.1)
Sodium: 140 mmol/L (ref 135–145)

## 2016-07-01 LAB — ERYTHROPOIETIN: Erythropoietin: 15.5 m[IU]/mL (ref 2.6–18.5)

## 2016-07-01 MED ORDER — NAPROXEN 500 MG PO TABS
500.0000 mg | ORAL_TABLET | Freq: Two times a day (BID) | ORAL | Status: DC
  Administered 2016-07-01: 500 mg via ORAL
  Filled 2016-07-01 (×2): qty 1

## 2016-07-01 MED ORDER — NAPROXEN 500 MG PO TABS: 500.0000 mg | ORAL_TABLET | Freq: Two times a day (BID) | ORAL | Status: DC

## 2016-07-01 MED ORDER — AMLODIPINE BESYLATE 10 MG PO TABS
10.0000 mg | ORAL_TABLET | Freq: Every day | ORAL | Status: DC
  Administered 2016-07-01 – 2016-07-03 (×3): 10 mg via ORAL
  Filled 2016-07-01 (×4): qty 1

## 2016-07-01 NOTE — Progress Notes (Signed)
Subjective: [name] states that foot pain now localized to right ankle, dorsum of foot, and big toe. She was able to walk about 25 feet yesterday, but PT saw her this morning and she did not feel up to walking, stating right foot too painful. She is concerned about the swelling in her legs. She notices it is worse when she is out of her anti-hypertensive meds, but the swelling over the past few days is more than usual. She does not note any improvement in swelling since admission. She endorses improved breathing and less sputum production. She would like to get back to work.    Objective: Vital signs in last 24 hours: Vitals:   06/30/16 1200 06/30/16 2024 06/30/16 2145 07/01/16 0601  BP: (!) 140/57  136/63 (!) 150/74  Pulse: 80  70 70  Resp: _0 Temp: 98.8 F (37.1 C)  98.7 F (37.1 C) 98.8 F (37.1 C)  TempSrc: Oral  Oral Oral  SpO2: 91% 93% 93% 96%  Weight:    98.2 kg (216 lb 6.4 oz)  Height:       Weight change: -1.179 kg (-2 lb 9.6 oz)  Intake/Output Summary (Last 24 hours) at 07/01/16 5093 Last data filed at 07/01/16 2671  Gross per 24 hour  Intake             1655 ml  Output             1260 ml  Net              395 ml   Physical Exam: General appearance: awake and alert, moderately obese African American women in NAD. Lungs: breathing comfortably on room air. Extremities: Warm and dry. 2+ pitting edema LLE, 1+ in RLE. Scattered bruises and scabbing in both LE's but no lesions otherwise noted. Dorsum and ankle of right foot tender to touch. Dorsiflexion/extension preserved in L foot but unable to perform these movements in right foot due to pain. Pulses 2+ bilat.  Lab Results: Component     Latest Ref Rng & Units 06/30/2016 07/01/2016  Sodium     135 - 145 mmol/L  140  Potassium     3.5 - 5.1 mmol/L  3.9  Chloride     101 - 111 mmol/L  101  CO2     22 - 32 mmol/L  30  Glucose     65 - 99 mg/dL  84  BUN     6 - 20 mg/dL  31 (H)  Creatinine     0.44 -  1.00 mg/dL  0.84  Calcium     8.9 - 10.3 mg/dL  8.6 (L)  EGFR (Non-African Amer.)     >60 mL/min  >60  EGFR (African American)     >60 mL/min  >60  Anion gap     5 - 15  9  Total hemoglobin     12.0 - 16.0 g/dL 17.7 (H)   O2 Saturation     % 61.7   Carboxyhemoglobin     0.5 - 1.5 % 2.0 (H)   Methemoglobin     0.0 - 1.5 % 0.8   Erythropoietin     2.6 - 18.5 mIU/mL 15.5   TSH     0.350 - 4.500 uIU/mL 0.418     Studies/Results: No new results.  Medications: Scheduled Meds: . azithromycin  250 mg Oral Daily  . enoxaparin (LOVENOX) injection  40 mg Subcutaneous Q24H  . mometasone-formoterol  2  puff Inhalation BID   Continuous Infusions:  PRN Meds:.acetaminophen, albuterol   Assessment/Plan: Shari Obrien is a 60 yo female with a PMHx significant for diastolic heart dysfunction, COPD and HTN who presents with hypoxia, bilateral lower extremity edema (L>R),and painful left foot, now with resolved hypoxia, stable LE edema, and migratory foot pain.  Hypoxia: Resolved. She was mildly hypoxic on admission with O2 sats in the low 90's on 2 L nasal cannula. Patient also endorsing increased "green" sputum production but no cough. Chest x ray showed central vascular congestion and potential bilateral atelectasis. Reviewed CTA with radiologist on 8/9: no pulmonary embolus. bilateral lower lobe opacities and linear opacities in right middle lobe could be atelectasis but are more pronounced than on CXR and concerning for an atypical pneumonia. Echocardiogram 8/9 revealed grade II diastolic dysfunction, but otherwise unremarkable with normal EF. Patient has a diagnosis of COPD but no record of PFT's. Hypoxia could be 2/2 a COPD exacerbation (increased sputum production and worsened dyspnea) but could also be explained by an isolated atypical pneumonia. In setting of CTA findings and hypoxia, we will continue to treat with azithromycin and continue albuterol and dulera.                                              --po azithromycin (day 2/5). 500 mg today, 150 mg for 4 days                                             --dulera 2 puffs bid                                             --albuterol 2.5 mg q6h prn            --avoid home lasix: previously prescribed for hx of CHF with LE swelling, but lasix is not indicated for diastolic dysfunction.            --recommend outpatient PFTs to confirm COPD  Foot pain and swelling:Migratory foot pain. Now R>L with swelling bilaterally. Pain radiates up the back of her legs and into the groin area. She denies any numbness or tingling. She has a history of herniated L5 disc repair but pain is asymmetric with no other neurologic symptoms. No family or personal history of rheumatologic/autoimmune conditions. She denies rash or painful swelling in her joints (other than recent traumatic swelling in one finger). Xray left foot shows no fracture or dislocation. Exam and CBC not concerning for cellulitis. Exam not concerning for arterial embolus or claudication. Venous dopplers show no DVT. Considered polycythemia vera in setting of elevated hemoglobin, but EPO normal. Patient did start new job a few days ago that requires her to be on her feet for long periods of time. This could be contributing to pain but she states she is used to prolonged standing with her other jobs. Gout is a possibility despite non-classic presentation. We will treat for gout empirically and re-assess.                                             --  naproxen 500 mg BID for 5 days                                             --PT consulted: recommending home PT  Polycythemia: Hgb 19.9 on admission. Carboxyhemoglobin elevated to 2 with normal EPO. Likely secondary to smoking. Jak2 mutation pending.  Hypertension: Hypertensive on arrival and received 1 dose iv hydralazine. Blood pressure elevated to systolic 009'Q this morning. We will restart home amlodipine and continue to hold carvedilol and  lisinopril.  FEN/GI: Regular diet  DVT prophylaxis:Lovenox  Code Status: FULL CODE  Disposition: Admit to inpatient. Anticipate stay of 1-2 days.  This is a Careers information officer Note.  The care of the patient was discussed with Dr. Posey Pronto and the assessment and plan formulated with their assistance.  Please see their attached note for official documentation of the daily encounter.   LOS: 2 days   Pershing Cox, Medical Student 07/01/2016, 9:28 AM

## 2016-07-01 NOTE — Progress Notes (Signed)
SATURATION QUALIFICATIONS: (This note is used to comply with regulatory documentation for home oxygen)  Patient Saturations on Room Air at Rest = 92%  Patient Saturations on Room Air while Ambulating = 84%  Patient Saturations on 2 Liters of oxygen while Ambulating = 98%  Please briefly explain why patient needs home oxygen:  Pt qualifies for home O2, as her sats dropped to 84% on room air while ambulating.

## 2016-07-01 NOTE — Progress Notes (Signed)
Nutrition Education Note  RD consulted for nutrition education. Pt has history of hypertension, previously diagnosed heart failure and COPD. She is currently on a heart healthy diet.   Patient denies any nutrition related questions or concerns. States that she does "not need to be on a heart healthy diet". RD discussed the low sodium aspect of the heart healthy diet and how this helps with blood pressure and swelling/edema. Pt reports following a low sodium diet at home; she avoids processed foods and only adds salt when cooking.  Reviewed patient's dietary recall. She usually eats bran cereal with 2% milk for breakfast, peanut butter crackers while at work, and baked hamburger, chicken, or [place] for dinner with boiled potatoes or rice; she later reported eating hot dogs and having a poor diet. RD reviewed some common high sodium foods to avoid, which patient denied eating. Discouraged intake of processed foods and use of salt shaker. Pt states that "half the salt evaporates when you cook anyway". RD reviewed the sodium content of salt and discussed that salt does not evaporate. Encouraged intake of fresh fruits and vegetables as well as whole grain sources of carbohydrates to maximize fiber intake.   Teach back method used. Expect fair compliance. Pt states that she needs to eat in moderation with hot dogs and she feels she can add less salt to her foods and eat more fruits and vegetables.   Body mass index is 36.01 kg/m. Pt meets criteria for Obesity based on current BMI.  Current diet order is Heart healthy, patient is consuming approximately 100% of meals at this time. Labs and medications reviewed. No further nutrition interventions warranted at this time. If additional nutrition issues arise, please re-consult RD.   Shari Obrien RD, LDN Inpatient Clinical Dietitian Pager: [phone] After Hours Pager: [phone]

## 2016-07-01 NOTE — Progress Notes (Signed)
PT Cancellation Note  Patient Details Name: Shari Obrien MRN: [id number] DOB: [birth year]   Cancelled Treatment:    Reason Eval/Treat Not Completed: Pain limiting ability to participate.  Pt c/o Rt foot pain that worsens when WB'ing and refuses to attempt EOB or OOB.  Pt educated on benefits of mobility and potential consequences of delaying mobility.  Pt requesting pain medicine but when RN offers Tylenol pt declines saying, "that's not enough, I don't want it".  PT will continue to follow.  Encarnacion ChuAshley Burnham Trost PT, DPT  Pager: [phone] Phone: [phone] 07/01/2016, 10:09 AM

## 2016-07-01 NOTE — Progress Notes (Signed)
   Subjective: This morning, she complains of right foot pain with exquisite tenderness over the first MTP joint. She is also concerned about the swelling that she has noticed over both feet. Her left foot is no longer painful. She also does not complain of trouble breathing or productive cough. She is wondering when she can resume work.  We explained to her today that we would like for her to regain her ability to tolerate ambulation on her right foot. We explained that our workup thus far has ruled out cardiac, hepatic, renal, pulmonary causes for her current constellation of symptoms. As such, we explained to her we would like to empirically treat her for an acute gout flare. She denies any personal or family history of arthritides, autoimmune diseases.  Objective:  Vital signs in last 24 hours: Vitals:   06/30/16 1200 06/30/16 2024 06/30/16 2145 07/01/16 0601  BP: (!) 140/57  136/63 (!) 150/74  Pulse: 80  70 70  Resp: 20  18 17   Temp: 98.8 F (37.1 C)  98.7 F (37.1 C) 98.8 F (37.1 C)  TempSrc: Oral  Oral Oral  SpO2: 91% 93% 93% 96%  Weight:    216 lb 6.4 oz (98.2 kg)  Height:       Physical Exam  Constitutional: She is well-developed, well-nourished, and in no distress.  HENT:  Head: Normocephalic and atraumatic.  Eyes: Conjunctivae are normal. No scleral icterus.  Cardiovascular: Normal rate and regular rhythm.   Pulmonary/Chest: Effort normal. No respiratory distress.  Musculoskeletal:  Right foot with exquisite tenderness over the first MTP joint and pain with dorsiflexion and plantar flexion. Left foot intact range of motion and no pain with dorsiflexion and plantar flexion. Swelling noticed over the dorsum of feet bilaterally. Dorsalis pedis 2+ bilaterally.    Assessment/Plan:  Principal Problem:   Acute respiratory failure with hypoxia (HCC) Active Problems:   Essential hypertension   Pain in joint, ankle and foot   Aortic atherosclerosis (HCC)  Shari Obrien is a  60 year old female with hypertension, previously diagnosed congestive heart failure, COPD hospitalized for atypical pneumonia with persistent feet pain.  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 It is a little bit unusual that the pain has migrated from the left foot the right foot though it is an uncommon presentation of this disorder. Though she recently had a transient kidney injury in the setting of receiving contrast, we feel the risks of steroid therapy outweigh the risks of NSAID therapy. -Start naproxen 500 mg twice daily for empiric treatment of gout -Continue working with physical therapy to mobilize as much as possible to avoid deconditioning  Atypical pneumonia: Continue azithromycin 250 mg [Day 2/5].  Diastolic congestive heart failure: Echo on admission notable for preserved ejection fraction 65-70% with grade 2 diastolic dysfunction some elevation of pulmonary artery pressure 34 mmHg. -Recommend she avoid home furosemide as diuretics or not the mainstay of treatment for diastolic congestive heart failure  COPD: No PFTs on file to review. -Continue Dulera 2 puffs twice daily and albuterol nebulizers every 6 hours as needed for wheezing.  Hypertension: Blood pressures overnight have trended 130s to 150/57-74. -Restart amlodipine 10 mg and will hold lisinopril in the setting of recent kidney injury and   Dispo: Anticipated discharge in approximately 1-2 day(s).   Beather Arbourushil V Patel, MD 07/01/2016, 6:43 AM Pager: @MYPAGER @

## 2016-07-01 NOTE — Progress Notes (Signed)
Orders received to discontinue cardiac monitoring on 07/01/16 @1032 .  CCMD notified.

## 2016-07-02 ENCOUNTER — Inpatient Hospital Stay (HOSPITAL_COMMUNITY): Payer: Self-pay

## 2016-07-02 DIAGNOSIS — M10071 Idiopathic gout, right ankle and foot: Secondary | ICD-10-CM

## 2016-07-02 LAB — BASIC METABOLIC PANEL
Anion gap: 9 (ref 5–15)
BUN: 15 mg/dL (ref 6–20)
CHLORIDE: 101 mmol/L (ref 101–111)
CO2: 30 mmol/L (ref 22–32)
CREATININE: 0.66 mg/dL (ref 0.44–1.00)
Calcium: 8.8 mg/dL — ABNORMAL LOW (ref 8.9–10.3)
GFR calc Af Amer: >60 mL/min (ref >60)
GFR calc non Af Amer: >60 mL/min (ref >60)
Glucose, Bld: 97 mg/dL (ref 65–99)
Potassium: 4.4 mmol/L (ref 3.5–5.1)
SODIUM: 140 mmol/L (ref 135–145)

## 2016-07-02 MED ORDER — GADOBENATE DIMEGLUMINE 529 MG/ML IV SOLN
20.0000 mL | Freq: Once | INTRAVENOUS | Status: AC | PRN
  Administered 2016-07-02: 20 mL via INTRAVENOUS

## 2016-07-02 MED ORDER — NAPROXEN 250 MG PO TABS
500.0000 mg | ORAL_TABLET | Freq: Two times a day (BID) | ORAL | Status: DC
Start: 2016-07-02 — End: 2016-07-03
  Administered 2016-07-02 – 2016-07-03 (×3): 500 mg via ORAL
  Filled 2016-07-02 (×4): qty 2

## 2016-07-02 MED ORDER — LISINOPRIL 40 MG PO TABS
40.0000 mg | ORAL_TABLET | Freq: Every day | ORAL | Status: DC
  Administered 2016-07-02 – 2016-07-03 (×2): 40 mg via ORAL
  Filled 2016-07-02 (×2): qty 1

## 2016-07-02 MED ORDER — CARVEDILOL 25 MG PO TABS
25.0000 mg | ORAL_TABLET | Freq: Two times a day (BID) | ORAL | Status: DC
  Administered 2016-07-02 – 2016-07-03 (×4): 25 mg via ORAL
  Filled 2016-07-02 (×4): qty 1

## 2016-07-02 NOTE — Progress Notes (Addendum)
Physical Therapy Evaluation Patient Details Name: Shari Obrien MRN: [id number] DOB: [birth year] Today's Date: 07/02/2016   History of Present Illness  Pt is a 60 y/o female presenting with a sudden worsening of L foot pain with edema and pain in bilateral feet. PMH including but not limited to COPD, CHF and HTN.  Clinical Impression  Pt admitted with above diagnosis. Pt currently with functional limitations due to the deficits listed below (see PT Problem List). Pt ambulated 200' with RW, SaO2 90% on RA. 6/10 pain R foot with weightbearing.  Goals updated.  Pt will benefit from skilled PT to increase their independence and safety with mobility to allow discharge to the venue listed below.       Follow Up Recommendations No PT follow up    Equipment Recommendations  Rolling walker with 5" wheels    Recommendations for Other Services       Precautions / Restrictions Precautions Precautions: Fall Restrictions Weight Bearing Restrictions: No      Mobility  Bed Mobility Overal bed mobility: Modified Independent Bed Mobility: Supine to Sit;Sit to Supine     Supine to sit: Modified independent (Device/Increase time)     General bed mobility comments: with rail  Transfers Overall transfer level: Modified independent Equipment used: Rolling walker (2 wheeled) Transfers: Sit to/from Stand Sit to Stand: Modified independent (Device/Increase time)         General transfer comment: with RW, good hand positioning, steady  Ambulation/Gait Ambulation/Gait assistance: Modified independent (Device/Increase time) Ambulation Distance (Feet): 200 Feet Assistive device: Rolling walker (2 wheeled)   Gait velocity: decreased Gait velocity interpretation: Below normal speed for age/gender General Gait Details: steady with RW, weightbears laterally and towards heel with R foot, 6/10 pain, SaO2 90% RA walking, no dyspnea  Stairs            Wheelchair Mobility    Modified  Rankin (Stroke Patients Only)       Balance     Sitting balance-Leahy Scale: Good       Standing balance-Leahy Scale: Fair                               Pertinent Vitals/Pain Pain Assessment: 0-10 Faces Pain Scale: Hurts even more Pain Location: R foot medially and dorsal aspect with weight bearing, 0/10 at rest Pain Descriptors / Indicators: Sore Pain Intervention(s): Monitored during session    Home Living                        Prior Function                 Hand Dominance        Extremity/Trunk Assessment                         Communication      Cognition Arousal/Alertness: Awake/alert Behavior During Therapy: WFL for tasks assessed/performed Overall Cognitive Status: No family/caregiver present to determine baseline cognitive functioning                      General Comments      Exercises        Assessment/Plan    PT Assessment    PT Diagnosis     PT Problem List    PT Treatment Interventions     PT Goals (Current goals can be found in the  Care Plan section) Acute Rehab PT Goals Patient Stated Goal: return home PT Goal Formulation: With patient Time For Goal Achievement: 07/07/16 Potential to Achieve Goals: Good    Frequency Min 3X/week   Barriers to discharge        Co-evaluation               End of Session Equipment Utilized During Treatment: Gait belt Activity Tolerance: Patient limited by pain Patient left: with call bell/phone within reach;in chair Nurse Communication: Mobility status         Time: 0981-1914 PT Time Calculation (min) (ACUTE ONLY): 17 min   Charges:     PT Treatments $Gait Training: 8-22 mins   PT G Codes:        Tamala Ser 07/02/2016, 10:52 AM [phone]

## 2016-07-02 NOTE — Progress Notes (Signed)
   Subjective: She continues to report persistent right foot pain which he localizes along the medial surface of her foot. She acknowledged that her range of motion is improved as is the exquisite tenderness overlying the first MTP joint. She is not sure what made the same resolution happen on her left foot and would still like to know why she is having difficulty with ambulation.  Objective:  Vital signs in last 24 hours: Vitals:   07/01/16 2031 07/01/16 2100 07/01/16 2343 07/02/16 0554  BP:  (!) 174/82 (!) 142/69 (!) 169/78  Pulse:  77 73 79  Resp:  18 17 18   Temp:  98.5 F (36.9 C)  98.2 F (36.8 C)  TempSrc:  Oral  Oral  SpO2: 93% 91%  90%  Weight:    220 lb (99.8 kg)  Height:       Physical Exam  Constitutional: She is well-developed, well-nourished, and in no distress.  HENT:  Head: Normocephalic and atraumatic.  Eyes: Conjunctivae are normal. No scleral icterus.  Cardiovascular: Normal rate and regular rhythm.   Pulmonary/Chest: Effort normal. No respiratory distress.  Musculoskeletal:  Right foot with no tenderness overlying first MTP joint with improved plantarflexion/dorsiflexion. Tenderness noted alongside the medial aspect of her right foot.    Assessment/Plan:  Principal Problem:   Atypical pneumonia Active Problems:   Essential hypertension   Pain in joint, ankle and foot   Aortic atherosclerosis (HCC)  Shari Obrien is a 60 year old female with hypertension, previously diagnosed congestive heart failure, COPD hospitalized for atypical pneumonia and persistent right foot pain.  Right foot pain: Initially thinking gout though unclear given the persistence of her symptoms. -Consult IR for joint aspiration and evaluation -Continue naproxen 500 mg twice daily -Continue working with physical therapy to mobilize as much as possible to avoid deconditioning  Atypical pneumonia: Continue azithromycin 250 mg [Day 3/5].  Diastolic congestive heart failure: Echo on  admission notable for preserved ejection fraction 65-70% with grade 2 diastolic dysfunction some elevation of pulmonary artery pressure 34 mmHg. -Discontinue furosemide at discharge as diuretics or not the mainstay of treatment for diastolic congestive heart failure  COPD: No PFTs on file to review. -Continue Dulera 2 puffs twice daily and albuterol nebulizers every 6 hours as needed for wheezing.  Hypertension: Blood pressures have trended 140s to 170s/69-84. Suspect some elevation is from NSAID therapy. -Continue amlodipine 10 mg -Resume lisinopril 40mg  with Crt at baseline this morning  Dispo: Anticipated discharge in approximately 1-2 day(s).   Shari Arbourushil V Junah Yam, MD 07/02/2016, 6:34 AM Pager: @MYPAGER @

## 2016-07-02 NOTE — Progress Notes (Signed)
Subjective: Shari Obrien states pain is resolved in left foot but remains in right foot. Now localized to dorsum and medial aspect of the foot. She states swelling in feet is not improved. She also notes food tastes "saltier" than usual, which could be a side effect of her antibiotic.    Objective: Vital signs in last 24 hours: Vitals:   07/02/16 0554 07/02/16 0854 07/02/16 1005 07/02/16 1135  BP: (!) 169/78   (!) 145/80  Pulse: 79   69  Resp: 18   18  Temp: 98.2 F (36.8 C)   98.4 F (36.9 C)  TempSrc: Oral   Oral  SpO2: 90% 90% 90% 93%  Weight: 99.8 kg (220 lb)     Height:       Weight change: 1.633 kg (3 lb 9.6 oz)  Intake/Output Summary (Last 24 hours) at 07/02/16 1339 Last data filed at 07/02/16 1319  Gross per 24 hour  Intake              940 ml  Output             1900 ml  Net             -960 ml   Physical Exam: General appearance: awake and alert, moderately obese African American women in NAD. Lungs: breathing comfortably on room air. Extremities: Warm and dry. 1+ pitting edema in feet bilaterally. Dorsum and medial aspect of right foot tender to touch. Dorsiflexion/extension improved in right foot compared to yesterday. No pain with movement of toes. Pulses 2+ bilat.  Lab Results: Component     Latest Ref Rng & Units 07/02/2016  Sodium     135 - 145 mmol/L 140  Potassium     3.5 - 5.1 mmol/L 4.4  Chloride     101 - 111 mmol/L 101  CO2     22 - 32 mmol/L 30  Glucose     65 - 99 mg/dL 97  BUN     6 - 20 mg/dL 15  Creatinine     0.44 - 1.00 mg/dL 0.66  Calcium     8.9 - 10.3 mg/dL 8.8 (L)  EGFR (Non-African Amer.)     >60 mL/min >60  EGFR (African American)     >60 mL/min >60  Anion gap     5 - 15 9   Studies/Results: No results found.   Medications: Scheduled Meds: . amLODipine  10 mg Oral Daily  . azithromycin  250 mg Oral Daily  . carvedilol  25 mg Oral BID WC  . enoxaparin (LOVENOX) injection  40 mg Subcutaneous Q24H  . lisinopril  40 mg  Oral Daily  . mometasone-formoterol  2 puff Inhalation BID  . naproxen  500 mg Oral BID WC   Continuous Infusions:  PRN Meds:.acetaminophen, albuterol   Assessment/Plan: Shari Obrien is a 60 yo female with a PMHx significant for diastolic heart dysfunction, COPD and HTN who presents with hypoxia (now improved at rest, remaining with exertion), bilateral LE swelling, and migratory foot pain.  Hypoxia: Improving. On review of CTA with radiologist 8/9, findings were concerning for an atypical pneumonia. She was started on po azithromycin as she had no risk factors for HCAP. On 8/10, Shari Obrien dropped her oxygen sat level to 84% with ambulation despite saturations in the 90's at rest. This is a slight improvement from 8/9 when she required constant supplemental oxygen. Sputum production has improved and is less "green" today.  Echocardiogram 8/9 revealed grade  II diastolic dysfunction, but otherwise unremarkable with normal EF, making HF less likely. Patient has a diagnosis of COPD but no wheeze on exam or typical findings for COPD on imaging. --po azithromycin (day 3/5). 500 mg 8/9, 150 mg for 4 days thereafter --dulera 2 puffs bid --albuterol 2.5 mg q6h prn                                             --avoid home lasix: previously prescribed for hx of CHF with LE swelling, but lasix is not indicated for diastolic dysfunction.                                              --recommend outpatient PFTs to confirm COPD  Foot pain and swelling:Migratory foot pain. Now R>L with swelling bilaterally. Pain radiates up the back of her legs and into the groin area. She denies any numbness or tingling. She has a history of herniated L5 disc repair but pain is asymmetric with no other neurologic symptoms. No family or personal history of rheumatologic/autoimmune conditions. She  denies rash or painful swelling in her joints (other than recent traumatic swelling in one finger). Xray left foot shows no fracture or dislocation. Exam and CBC not concerning for cellulitis. Exam not concerning for arterial embolus or claudication. Venous dopplers show no DVT. Considered polycythemia vera in setting of elevated hemoglobin, but EPO normal. Patient did start new job a few days ago that requires her to be on her feet for long periods of time. This could be contributing to pain but she states she is used to prolonged standing with her other jobs. Gout is a possibility despite non-classic presentation. Osteoarthritis is another potential diagnosis. After 2 doses of naprosyn, pain resolved in left foot but remains in right foot, localized to dorsum and medial aspect. Discussed with IR--they recommend MR of the foot before pursuing further management.            --MR R foot --naproxen 500 mg BID for 5 days --PT consulted: recommending home PT, walker with ambulation  Polycythemia: Hgb 19.9 on admission. Carboxyhemoglobin elevated to 2 with normal EPO. Likely secondary to smoking. Jak2 mutation pending.  Hypertension: Hypertensive on arrival and received 1 dose iv hydralazine. Blood pressure elevated to systolic 356'Y this morning. We will restart home amlodipine, lisinopril, and carvedilol. Creatinine is 0.66.  FEN/GI: Regular diet  DVT prophylaxis:Lovenox  Code Status: FULL CODE  Disposition: Admitted to inpatient. Anticipate stay of 1-2 days.   This is a Careers information officer Note.  The care of the patient was discussed with Dr. Posey Pronto and the assessment and plan formulated with their assistance.  Please see their attached note for official documentation of the daily encounter.   LOS: 3 days   Pershing Cox, Medical Student 07/02/2016, 1:39 PM

## 2016-07-03 MED ORDER — NAPROXEN 500 MG PO TABS: 500.0000 mg | ORAL_TABLET | Freq: Two times a day (BID) | ORAL | 0 refills | Status: DC

## 2016-07-03 MED ORDER — NAPROXEN 250 MG PO TABS
500.0000 mg | ORAL_TABLET | Freq: Two times a day (BID) | ORAL | Status: DC
  Administered 2016-07-03: 500 mg via ORAL
  Filled 2016-07-03 (×2): qty 2

## 2016-07-03 MED ORDER — AZITHROMYCIN 250 MG PO TABS: 250.0000 mg | ORAL_TABLET | Freq: Every day | ORAL | 0 refills | Status: DC

## 2016-07-03 NOTE — Progress Notes (Signed)
   Subjective: This morning, she reports improved ability to ambulate on her right foot though persistent swelling. She is concerned she'll not be able to wear normal shoes with the swelling. I reviewed with her the results of her right foot MRI which were notable for swelling of the dorsum to suggest clinical correlation given the suggested diagnoses of cellulitis, myositis, fasciitis without osteomyelitis or abscess visualized. She is ready to go home and return to work tomorrow, and I provided her with a work note to that effect.  Objective:  Vital signs in last 24 hours: Vitals:   07/02/16 1135 07/02/16 2121 07/02/16 2136 07/03/16 0512  BP: (!) 145/80  (!) 180/86 (!) 167/77  Pulse: 69  71 67  Resp: 18  18 20   Temp: 98.4 F (36.9 C)  98 F (36.7 C) 98.1 F (36.7 C)  TempSrc: Oral  Oral Oral  SpO2: 93% 92% 90% 96%  Weight:    218 lb 12.8 oz (99.2 kg)  Height:       Physical Exam  Constitutional: She is well-developed, well-nourished, and in no distress.  HENT:  Head: Normocephalic and atraumatic.  Eyes: Conjunctivae are normal. No scleral icterus.  Cardiovascular: Normal rate and regular rhythm.   Pulmonary/Chest: Effort normal. No respiratory distress.  Musculoskeletal:  Right foot with no tenderness overlying first MTP joint with improved plantarflexion/dorsiflexion. Swelling still noted of the dorsum, right greater than left.    Assessment/Plan:  Principal Problem:   Atypical pneumonia Active Problems:   Essential hypertension   Pain in joint, ankle and foot   Aortic atherosclerosis (HCC)  Shari Obrien is a 60 year old female with hypertension, previously diagnosed congestive heart failure, COPD hospitalized for atypical pneumonia and persistent right foot swelling.  Right foot swelling: At this point, I suspect her persistent complaint is multifactorial. She likely has chronic venous insufficiency further worsened by amlodipine and potentially an acute gout flare. Right  foot MRI 06/22/16 is reassuring for no other sources, and the infectious etiologies suggested do not appear consistent with her overall well appearance, absence of fevers, lack of warmth or erythema over the affected foot. -Continue naproxen 500 mg twice daily 3 days -Suggest compression stockings for the future if swelling is no better  Atypical pneumonia: Continue azithromycin 250 mg [Day 4/5]. Prescribed one additional tablet for tomorrow.  Diastolic congestive heart failure: Echo on admission notable for preserved ejection fraction 65-70% with grade 2 diastolic dysfunction some elevation of pulmonary artery pressure 34 mmHg. -Discontinued furosemide at discharge as diuretics or not the mainstay of treatment for diastolic congestive heart failure  COPD: No PFTs on file to review. -Continue Dulera 2 puffs twice daily and albuterol nebulizers every 6 hours as needed for wheezing.  Hypertension: Blood pressures have trended 145 to 180/80s. Suspect some elevation is from NSAID therapy. -Continue amlodipine 10 mg, lisinopril 40 mg, carvedilol 25 mg twice daily -Recommend she discuss discontinuing amlodipine   Dispo: Anticipated discharge today.   Shari Arbourushil V Ahmed Inniss, MD 07/03/2016, 5:08 PM Pager: @[email or web address] @

## 2016-07-03 NOTE — Discharge Summary (Signed)
Name: Shari Obrien MRN: [id number] DOB: 09-30-1956 60 y.o. PCP: Ambrose Finland, NP  Date of Admission: 06/29/2016  1:21 AM Date of Discharge: 07/03/2016 Attending Physician: Burns Spain, MD  Discharge Diagnosis: Principal Problem:   Atypical pneumonia Active Problems:   Essential hypertension   Pain in joint, ankle and foot   Aortic atherosclerosis (HCC)   Diastolic heart failure (HCC)   Discharge Medications:   Medication List    STOP taking these medications   BAYER BACK & BODY PAIN EX ST 500-32.5 MG Tabs Generic drug:  Aspirin-Caffeine   CORICIDIN HBP CONGESTION/COUGH PO   furosemide 20 MG tablet Commonly known as:  LASIX   guaiFENesin 200 MG tablet     TAKE these medications   albuterol 108 (90 Base) MCG/ACT inhaler Commonly known as:  PROVENTIL HFA;VENTOLIN HFA Inhale 2 puffs into the lungs every 6 (six) hours as needed for wheezing or shortness of breath.   amLODipine 10 MG tablet Commonly known as:  NORVASC Take 1 tablet (10 mg total) by mouth daily.   azithromycin 250 MG tablet Commonly known as:  ZITHROMAX Take 1 tablet (250 mg total) by mouth daily.   carvedilol 25 MG tablet Commonly known as:  COREG Take 1 tablet (25 mg total) by mouth 2 (two) times daily with a meal.   cetirizine 10 MG tablet Commonly known as:  ZYRTEC Take 10 mg by mouth daily as needed for allergies.   diclofenac sodium 1 % Gel Commonly known as:  VOLTAREN Apply 2 g topically 4 (four) times daily.   Fluticasone-Salmeterol 100-50 MCG/DOSE Aepb Commonly known as:  ADVAIR Inhale 1 puff into the lungs 2 (two) times daily.   hydrocortisone cream 0.5 % Apply 1 application topically 2 (two) times daily. What changed:  when to take this  reasons to take this   lisinopril 40 MG tablet Commonly known as:  PRINIVIL,ZESTRIL Take 1 tablet (40 mg total) by mouth daily. Needs office visit for refills   naproxen 500 MG tablet Commonly known as:  NAPROSYN Take 1  tablet (500 mg total) by mouth 2 (two) times daily with a meal.   triamcinolone 0.025 % ointment Commonly known as:  KENALOG Apply 1 application topically 2 (two) times daily. What changed:  when to take this  reasons to take this       Disposition and follow-up:   Ms.Shari Obrien was discharged from [facility] in Stable condition.  At the hospital follow up visit please address:  Foot swelling: need for compression stockings  Atypical pneumonia: resolution of dyspnea, completion of azithromycin  Polycythemia: follow-up JAK2 study   Follow-up Appointments: Follow-up Information    [facility] Follow up on 07/05/2016.   Why:  At 3:30 pm for hospital follow up... Contact information: [address] [place] [address] [phone]          Hospital Course by problem list: Principal Problem:   Atypical pneumonia Active Problems:   Essential hypertension   Pain in joint, ankle and foot   Aortic atherosclerosis (HCC)   Diastolic heart failure (HCC)   Atypical pneumonia: In the Emergency Department, she was requiring 2L O2 which was new for her. Venous dopplers were performed for asymmetric LE swelling and were reassuring for no DVT. D-dimer was elevated at 1.64, and CTA revealed no pulmonary embolus but did show pronounced bilateral lower lobe opacities and bronchial thickening concerning for a multifocal pneumonia [see below].  She was started on a 5-day course of azithromycin [8/10-8/14], and she was not requiring oxygen at rest prior to discharge. At follow-up, please assess her dyspnea.  Bilateral feet pain/swelling: Unclear etiology. On admission, she complained of left foot pain/swelling which migrated to the right foot. XR imaging of the left foot was reassuring for no fractures, and MR imaging of the right foot was notable for dorsal swelling. She was empirically treated for gout with naproxen  500mg  twice daily and felt some resolution in her pain. At follow-up, please assess her swelling and whether she needs to have compression stockings.  Polycythemia: Hb trended 18-19 since admission. Possibly secondary to ongoing tobacco use as EPO was reassuring. Please follow-up JAK2 results.  Diastolic heart failure: Echocardiogram was notable for grade II diastolic dysfunction but preserved ejection fraction [see below]. Furosemide was not continued during her inpatient stay or at discharge as it is not the mainstay of treatment for diastolic CHF.  Discharge Vitals:   BP (!) 167/77 (BP Location: Right Arm)   Pulse 67   Temp 98.1 F (36.7 C) (Oral)   Resp 20   Ht 5\' 5"  (1.651 m)   Wt 218 lb 12.8 oz (99.2 kg)   SpO2 96%   BMI 36.41 kg/m   Pertinent Labs, Studies, and Procedures:  LEFT FOOT - COMPLETE 3+ VIEW 06/29/16  COMPARISON:  None.  FINDINGS: There is no evidence of fracture or dislocation. The joint spaces are preserved. There is no evidence of talar subluxation; the subtalar joint is unremarkable in appearance. Plantar and posterior calcaneal spurs are noted. Widening of the calcaneocuboid distance may reflect prior resection.  Diffuse soft tissue swelling is noted about the foot and ankle.  IMPRESSION: No evidence of fracture or dislocation. Widening of the calcaneocuboid distance may reflect prior resection; would correlate clinically.    CT ANGIOGRAPHY CHEST WITH CONTRAST 06/29/16  TECHNIQUE: Multidetector CT imaging of the chest was performed using the standard protocol during bolus administration of intravenous contrast. Multiplanar CT image reconstructions and MIPs were obtained to evaluate the vascular anatomy.  CONTRAST:  100 cc Isovue 370 IV  COMPARISON:  Radiographs earlier this day  FINDINGS: Cardiovascular: There are no filling defects within the pulmonary arteries to suggest pulmonary embolus. The thoracic aorta is normal in caliber  without evidence of dissection. Mild atherosclerosis of the aortic arch. There is a conventional branching pattern from the aortic arch. Mild contrast refluxing into the hepatic veins and IVC. Mild right heart prominence.  Mediastinum/Nodes: Prominent AP window lymph node measures 11 mm. Prominent bilateral hilar lymph nodes. No pericardial effusion. No mediastinal mass. The esophagus is decompressed.  Lungs/Pleura: Bilateral dependent lower lobe opacities, right greater than left. Linear opacities in the lingula and right middle lobe. Minimal peripheral opacity in the dependent left upper lobe. There is bronchial thickening. Debris/ mucus in the right mainstem bronchus. No definite septal thickening.  Upper Abdomen: No acute abnormality.  Musculoskeletal: There are no acute or suspicious osseous abnormalities. Degenerative change in the spine.  Review of the MIP images confirms the above findings.  IMPRESSION: 1. No pulmonary embolus. 2. Bilateral dependent lower lobe opacities with linear opacities in the lingula and right middle lobe. There is debris/mucus in the right mainstem bronchus. Findings can be seen in the setting of aspiration. Multifocal pneumonia is an alternate consideration. 3. Contrast refluxing into the hepatic veins and IVC, suggesting right heart failure. 4. Prominent hilar and mediastinal nodes are likely reactive.    Echo 06/30/16  -  Left ventricle: The cavity size was normal. Systolic function was   vigorous. The estimated ejection fraction was in the range of 65%   to 70%. There was dynamic obstruction at rest, with a peak   velocity of 136 cm/sec and a peak gradient of 7 mm Hg. Wall   motion was normal; there were no regional wall motion   abnormalities. Features are consistent with a pseudonormal left   ventricular filling pattern, with concomitant abnormal relaxation   and increased filling pressure (grade 2 diastolic dysfunction). - Aortic  valve: Transvalvular velocity was within the normal range.   There was no stenosis. There was no regurgitation. - Mitral valve: Transvalvular velocity was within the normal range.   There was no evidence for stenosis. There was no regurgitation. - Right ventricle: The cavity size was normal. Wall thickness was   normal. Systolic function was normal. - Atrial septum: No defect or patent foramen ovale was identified   by color flow Doppler. - Tricuspid valve: There was trivial regurgitation. - Pulmonary arteries: Systolic pressure was within the normal   range. PA peak pressure: 34 mm Hg (S).    MRI OF THE RIGHT FOREFOOT WITHOUT AND WITH CONTRAST 07/02/16  TECHNIQUE: Multiplanar, multisequence MR imaging was performed both before and after administration of intravenous contrast.  CONTRAST:  20mL MULTIHANCE GADOBENATE DIMEGLUMINE 529 MG/ML IV SOLN  COMPARISON:  None.  FINDINGS: No significant abnormal osseous edema or enhancement to suggest osteomyelitis. There is some minimal spurring at the articulation of the first metatarsal head and the lateral sesamoid.  Diffuse subcutaneous edema in the forefoot most concentrated along the dorsum of the forefoot. There is some low-grade associated enhancement dorsally, raising suspicion for cellulitis.  No abscess or foreign body identified. Lisfranc joint alignment normal and the Lisfranc ligament appears intact.  No intermetatarsal mass to suggest Morton's neuroma.  There is mild distal flexor hallucis longus tenosynovitis just proximal to the knot of Henry. Edema tracks within along the plantar musculature of the foot.  IMPRESSION: 1. Diffuse subcutaneous edema in the right foot, especially dorsally, raising suspicion for cellulitis given the associated enhancement. Some of this edema tracks within along the plantar musculature of the foot, raising the possibility of myositis/low-grade fasciitis. There is no drainable  abscess or osteomyelitis identified. 2. Mild flexor hallucis longus tenosynovitis.    Discharge Instructions: Discharge Instructions    Call MD for:  difficulty breathing, headache or visual disturbances    Complete by:  As directed   Call MD for:  redness, tenderness, or signs of infection (pain, swelling, redness, odor or green/yellow discharge around incision site)    Complete by:  As directed   Call MD for:  temperature >100.4    Complete by:  As directed   Increase activity slowly    Complete by:  As directed      Signed: Beather Arbour, MD 07/03/2016, 12:56 PM   Pager: @MYPAGER @

## 2016-07-03 NOTE — Discharge Instructions (Signed)
Please take Aleve [naproxen] twice daily for the foot pain/swelling through Monday and do NOT take Bayer Back & Body Pain while you are taking this medication.  Please take the last antibiotic tomorrow.  STOP taking Lasix as you do not have heart failure.  Talk to your regular doctor about possibly coming off amlodipine if the pain is still a concern.

## 2016-07-05 ENCOUNTER — Ambulatory Visit: Payer: Self-pay | Attending: Internal Medicine | Admitting: Physician Assistant

## 2016-07-05 ENCOUNTER — Encounter: Payer: Self-pay | Admitting: Physician Assistant

## 2016-07-05 VITALS — BP 169/92 | HR 78 | Temp 98.6°F | Wt 216.4 lb

## 2016-07-05 DIAGNOSIS — J449 Chronic obstructive pulmonary disease, unspecified: Secondary | ICD-10-CM

## 2016-07-05 DIAGNOSIS — M545 Low back pain, unspecified: Secondary | ICD-10-CM

## 2016-07-05 DIAGNOSIS — I10 Essential (primary) hypertension: Secondary | ICD-10-CM

## 2016-07-05 DIAGNOSIS — J189 Pneumonia, unspecified organism: Secondary | ICD-10-CM

## 2016-07-05 DIAGNOSIS — R609 Edema, unspecified: Secondary | ICD-10-CM

## 2016-07-05 DIAGNOSIS — I5030 Unspecified diastolic (congestive) heart failure: Secondary | ICD-10-CM | POA: Diagnosis present

## 2016-07-05 MED ORDER — AMLODIPINE BESYLATE 10 MG PO TABS: 10.0000 mg | ORAL_TABLET | Freq: Every day | ORAL | 1 refills | Status: DC

## 2016-07-05 MED ORDER — NAPROXEN 500 MG PO TABS: 500.0000 mg | ORAL_TABLET | Freq: Two times a day (BID) | ORAL | 0 refills | Status: DC

## 2016-07-05 MED ORDER — CARVEDILOL 25 MG PO TABS: 25.0000 mg | ORAL_TABLET | Freq: Two times a day (BID) | ORAL | 1 refills | Status: DC

## 2016-07-05 MED ORDER — LISINOPRIL 40 MG PO TABS: 40.0000 mg | ORAL_TABLET | Freq: Every day | ORAL | 1 refills | Status: DC

## 2016-07-05 MED ORDER — FLUTICASONE-SALMETEROL 100-50 MCG/DOSE IN AEPB: 1.0000 | INHALATION_SPRAY | Freq: Two times a day (BID) | RESPIRATORY_TRACT | 3 refills | Status: DC

## 2016-07-05 MED ORDER — ALBUTEROL SULFATE HFA 108 (90 BASE) MCG/ACT IN AERS: 2.0000 | INHALATION_SPRAY | Freq: Four times a day (QID) | RESPIRATORY_TRACT | 3 refills | Status: DC | PRN

## 2016-07-05 MED FILL — AMLODIPINE BESYLATE 10 MG T: 10 | 30 days supply | Qty: 30 | Fill #0

## 2016-07-05 MED FILL — VENTOLIN HFA 90 MCG INHALER: 108 (90 BAS | 20 days supply | Qty: 18 | Fill #0

## 2016-07-05 MED FILL — ?CARVEDILOL 25 MG TABLET: 25 | 30 days supply | Qty: 60 | Fill #0

## 2016-07-05 MED FILL — **ADVAIR 100/50 DISKUS: 100-50 MCG | 9 days supply | Qty: 28 | Fill #0

## 2016-07-05 MED FILL — ?LISINOPRIL 40 MG TABLET: 40 MG | 30 days supply | Qty: 30 | Fill #0

## 2016-07-05 MED FILL — NAPROXEN 500 MG TABLET: 500 | 30 days supply | Qty: 60 | Fill #0

## 2016-07-05 NOTE — Progress Notes (Signed)
Patient ID: Shari Obrien, female   DOB: 05-02-1956, 60 y.o.   MRN: [id number]   Shari Obrien, is a 60 y.o. female  [id number]  [id number]  DOB - 1956/05/16  Subjective:  Chief Complaint and HPI: Shari Obrien is a 60 y.o. female here today for a follow up visit after being hospitalized for: Principal Problem:   Atypical pneumonia Active Problems:   Essential hypertension   Pain in joint, ankle and foot   Aortic atherosclerosis (HCC)   Diastolic heart failure (HCC) .  She has completed the antibiotics and is feeling much better.  No SOB now;  Breathing much improved.  She admits for pooer compliance overall with medications and f/up appointments due primarily to financial barriers.  Today she needs RF on BP meds.  She has not taken these the last few days. Swelling in her legs has improved but still present.   Hospital course: CT negative for PE; lower extremity doppler neg In the Emergency Department, she was requiring 2L O2 which was new for her. Venous dopplers were performed for asymmetric LE swelling and were reassuring for no DVT. D-dimer was elevated at 1.64, and CTA revealed no pulmonary embolus but did show pronounced bilateral lower lobe opacities and bronchial thickening concerning for a multifocal pneumonia [see below]. She was started on a 5-day course of azithromycin [8/10-8/14], and she was not requiring oxygen at rest prior to discharge.  Bilateral feet pain/swelling: Unclear etiology. On admission, she complained of left foot pain/swelling which migrated to the right foot. XR imaging of the left foot was reassuring for no fractures, and MR imaging of the right foot was notable for dorsal swelling. She was empirically treated for gout with naproxen 500mg  twice daily and felt some resolution in her pain.  Polycythemia: Hb trended 18-19 since admission. Possibly secondary to ongoing tobacco use as EPO was reassuring. Please follow-up JAK2 results. (studies not back  as of 07/05/2016)  Diastolic heart failure: Echocardiogram was notable for grade II diastolic dysfunction but preserved ejection fraction [see below]. Furosemide was not continued during her inpatient stay or at discharge as it is not the mainstay of treatment for diastolic CHF.  No new complaints today.   ED/Hospital notes/labs/studies reviewed.     ROS:   Constitutional:  No f/c, No night sweats, No unexplained weight loss. EENT:  No vision changes, No blurry vision, No hearing changes. No mouth, throat, or ear problems.  Respiratory: mild cough and improving SOB Cardiac: No CP, no palpitations GI:  No abd pain, No N/V/D. GU: No Urinary s/sx Musculoskeletal: back pain and leg swelling Neuro: No headache, no dizziness, no motor weakness.  Skin: No rash Endocrine:  No polydipsia. No polyuria.  Psych: Denies SI/HI  No problems updated.  ALLERGIES: Allergies  Allergen Reactions  . Periactin [Cyproheptadine] Swelling    PAST MEDICAL HISTORY: Past Medical History:  Diagnosis Date  . Aortic atherosclerosis (HCC)   . CHF (congestive heart failure) (HCC)   . COPD (chronic obstructive pulmonary disease) (HCC) Dx 2013  . HTN (hypertension) 08/30/2013  . Hypertension   . Lower leg edema 06/2016    MEDICATIONS AT HOME: Prior to Admission medications   Medication Sig Start Date End Date Taking? Authorizing Provider  amLODipine (NORVASC) 10 MG tablet Take 1 tablet (10 mg total) by mouth daily. 07/05/16  Yes Anders Simmonds, PA-C  carvedilol (COREG) 25 MG tablet Take 1 tablet (25 mg total) by mouth 2 (two) times daily with a meal. 07/05/16  Yes Anders Simmonds, PA-C  cetirizine (ZYRTEC) 10 MG tablet Take 10 mg by mouth daily as needed for allergies.    Yes Historical Provider, MD  diclofenac sodium (VOLTAREN) 1 % GEL Apply 2 g topically 4 (four) times daily. 09/30/14  Yes Ambrose Finland, NP  Fluticasone-Salmeterol (ADVAIR) 100-50 MCG/DOSE AEPB Inhale 1 puff into the lungs 2 (two)  times daily. 07/05/16  Yes Anders Simmonds, PA-C  hydrocortisone cream 0.5 % Apply 1 application topically 2 (two) times daily. Patient taking differently: Apply 1 application topically 2 (two) times daily as needed for itching.  01/19/16  Yes Ambrose Finland, NP  lisinopril (PRINIVIL,ZESTRIL) 40 MG tablet Take 1 tablet (40 mg total) by mouth daily. 07/05/16  Yes Anders Simmonds, PA-C  naproxen (NAPROSYN) 500 MG tablet Take 1 tablet (500 mg total) by mouth 2 (two) times daily with a meal. 07/05/16  Yes Marzella Schlein Cydni Reddoch, PA-C  triamcinolone (KENALOG) 0.025 % ointment Apply 1 application topically 2 (two) times daily. Patient taking differently: Apply 1 application topically 2 (two) times daily as needed (dermatitis).  01/20/16  Yes Ambrose Finland, NP  albuterol (PROVENTIL HFA;VENTOLIN HFA) 108 (90 Base) MCG/ACT inhaler Inhale 2 puffs into the lungs every 6 (six) hours as needed for wheezing or shortness of breath. 07/05/16   Anders Simmonds, PA-C     Objective:  EXAM:   Vitals:   07/05/16 1518  BP: (!) 169/92  Pulse: 78  Temp: 98.6 F (37 C)  TempSrc: Oral  Weight: 216 lb 6.4 oz (98.2 kg)    General appearance : A&OX3. NAD. Non-toxic-appearing HEENT: Atraumatic and Normocephalic.  PERRLA. EOM intact.  TM clear B. Mouth-MMM, post pharynx WNL w/o erythema, No PND. Neck: supple, no JVD. No cervical lymphadenopathy. No thyromegaly Chest/Lungs:  Breathing-non-labored, Good air entry bilaterally, breath sounds normal without rales, rhonchi, or wheezing  CVS: S1 S2 regular, no murmurs, gallops, rubs Extremities: Bilateral Lower Ext shows 1-2+ edema L>R, both legs are warm to touch with = pulse throughout Neurology:  CN II-XII grossly intact, Non focal.   Psych:  TP linear. J/I WNL. Normal speech. Appropriate eye contact and affect.  Skin:  No Rash  Data Review Lab Results  Component Value Date   HGBA1C 6.4 (H) 01/19/2016   HGBA1C 6.5 (H) 09/23/2014     Assessment & Plan   1. Chronic  obstructive pulmonary disease, unspecified COPD, unspecified chronic bronchitis type - Fluticasone-Salmeterol (ADVAIR) 100-50 MCG/DOSE AEPB; Inhale 1 puff into the lungs 2 (two) times daily.  Dispense: 1 each; Refill: 3 - albuterol (PROVENTIL HFA;VENTOLIN HFA) 108 (90 Base) MCG/ACT inhaler; Inhale 2 puffs into the lungs every 6 (six) hours as needed for wheezing or shortness of breath.  Dispense: 1 Inhaler; Refill: 3  2. Essential hypertension Compliance with meds discussed at legnth.  There are financial barriers.  - amLODipine (NORVASC) 10 MG tablet; Take 1 tablet (10 mg total) by mouth daily.  Dispense: 30 tablet; Refill: 1 - lisinopril (PRINIVIL,ZESTRIL) 40 MG tablet; Take 1 tablet (40 mg total) by mouth daily.  Dispense: 30 tablet; Refill: 1 - carvedilol (COREG) 25 MG tablet; Take 1 tablet (25 mg total) by mouth 2 (two) times daily with a meal.  Dispense: 60 tablet; Refill: 1  3. Atypical pneumonia Much improved.  She has completed the antibiotics; breathing has been better daily.   4. Edema, unspecified type Compression stockings Rx written  5. Midline low back pain without sciatica  - naproxen (NAPROSYN) 500  MG tablet; Take 1 tablet (500 mg total) by mouth 2 (two) times daily with a meal.  Dispense: 60 tablet; Refill: 0  6. Diastolic heart failure, unspecified heart failure chronicity (HCC) Grade 2 diastolic function-smoking cessation and htn control imperative Check BP daily(she has a home meter) and take meds as directed.  Compliance imperative.    7. Polycythemia with normal EPO-JAK2 study pending-check at f/up  Patient have been counseled extensively about nutrition and exercise  Return in about 2 weeks (around 07/19/2016) for assign to new PCP; review BP.  The patient was given clear instructions to go to ER or return to medical center if symptoms don't improve, worsen or new problems develop. The patient verbalized understanding. The patient was told to call to get lab  results if they haven't heard anything in the next week.     Georgian Co, PA-C [facility] and [facility] [place], [place] [phone]   07/05/2016, 5:19 PM

## 2016-07-05 NOTE — Progress Notes (Signed)
Pt states she is here as hospital follow up and medication refill.  Pt states she has not taken her blood pressure medication as prescribed due to financial difficulty and have to take medication as needed.

## 2016-07-05 NOTE — Patient Instructions (Signed)
Check Blood Pressure at home daily and record and bring to next office visit.

## 2016-07-07 LAB — JAK2 GENOTYPR

## 2016-07-30 ENCOUNTER — Other Ambulatory Visit: Payer: Self-pay | Admitting: *Deleted

## 2016-07-30 DIAGNOSIS — J449 Chronic obstructive pulmonary disease, unspecified: Secondary | ICD-10-CM

## 2016-07-30 MED ORDER — ALBUTEROL SULFATE HFA 108 (90 BASE) MCG/ACT IN AERS: 2.0000 | INHALATION_SPRAY | Freq: Four times a day (QID) | RESPIRATORY_TRACT | 3 refills | Status: DC | PRN

## 2016-07-30 MED ORDER — FLUTICASONE-SALMETEROL 100-50 MCG/DOSE IN AEPB: 1.0000 | INHALATION_SPRAY | Freq: Two times a day (BID) | RESPIRATORY_TRACT | 3 refills | Status: DC

## 2016-07-30 NOTE — Telephone Encounter (Signed)
PRINTED FOR PASS PROGRAM 

## 2016-08-02 ENCOUNTER — Ambulatory Visit: Payer: Self-pay | Admitting: Family Medicine

## 2016-08-05 ENCOUNTER — Other Ambulatory Visit: Payer: Self-pay | Admitting: Internal Medicine

## 2016-08-05 MED FILL — AMLODIPINE BESYLATE 10 MG T: 10 | 30 days supply | Qty: 30 | Fill #1

## 2016-08-05 MED FILL — **ADVAIR 100/50 DISKUS: 100-50 MCG | 7 days supply | Qty: 14 | Fill #1

## 2016-08-05 MED FILL — ?LISINOPRIL 40 MG TABLET: 40 MG | 30 days supply | Qty: 30 | Fill #1

## 2016-08-05 MED FILL — TRIAMCINOLONE 0.025% OINT: 0.025 | 15 days supply | Qty: 30 | Fill #0

## 2016-08-05 MED FILL — VENTOLIN HFA 90 MCG INHALER: 108 (90 BAS | 20 days supply | Qty: 18 | Fill #1

## 2016-08-05 MED FILL — CARVEDILOL 25 MG TABLET: 25 | 30 days supply | Qty: 60 | Fill #1

## 2016-08-06 ENCOUNTER — Ambulatory Visit: Payer: Self-pay | Attending: Family Medicine | Admitting: Family Medicine

## 2016-08-06 ENCOUNTER — Encounter: Payer: Self-pay | Admitting: Family Medicine

## 2016-08-06 VITALS — BP 170/75 | HR 73 | Temp 97.7°F | Ht 64.0 in | Wt 225.0 lb

## 2016-08-06 DIAGNOSIS — F17200 Nicotine dependence, unspecified, uncomplicated: Secondary | ICD-10-CM | POA: Insufficient documentation

## 2016-08-06 DIAGNOSIS — I5030 Unspecified diastolic (congestive) heart failure: Secondary | ICD-10-CM

## 2016-08-06 DIAGNOSIS — T25021A Burn of unspecified degree of right foot, initial encounter: Secondary | ICD-10-CM | POA: Insufficient documentation

## 2016-08-06 DIAGNOSIS — T25121A Burn of first degree of right foot, initial encounter: Secondary | ICD-10-CM

## 2016-08-06 DIAGNOSIS — L209 Atopic dermatitis, unspecified: Secondary | ICD-10-CM

## 2016-08-06 DIAGNOSIS — I10 Essential (primary) hypertension: Secondary | ICD-10-CM

## 2016-08-06 DIAGNOSIS — Z720 Tobacco use: Secondary | ICD-10-CM

## 2016-08-06 DIAGNOSIS — F17210 Nicotine dependence, cigarettes, uncomplicated: Secondary | ICD-10-CM | POA: Insufficient documentation

## 2016-08-06 DIAGNOSIS — Z79899 Other long term (current) drug therapy: Secondary | ICD-10-CM | POA: Insufficient documentation

## 2016-08-06 DIAGNOSIS — X088XXA Exposure to other specified smoke, fire and flames, initial encounter: Secondary | ICD-10-CM | POA: Insufficient documentation

## 2016-08-06 MED ORDER — TRIAMCINOLONE ACETONIDE 0.025 % EX OINT: TOPICAL_OINTMENT | CUTANEOUS | 5 refills | Status: DC

## 2016-08-06 MED ORDER — LISINOPRIL 40 MG PO TABS: 40.0000 mg | ORAL_TABLET | Freq: Every day | ORAL | 5 refills | Status: DC

## 2016-08-06 MED ORDER — FUROSEMIDE 40 MG PO TABS: 40.0000 mg | ORAL_TABLET | Freq: Every day | ORAL | 2 refills | Status: DC | PRN

## 2016-08-06 MED ORDER — HYDROCORTISONE 0.5 % EX CREA: 1.0000 "application " | TOPICAL_CREAM | Freq: Two times a day (BID) | CUTANEOUS | 5 refills | Status: DC | PRN

## 2016-08-06 MED ORDER — AMLODIPINE BESYLATE 10 MG PO TABS: 10.0000 mg | ORAL_TABLET | Freq: Every day | ORAL | 5 refills | Status: DC

## 2016-08-06 MED ORDER — BUPROPION HCL ER (SR) 150 MG PO TB12: ORAL_TABLET | ORAL | 2 refills | Status: DC

## 2016-08-06 MED ORDER — CARVEDILOL 25 MG PO TABS: 25.0000 mg | ORAL_TABLET | Freq: Two times a day (BID) | ORAL | 5 refills | Status: DC

## 2016-08-06 NOTE — Assessment & Plan Note (Signed)
A: BP elevated Med: no norvasc for 1 week, lasix 20 mg prn P: Refilled all meds Add lasix 40 mg daily prn swelling

## 2016-08-06 NOTE — Patient Instructions (Addendum)
Shari Obrien was seen today for hypertension and medication refill.  Diagnoses and all orders for this visit:  Diastolic heart failure, unspecified heart failure chronicity (HCC) -     furosemide (LASIX) 40 MG tablet; Take 1 tablet (40 mg total) by mouth daily as needed for edema.  Current smoker -     buPROPion (WELLBUTRIN SR) 150 MG 12 hr tablet; Take my mouth 150 mg daily for 3 days then twice daily  Essential hypertension -     lisinopril (PRINIVIL,ZESTRIL) 40 MG tablet; Take 1 tablet (40 mg total) by mouth daily. -     amLODipine (NORVASC) 10 MG tablet; Take 1 tablet (10 mg total) by mouth daily. -     carvedilol (COREG) 25 MG tablet; Take 1 tablet (25 mg total) by mouth 2 (two) times daily with a meal. -     furosemide (LASIX) 40 MG tablet; Take 1 tablet (40 mg total) by mouth daily as needed for edema.  Atopic dermatitis -     hydrocortisone cream 0.5 %; Apply 1 application topically 2 (two) times daily as needed for itching. -     triamcinolone (KENALOG) 0.025 % ointment; APPLY 1 APPLICATION TOPICALLY 2 TIMES DAILY.  Burn of right foot, first degree, initial encounter  Other orders -     Cancel: furosemide (LASIX) 40 MG tablet; Take 1 tablet (40 mg total) by mouth daily.   F/u in 4 weeks for HTN  Dr. Armen PickupFunches

## 2016-08-06 NOTE — Progress Notes (Signed)
Subjective:  Patient ID: Shari Obrien, female    DOB: 03-Nov-1956  Age: 60 y.o. MRN: [id number]  CC: Hypertension and Medication Refill   HPI Shari Obrien has HTN and grade 2 diastolic dysfunction, she is a smoker she presents for   1. HTN: has chronic HTN and grade 2 diastolic dysfunction. Out of amlodipine for 1 weeks. Takes lasix 20 mg once daily prn. She eats low salt diet. She smokes.  2. Smoking: 3/4 pack daily. Has stress in her life related to income. Amenable  to quitting.   3. Burn on foot: burn to R foot < 1 week ago. Has blister. No swelling or fever. No drainage. She dropped a hot tray on her R foot.  Social History  Substance Use Topics  . Smoking status: Current Every Day Smoker    Packs/day: 1.00    Years: 38.00    Types: Cigarettes  . Smokeless tobacco: Never Used  . Alcohol use No    Outpatient Medications Prior to Visit  Medication Sig Dispense Refill  . albuterol (PROVENTIL HFA;VENTOLIN HFA) 108 (90 Base) MCG/ACT inhaler Inhale 2 puffs into the lungs every 6 (six) hours as needed for wheezing or shortness of breath. 54 Inhaler 3  . amLODipine (NORVASC) 10 MG tablet Take 1 tablet (10 mg total) by mouth daily. 30 tablet 1  . carvedilol (COREG) 25 MG tablet Take 1 tablet (25 mg total) by mouth 2 (two) times daily with a meal. 60 tablet 1  . diclofenac sodium (VOLTAREN) 1 % GEL Apply 2 g topically 4 (four) times daily. 100 g 2  . Fluticasone-Salmeterol (ADVAIR) 100-50 MCG/DOSE AEPB Inhale 1 puff into the lungs 2 (two) times daily. 180 each 3  . hydrocortisone cream 0.5 % Apply 1 application topically 2 (two) times daily. (Patient taking differently: Apply 1 application topically 2 (two) times daily as needed for itching. ) 30 g 0  . lisinopril (PRINIVIL,ZESTRIL) 40 MG tablet Take 1 tablet (40 mg total) by mouth daily. 30 tablet 1  . triamcinolone (KENALOG) 0.025 % ointment APPLY 1 APPLICATION TOPICALLY 2 TIMES DAILY. 30 g 0  . cetirizine (ZYRTEC) 10 MG  tablet Take 10 mg by mouth daily as needed for allergies.     . naproxen (NAPROSYN) 500 MG tablet Take 1 tablet (500 mg total) by mouth 2 (two) times daily with a meal. (Patient not taking: Reported on 08/06/2016) 60 tablet 0   No facility-administered medications prior to visit.     ROS Review of Systems  Constitutional: Negative for chills and fever.  Eyes: Negative for visual disturbance.  Respiratory: Negative for shortness of breath.   Cardiovascular: Positive for leg swelling. Negative for chest pain.  Gastrointestinal: Negative for abdominal pain and blood in stool.  Musculoskeletal: Negative for arthralgias and back pain.  Skin: Positive for rash.  Allergic/Immunologic: Negative for immunocompromised state.  Hematological: Negative for adenopathy. Does not bruise/bleed easily.  Psychiatric/Behavioral: Negative for dysphoric mood and suicidal ideas.    Objective:  BP (!) 170/75 (BP Location: Left Arm, Patient Position: Sitting, Cuff Size: Large)   Pulse 73   Temp 97.7 F (36.5 C) (Oral)   Ht 5\' 4"  (1.626 m)   Wt 225 lb (102.1 kg)   SpO2 (!) 86%   BMI 38.62 kg/m   BP/Weight 08/06/2016 07/05/2016 07/03/2016  Systolic BP 170 169 167  Diastolic BP 75 92 77  Wt. (Lbs) 225 216.4 218.8  BMI 38.62 36.01 -    Physical Exam  Constitutional: She is oriented to person, place, and time. She appears well-developed and well-nourished. No distress.  HENT:  Head: Normocephalic and atraumatic.  Cardiovascular: Normal rate, regular rhythm, normal heart sounds and intact distal pulses.   Pulmonary/Chest: Effort normal and breath sounds normal.  Musculoskeletal: She exhibits edema (1+ b/l Shari edema ).  Neurological: She is alert and oriented to person, place, and time.  Skin: Skin is warm and dry. No rash noted.     Psychiatric: She has a normal mood and affect.     Assessment & Plan:  Shari Obrien was seen today for hypertension and medication refill.  Diagnoses and all orders for  this visit:  Diastolic heart failure, unspecified heart failure chronicity (HCC) -     furosemide (LASIX) 40 MG tablet; Take 1 tablet (40 mg total) by mouth daily as needed for edema.  Current smoker -     buPROPion (WELLBUTRIN SR) 150 MG 12 hr tablet; Take my mouth 150 mg daily for 3 days then twice daily  Essential hypertension -     lisinopril (PRINIVIL,ZESTRIL) 40 MG tablet; Take 1 tablet (40 mg total) by mouth daily. -     amLODipine (NORVASC) 10 MG tablet; Take 1 tablet (10 mg total) by mouth daily. -     carvedilol (COREG) 25 MG tablet; Take 1 tablet (25 mg total) by mouth 2 (two) times daily with a meal. -     furosemide (LASIX) 40 MG tablet; Take 1 tablet (40 mg total) by mouth daily as needed for edema.  Atopic dermatitis -     hydrocortisone cream 0.5 %; Apply 1 application topically 2 (two) times daily as needed for itching. -     triamcinolone (KENALOG) 0.025 % ointment; APPLY 1 APPLICATION TOPICALLY 2 TIMES DAILY.  Burn of right foot, first degree, initial encounter  Other orders -     Cancel: furosemide (LASIX) 40 MG tablet; Take 1 tablet (40 mg total) by mouth daily.   There are no diagnoses linked to this encounter.  No orders of the defined types were placed in this encounter.   Follow-up: Return in about 4 weeks (around 09/03/2016) for HTN .   Dessa Phi MD

## 2016-08-06 NOTE — Assessment & Plan Note (Signed)
Grade 2 diastolic dysfunction in smoker with HTN  Plan: Control fluid with lasix prn Control BP Smoking cessation

## 2016-08-06 NOTE — Assessment & Plan Note (Signed)
Burn on R foot blister No ulcer  Home wound care F/u in 4 weeks

## 2016-08-06 NOTE — Assessment & Plan Note (Signed)
Smoking Start wellbutrin Cessation counseling done

## 2016-08-13 MED FILL — BUPROPION SR 150 MG TABLET: 150 | 33 days supply | Qty: 60 | Fill #0

## 2016-09-14 MED FILL — **ADVAIR 100/50 DISKUS: 100-50 MCG | 7 days supply | Qty: 14 | Fill #2

## 2016-09-14 MED FILL — FUROSEMIDE 40 MG TABLET: 40 | 30 days supply | Qty: 30 | Fill #0

## 2016-09-14 MED FILL — CARVEDILOL 25 MG TABLET: 25 | 30 days supply | Qty: 60 | Fill #0

## 2016-09-14 MED FILL — VENTOLIN HFA 90 MCG INHALER: 108 (90 BAS | 20 days supply | Qty: 18 | Fill #2

## 2016-09-14 MED FILL — LISINOPRIL 40 MG TABLET: 40 | 30 days supply | Qty: 30 | Fill #0

## 2016-09-14 MED FILL — ?AMLODIPINE BESYLATE 10 MG: 10 | 30 days supply | Qty: 30 | Fill #0

## 2016-10-20 MED FILL — AMLODIPINE BESYLATE 10 MG T: 10 | 30 days supply | Qty: 30 | Fill #1

## 2016-10-20 MED FILL — LISINOPRIL 40 MG TABLET: 40 | 30 days supply | Qty: 30 | Fill #1

## 2016-10-20 MED FILL — ADVAIR 100/50 DISKUS: 100-50 | 30 days supply | Qty: 60 | Fill #3

## 2016-10-20 MED FILL — CARVEDILOL 25 MG TABLET: 25 | 30 days supply | Qty: 60 | Fill #1

## 2016-10-20 MED FILL — FUROSEMIDE 40 MG TABLET: 40 | 30 days supply | Qty: 30 | Fill #1

## 2016-12-09 MED FILL — ?AMLODIPINE BESYLATE 10 MG: 10 | 30 days supply | Qty: 30 | Fill #2

## 2016-12-09 MED FILL — $ADVAIR 100/50 MCG INHALER: 100-50 | 30 days supply | Qty: 60 | Fill #4

## 2016-12-09 MED FILL — ?LISINOPRIL 40 MG TABLET: 40 MG | 30 days supply | Qty: 30 | Fill #2

## 2016-12-09 MED FILL — ?FUROSEMIDE 40 MG TABLET: 40 | 30 days supply | Qty: 30 | Fill #2

## 2017-01-12 ENCOUNTER — Other Ambulatory Visit: Payer: Self-pay | Admitting: Family Medicine

## 2017-01-12 DIAGNOSIS — I5030 Unspecified diastolic (congestive) heart failure: Secondary | ICD-10-CM

## 2017-01-12 DIAGNOSIS — I10 Essential (primary) hypertension: Secondary | ICD-10-CM

## 2017-01-12 MED FILL — VENTOLIN HFA 90 MCG INHALER: 108 (90 BAS | 25 days supply | Qty: 18 | Fill #3

## 2017-01-12 MED FILL — ?FUROSEMIDE 40 MG TABLET: 40 | 30 days supply | Qty: 30 | Fill #0

## 2017-01-12 MED FILL — $ADVAIR 100/50 MCG INHALER: 100-50 | 30 days supply | Qty: 60 | Fill #5

## 2017-01-12 MED FILL — ?CARVEDILOL 25 MG TABLET: 25 | 30 days supply | Qty: 60 | Fill #2

## 2017-01-12 MED FILL — AMLODIPINE BESYLATE 10 MG T: 10 | 30 days supply | Qty: 30 | Fill #3

## 2017-01-12 MED FILL — ?LISINOPRIL 40 MG TABLET: 40 MG | 30 days supply | Qty: 30 | Fill #3

## 2017-02-23 ENCOUNTER — Other Ambulatory Visit: Payer: Self-pay | Admitting: Internal Medicine

## 2017-02-23 MED FILL — LISINOPRIL 40 MG TABLET: 40 | 30 days supply | Qty: 30 | Fill #4

## 2017-02-23 MED FILL — CARVEDILOL 25 MG TABLET: 25 | 30 days supply | Qty: 60 | Fill #3

## 2017-02-23 MED FILL — AMLODIPINE BESYLATE 10 MG T: 10 | 30 days supply | Qty: 30 | Fill #4

## 2017-02-23 MED FILL — FUROSEMIDE 40 MG TABLET: 40 | 30 days supply | Qty: 30 | Fill #1

## 2017-02-23 MED FILL — $ADVAIR 100/50 MCG INHALER: 100-50 | 30 days supply | Qty: 60 | Fill #0

## 2017-04-06 ENCOUNTER — Other Ambulatory Visit: Payer: Self-pay | Admitting: Family Medicine

## 2017-04-06 DIAGNOSIS — I5030 Unspecified diastolic (congestive) heart failure: Secondary | ICD-10-CM

## 2017-04-06 DIAGNOSIS — I10 Essential (primary) hypertension: Secondary | ICD-10-CM

## 2017-04-06 MED FILL — CARVEDILOL 25 MG TABLET: 25 | 30 days supply | Qty: 60 | Fill #4

## 2017-04-06 MED FILL — AMLODIPINE BESYLATE 10 MG T: 10 | 30 days supply | Qty: 30 | Fill #5

## 2017-04-06 MED FILL — FUROSEMIDE 40 MG TABLET: 40 | 30 days supply | Qty: 30 | Fill #2

## 2017-04-06 MED FILL — LISINOPRIL 40 MG TABLET: 40 | 30 days supply | Qty: 30 | Fill #5

## 2017-05-23 MED FILL — LISINOPRIL 40 MG TABLET: 40 | 30 days supply | Qty: 30 | Fill #0

## 2017-05-23 MED FILL — ?FUROSEMIDE 40 MG TABLET: 40 | 30 days supply | Qty: 30 | Fill #0

## 2017-05-23 MED FILL — $ADVAIR 100/50 MCG INHALER: 100-50 | 30 days supply | Qty: 60 | Fill #0

## 2017-05-23 MED FILL — ?AMLODIPINE BESYLATE 10 MG: 10 | 30 days supply | Qty: 30 | Fill #0

## 2017-05-23 MED FILL — $VENTOLIN HFA 18G INHALER: 108 (90 BAS | 25 days supply | Qty: 18 | Fill #0

## 2017-05-30 ENCOUNTER — Other Ambulatory Visit: Payer: Self-pay | Admitting: Family Medicine

## 2017-05-30 DIAGNOSIS — I10 Essential (primary) hypertension: Secondary | ICD-10-CM

## 2017-05-30 DIAGNOSIS — I5030 Unspecified diastolic (congestive) heart failure: Secondary | ICD-10-CM

## 2017-06-23 ENCOUNTER — Other Ambulatory Visit: Payer: Self-pay | Admitting: Family Medicine

## 2017-06-23 DIAGNOSIS — I10 Essential (primary) hypertension: Secondary | ICD-10-CM

## 2017-06-23 DIAGNOSIS — I5030 Unspecified diastolic (congestive) heart failure: Secondary | ICD-10-CM

## 2017-06-23 MED FILL — $VENTOLIN HFA 18G INHALER: 108 (90 BAS | 25 days supply | Qty: 18 | Fill #1

## 2017-06-23 MED FILL — $ADVAIR 100/50 MCG INHALER: 100-50 | 30 days supply | Qty: 60 | Fill #1

## 2017-06-24 ENCOUNTER — Other Ambulatory Visit: Payer: Self-pay | Admitting: Family Medicine

## 2017-06-24 DIAGNOSIS — I10 Essential (primary) hypertension: Secondary | ICD-10-CM

## 2017-06-24 DIAGNOSIS — I5030 Unspecified diastolic (congestive) heart failure: Secondary | ICD-10-CM

## 2017-06-28 ENCOUNTER — Other Ambulatory Visit: Payer: Self-pay | Admitting: Family Medicine

## 2017-06-28 DIAGNOSIS — I10 Essential (primary) hypertension: Secondary | ICD-10-CM

## 2017-06-28 DIAGNOSIS — I5030 Unspecified diastolic (congestive) heart failure: Secondary | ICD-10-CM

## 2017-06-28 MED FILL — FUROSEMIDE 40 MG TABLET: 40 | 30 days supply | Qty: 30 | Fill #0

## 2017-06-28 MED FILL — AMLODIPINE BESYLATE 10 MG T: 10 | 30 days supply | Qty: 30 | Fill #0

## 2017-06-28 MED FILL — LISINOPRIL 40 MG TABLET: 40 | 30 days supply | Qty: 30 | Fill #0

## 2017-06-29 ENCOUNTER — Ambulatory Visit: Payer: Self-pay | Attending: Internal Medicine | Admitting: Physician Assistant

## 2017-06-29 VITALS — BP 170/87 | HR 76 | Temp 98.9°F | Resp 18 | Ht 64.0 in | Wt 234.0 lb

## 2017-06-29 DIAGNOSIS — R062 Wheezing: Secondary | ICD-10-CM

## 2017-06-29 DIAGNOSIS — J449 Chronic obstructive pulmonary disease, unspecified: Secondary | ICD-10-CM | POA: Insufficient documentation

## 2017-06-29 DIAGNOSIS — I5032 Chronic diastolic (congestive) heart failure: Secondary | ICD-10-CM

## 2017-06-29 DIAGNOSIS — Z888 Allergy status to other drugs, medicaments and biological substances status: Secondary | ICD-10-CM | POA: Insufficient documentation

## 2017-06-29 DIAGNOSIS — Z79899 Other long term (current) drug therapy: Secondary | ICD-10-CM | POA: Insufficient documentation

## 2017-06-29 DIAGNOSIS — I110 Hypertensive heart disease with heart failure: Secondary | ICD-10-CM | POA: Insufficient documentation

## 2017-06-29 DIAGNOSIS — I5030 Unspecified diastolic (congestive) heart failure: Secondary | ICD-10-CM

## 2017-06-29 DIAGNOSIS — I10 Essential (primary) hypertension: Secondary | ICD-10-CM

## 2017-06-29 DIAGNOSIS — F17200 Nicotine dependence, unspecified, uncomplicated: Secondary | ICD-10-CM

## 2017-06-29 DIAGNOSIS — I700 Atherosclerosis of aorta: Secondary | ICD-10-CM | POA: Insufficient documentation

## 2017-06-29 DIAGNOSIS — E785 Hyperlipidemia, unspecified: Secondary | ICD-10-CM

## 2017-07-04 ENCOUNTER — Encounter: Payer: Self-pay | Admitting: Physician Assistant

## 2017-07-04 MED ORDER — CARVEDILOL 25 MG PO TABS: 25.0000 mg | ORAL_TABLET | Freq: Two times a day (BID) | ORAL | 5 refills | Status: DC

## 2017-07-04 MED ORDER — AMLODIPINE BESYLATE 10 MG PO TABS: 10.0000 mg | ORAL_TABLET | Freq: Every day | ORAL | 5 refills | Status: DC

## 2017-07-04 MED ORDER — LISINOPRIL 40 MG PO TABS: 40.0000 mg | ORAL_TABLET | Freq: Every day | ORAL | 3 refills | Status: DC

## 2017-07-04 MED ORDER — FUROSEMIDE 40 MG PO TABS: ORAL_TABLET | ORAL | 3 refills | Status: DC

## 2017-07-04 MED ORDER — FLUTICASONE-SALMETEROL 100-50 MCG/DOSE IN AEPB: INHALATION_SPRAY | RESPIRATORY_TRACT | 3 refills | Status: DC

## 2017-07-04 MED ORDER — BUPROPION HCL ER (SR) 150 MG PO TB12: ORAL_TABLET | ORAL | 2 refills | Status: DC

## 2017-07-04 MED FILL — ?CARVEDILOL 25 MG TABLET: 25 | 30 days supply | Qty: 60 | Fill #0

## 2017-07-04 NOTE — Progress Notes (Signed)
Shari Obrien, is a 61 y.o. female  [id number]:[id number]  [id number]  DOB - 1956-04-02  Subjective:  Chief Complaint and HPI: Shari Obrien is a 61 y.o. female here today for RF of all medications.  EMR is down at time of visit.  She is overall doing well except for mild SOB over the last few weeks after running out of all of her medications.    ROS:   Constitutional:  No f/c, No night sweats, No unexplained weight loss. EENT:  No vision changes, No blurry vision, No hearing changes. No mouth, throat, or ear problems.  Respiratory: No cough, minimal SOB Cardiac: No CP, no palpitations GI:  No abd pain, No N/V/D. GU: No Urinary s/sx Musculoskeletal: No joint pain Neuro: No headache, no dizziness, no motor weakness.  Skin: No rash Endocrine:  No polydipsia. No polyuria.  Psych: Denies SI/HI  No problems updated.  ALLERGIES: Allergies  Allergen Reactions  . Periactin [Cyproheptadine] Swelling    PAST MEDICAL HISTORY: Past Medical History:  Diagnosis Date  . Aortic atherosclerosis (HCC)   . CHF (congestive heart failure) (HCC)   . COPD (chronic obstructive pulmonary disease) (HCC) Dx 2013  . HTN (hypertension) 08/30/2013  . Hypertension   . Lower leg edema 06/2016    MEDICATIONS AT HOME: Prior to Admission medications   Medication Sig Start Date End Date Taking? Authorizing Provider  albuterol (PROVENTIL HFA;VENTOLIN HFA) 108 (90 Base) MCG/ACT inhaler Inhale 2 puffs into the lungs every 6 (six) hours as needed for wheezing or shortness of breath. 07/30/16   Quentin AngstJegede, Olugbemiga E, MD  amLODipine (NORVASC) 10 MG tablet TAKE 1 TABLET BY MOUTH DAILY. 06/28/17   Dessa PhiFunches, Josalyn, MD  buPROPion (WELLBUTRIN SR) 150 MG 12 hr tablet Take my mouth 150 mg daily for 3 days then twice daily 08/06/16   Dessa PhiFunches, Josalyn, MD  carvedilol (COREG) 25 MG tablet Take 1 tablet (25 mg total) by mouth 2 (two) times daily with a meal. 08/06/16   Funches, Gerilyn NestleJosalyn, MD  cetirizine (ZYRTEC) 10 MG tablet  Take 10 mg by mouth daily as needed for allergies.     [provider]  diclofenac sodium (VOLTAREN) 1 % GEL Apply 2 g topically 4 (four) times daily. 09/30/14   Ambrose FinlandKeck, Valerie A, NP  Fluticasone-Salmeterol (ADVAIR) 100-50 MCG/DOSE AEPB INHALE 1 PUFF INTO THE LUNGS 2 TIMES DAILY. 02/23/17   Funches, Gerilyn NestleJosalyn, MD  furosemide (LASIX) 40 MG tablet TAKE 1 TABLET BY MOUTH DAILY AS NEEDED FOR EDEMA. 06/28/17   Dessa PhiFunches, Josalyn, MD  hydrocortisone cream 0.5 % Apply 1 application topically 2 (two) times daily as needed for itching. 08/06/16   Funches, Gerilyn NestleJosalyn, MD  lisinopril (PRINIVIL,ZESTRIL) 40 MG tablet TAKE 1 TABLET BY MOUTH DAILY. 06/28/17   Funches, Gerilyn NestleJosalyn, MD  triamcinolone (KENALOG) 0.025 % ointment APPLY 1 APPLICATION TOPICALLY 2 TIMES DAILY. 08/06/16   Funches, Gerilyn NestleJosalyn, MD     Objective:  EXAM:   Vitals:   07/04/17 0852  BP: (!) 170/87  Pulse: 76  Resp: 18  Temp: 98.9 F (37.2 C)  TempSrc: Oral  Weight: 234 lb (106.1 kg)  Height: 5\' 4"  (1.626 m)    General appearance : A&OX3. NAD. Non-toxic-appearing HEENT: Atraumatic and Normocephalic.  PERRLA. EOM intact.   Neck: supple, no JVD. No cervical lymphadenopathy. No thyromegaly Chest/Lungs:  Breathing-non-labored, Good air entry bilaterally, fair air movement with mild wheezing throughout; no rales or rhonchi.   CVS: S1 S2 regular, no murmurs, gallops, rubs  Extremities: Bilateral Lower Ext shows  no edema, both legs are warm to touch with = pulse throughout Neurology:  CN II-XII grossly intact, Non focal.   Psych:  TP linear. J/I WNL. Normal speech. Appropriate eye contact and affect.  Skin:  No Rash  Data Review Lab Results  Component Value Date   HGBA1C 6.4 (H) 01/19/2016   HGBA1C 6.5 (H) 09/23/2014     Assessment & Plan   1. Essential hypertension Uncontrolled but out of all meds.  Resume medications.  All prescriptions RF after EMR was back up.  Future oders for labs-CMP,CBC, and lipids to be drawn at her earliest  convenience after today.    2. Diastolic heart failure, unspecified HF chronicity (HCC) Stable-all meds RF when Epic access was restored  3. Current smoker Cessation imperative-provided resources   Patient have been counseled extensively about nutrition and exercise  Return in about 3 weeks (around 07/20/2017) for assign new PCP; recheck BP and HF.  The patient was given clear instructions to go to ER or return to medical center if symptoms don't improve, worsen or new problems develop. The patient verbalized understanding. The patient was told to call to get lab results if they haven't heard anything in the next week.     Georgian Co, PA-C [facility] and [facility] [place], [place] [phone]   07/04/2017, 1:24 PMPatient ID: Shari Obrien, female   DOB: 1956/10/24, 61 y.o.   MRN: [id number]

## 2017-07-08 ENCOUNTER — Ambulatory Visit: Payer: Self-pay | Attending: Internal Medicine

## 2017-07-08 ENCOUNTER — Encounter: Payer: Self-pay | Admitting: *Deleted

## 2017-07-08 DIAGNOSIS — I10 Essential (primary) hypertension: Secondary | ICD-10-CM | POA: Insufficient documentation

## 2017-07-08 DIAGNOSIS — E785 Hyperlipidemia, unspecified: Secondary | ICD-10-CM | POA: Insufficient documentation

## 2017-07-08 NOTE — Progress Notes (Signed)
Patient here for lab visit  

## 2017-07-09 LAB — COMPREHENSIVE METABOLIC PANEL
ALBUMIN: 3.7 g/dL (ref 3.6–4.8)
ALT: 15 IU/L (ref 0–32)
AST: 21 IU/L (ref 0–40)
Albumin/Globulin Ratio: 1.3 (ref 1.2–2.2)
Alkaline Phosphatase: 63 IU/L (ref 39–117)
BILIRUBIN TOTAL: 0.7 mg/dL (ref 0.0–1.2)
BUN / CREAT RATIO: 19 (ref 12–28)
BUN: 16 mg/dL (ref 8–27)
CALCIUM: 8.5 mg/dL — AB (ref 8.7–10.3)
CHLORIDE: 102 mmol/L (ref 96–106)
CO2: 25 mmol/L (ref 20–29)
CREATININE: 0.84 mg/dL (ref 0.57–1.00)
GFR, EST AFRICAN AMERICAN: 86 mL/min/{1.73_m2} (ref >59)
GFR, EST NON AFRICAN AMERICAN: 75 mL/min/{1.73_m2} (ref >59)
GLUCOSE: 90 mg/dL (ref 65–99)
Globulin, Total: 2.8 g/dL (ref 1.5–4.5)
Potassium: 4.4 mmol/L (ref 3.5–5.2)
Sodium: 145 mmol/L — ABNORMAL HIGH (ref 134–144)
TOTAL PROTEIN: 6.5 g/dL (ref 6.0–8.5)

## 2017-07-09 LAB — CBC WITH DIFFERENTIAL/PLATELET
BASOS ABS: 0 10*3/uL (ref 0.0–0.2)
Basos: 1 %
EOS (ABSOLUTE): 0.3 10*3/uL (ref 0.0–0.4)
EOS: 7 %
Hematocrit: 61.6 % — ABNORMAL HIGH (ref 34.0–46.6)
Hemoglobin: 19.9 g/dL — ABNORMAL HIGH (ref 11.1–15.9)
IMMATURE GRANS (ABS): 0 10*3/uL (ref 0.0–0.1)
IMMATURE GRANULOCYTES: 0 %
Lymphocytes Absolute: 1.4 10*3/uL (ref 0.7–3.1)
Lymphs: 34 %
MCH: 29.4 pg (ref 26.6–33.0)
MCHC: 32.3 g/dL (ref 31.5–35.7)
MCV: 91 fL (ref 79–97)
Monocytes Absolute: 0.4 10*3/uL (ref 0.1–0.9)
Monocytes: 8 %
NEUTROS PCT: 50 %
Neutrophils Absolute: 2.1 10*3/uL (ref 1.4–7.0)
PLATELETS: 169 10*3/uL (ref 150–379)
RBC: 6.77 x10E6/uL — AB (ref 3.77–5.28)
RDW: 15.6 % — ABNORMAL HIGH (ref 12.3–15.4)
WBC: 4.2 10*3/uL (ref 3.4–10.8)

## 2017-07-09 LAB — LIPID PANEL
Chol/HDL Ratio: 4 ratio (ref 0.0–4.4)
Cholesterol, Total: 145 mg/dL (ref 100–199)
HDL: 36 mg/dL — AB (ref >39)
LDL CALC: 95 mg/dL (ref 0–99)
Triglycerides: 69 mg/dL (ref 0–149)
VLDL CHOLESTEROL CAL: 14 mg/dL (ref 5–40)

## 2017-07-11 ENCOUNTER — Other Ambulatory Visit: Payer: Self-pay | Admitting: Physician Assistant

## 2017-07-11 DIAGNOSIS — D751 Secondary polycythemia: Secondary | ICD-10-CM

## 2017-07-13 ENCOUNTER — Telehealth: Payer: Self-pay | Admitting: *Deleted

## 2017-07-13 NOTE — Telephone Encounter (Signed)
Notes recorded by Anders Simmonds, PA-C on 07/11/2017 at 3:30 PM EDT Please call patient and tell her that her labs are overall good except for a high hemoglobin. This may be related to smoking but needs further work-up and possible treatment. I have referred her to A hematologist. Treatment is usually to draw hemoglobin out of the blood to keep it at normal levels for carrying oxygen more effectively.  Thanks, Georgian Co, PA-C  Left message on voicemail for pt. to call back.

## 2017-07-15 ENCOUNTER — Encounter: Payer: Self-pay | Admitting: *Deleted

## 2017-07-15 NOTE — Telephone Encounter (Signed)
Left message on voicemail to return call.

## 2017-08-03 ENCOUNTER — Other Ambulatory Visit: Payer: Self-pay | Admitting: Family Medicine

## 2017-08-03 DIAGNOSIS — I5030 Unspecified diastolic (congestive) heart failure: Secondary | ICD-10-CM

## 2017-08-03 DIAGNOSIS — I10 Essential (primary) hypertension: Secondary | ICD-10-CM

## 2017-08-04 ENCOUNTER — Other Ambulatory Visit: Payer: Self-pay | Admitting: Internal Medicine

## 2017-08-08 MED FILL — $ADVAIR 100/50 MCG INHALER: 100-50 | 30 days supply | Qty: 60 | Fill #0

## 2017-08-08 MED FILL — AMLODIPINE BESYLATE 10 MG T: 10 | 30 days supply | Qty: 30 | Fill #0

## 2017-08-08 MED FILL — LISINOPRIL 40 MG TABLET: 40 | 30 days supply | Qty: 30 | Fill #0

## 2017-08-08 MED FILL — $VENTOLIN HFA 18G INHALER: 108 (90 BAS | 25 days supply | Qty: 18 | Fill #0

## 2017-08-08 MED FILL — FUROSEMIDE 40 MG TABLET: 40 | 30 days supply | Qty: 30 | Fill #0

## 2017-08-15 ENCOUNTER — Ambulatory Visit: Payer: Self-pay | Attending: Family Medicine | Admitting: Family Medicine

## 2017-08-15 ENCOUNTER — Encounter: Payer: Self-pay | Admitting: Family Medicine

## 2017-08-15 VITALS — BP 133/64 | HR 74 | Temp 98.3°F | Ht 64.0 in | Wt 235.6 lb

## 2017-08-15 DIAGNOSIS — R600 Localized edema: Secondary | ICD-10-CM | POA: Insufficient documentation

## 2017-08-15 DIAGNOSIS — I110 Hypertensive heart disease with heart failure: Secondary | ICD-10-CM | POA: Insufficient documentation

## 2017-08-15 DIAGNOSIS — Z79899 Other long term (current) drug therapy: Secondary | ICD-10-CM | POA: Insufficient documentation

## 2017-08-15 DIAGNOSIS — Z888 Allergy status to other drugs, medicaments and biological substances status: Secondary | ICD-10-CM | POA: Insufficient documentation

## 2017-08-15 DIAGNOSIS — J449 Chronic obstructive pulmonary disease, unspecified: Secondary | ICD-10-CM | POA: Insufficient documentation

## 2017-08-15 DIAGNOSIS — I10 Essential (primary) hypertension: Secondary | ICD-10-CM

## 2017-08-15 DIAGNOSIS — J438 Other emphysema: Secondary | ICD-10-CM | POA: Insufficient documentation

## 2017-08-15 DIAGNOSIS — F17200 Nicotine dependence, unspecified, uncomplicated: Secondary | ICD-10-CM

## 2017-08-15 DIAGNOSIS — F17210 Nicotine dependence, cigarettes, uncomplicated: Secondary | ICD-10-CM | POA: Insufficient documentation

## 2017-08-15 DIAGNOSIS — I5032 Chronic diastolic (congestive) heart failure: Secondary | ICD-10-CM | POA: Insufficient documentation

## 2017-08-15 MED ORDER — FUROSEMIDE 40 MG PO TABS: ORAL_TABLET | ORAL | 3 refills | Status: DC

## 2017-08-15 MED ORDER — ALBUTEROL SULFATE HFA 108 (90 BASE) MCG/ACT IN AERS: INHALATION_SPRAY | RESPIRATORY_TRACT | 3 refills | Status: DC

## 2017-08-15 MED ORDER — FLUTICASONE-SALMETEROL 100-50 MCG/DOSE IN AEPB: INHALATION_SPRAY | RESPIRATORY_TRACT | 5 refills | Status: DC

## 2017-08-15 MED ORDER — AMLODIPINE BESYLATE 10 MG PO TABS: 10.0000 mg | ORAL_TABLET | Freq: Every day | ORAL | 5 refills | Status: DC

## 2017-08-15 MED ORDER — LISINOPRIL 40 MG PO TABS: 40.0000 mg | ORAL_TABLET | Freq: Every day | ORAL | 3 refills | Status: DC

## 2017-08-15 NOTE — Patient Instructions (Signed)
Steps to Quit Smoking Smoking tobacco can be bad for your health. It can also affect almost every organ in your body. Smoking puts you and people around you at risk for many serious long-lasting (chronic) diseases. Quitting smoking is hard, but it is one of the best things that you can do for your health. It is never too late to quit. What are the benefits of quitting smoking? When you quit smoking, you lower your risk for getting serious diseases and conditions. They can include:  Lung cancer or lung disease.  Heart disease.  Stroke.  Heart attack.  Not being able to have children (infertility).  Weak bones (osteoporosis) and broken bones (fractures).  If you have coughing, wheezing, and shortness of breath, those symptoms may get better when you quit. You may also get sick less often. If you are pregnant, quitting smoking can help to lower your chances of having a baby of low birth weight. What can I do to help me quit smoking? Talk with your doctor about what can help you quit smoking. Some things you can do (strategies) include:  Quitting smoking totally, instead of slowly cutting back how much you smoke over a period of time.  Going to in-person counseling. You are more likely to quit if you go to many counseling sessions.  Using resources and support systems, such as: ? Online chats with a counselor. ? Phone quitlines. ? Printed self-help materials. ? Support groups or group counseling. ? Text messaging programs. ? Mobile phone apps or applications.  Taking medicines. Some of these medicines may have nicotine in them. If you are pregnant or breastfeeding, do not take any medicines to quit smoking unless your doctor says it is okay. Talk with your doctor about counseling or other things that can help you.  Talk with your doctor about using more than one strategy at the same time, such as taking medicines while you are also going to in-person counseling. This can help make  quitting easier. What things can I do to make it easier to quit? Quitting smoking might feel very hard at first, but there is a lot that you can do to make it easier. Take these steps:  Talk to your family and friends. Ask them to support and encourage you.  Call phone quitlines, reach out to support groups, or work with a counselor.  Ask people who smoke to not smoke around you.  Avoid places that make you want (trigger) to smoke, such as: ? Bars. ? Parties. ? Smoke-break areas at work.  Spend time with people who do not smoke.  Lower the stress in your life. Stress can make you want to smoke. Try these things to help your stress: ? Getting regular exercise. ? Deep-breathing exercises. ? Yoga. ? Meditating. ? Doing a body scan. To do this, close your eyes, focus on one area of your body at a time from head to toe, and notice which parts of your body are tense. Try to relax the muscles in those areas.  Download or buy apps on your mobile phone or tablet that can help you stick to your quit plan. There are many free apps, such as QuitGuide from the CDC (Centers for Disease Control and Prevention). You can find more support from smokefree.gov and other websites.  This information is not intended to replace advice given to you by your health care provider. Make sure you discuss any questions you have with your health care provider. Document Released: 09/04/2009 Document   Revised: 07/06/2016 Document Reviewed: 03/25/2015 Elsevier Interactive Patient Education  2018 Elsevier Inc.  

## 2017-08-15 NOTE — Progress Notes (Signed)
Subjective:  Patient ID: Shari Obrien, female    DOB: 1956/01/10  Age: 61 y.o. MRN: [id number]  CC: Hypertension   HPI Shari Obrien is a 61 year old female with history of hypertension, COPD, diastolic CHF (EF 40-98% from 2-D echo of 06/2016) who presents today to get established with me.  She has not been compliant with her medications and complains of shortness of breath associated with her COPD as she has been unable to afford her inhalers. This is due to her being out of work and she complains of worsening COPD preventing her from being able to keep a job. She denies chest pains, wheezing.  This has worsened her anxiety and has led to increased smoking - she smokes half a pack of cigarettes today but when discussing her anxiety she declined initiation of medications and would not like to speak with the LCSW or partake in counseling.  She has chronic pedal edema which has worsened ever since she has become more sedentary as her feet are usually in the dependent position. She has been using compression stockings.  2-D echo from 06/2016: Is a 65-70%, no regional wall motion abnormalities, grade 2 diastolic dysfunction.  Past Medical History:  Diagnosis Date  . Aortic atherosclerosis (HCC)   . CHF (congestive heart failure) (HCC)   . COPD (chronic obstructive pulmonary disease) (HCC) Dx 2013  . HTN (hypertension) 08/30/2013  . Hypertension   . Lower leg edema 06/2016    Past Surgical History:  Procedure Laterality Date  . ABDOMINAL HYSTERECTOMY    . BACK SURGERY      Allergies  Allergen Reactions  . Periactin [Cyproheptadine] Swelling     Outpatient Medications Prior to Visit  Medication Sig Dispense Refill  . carvedilol (COREG) 25 MG tablet Take 1 tablet (25 mg total) by mouth 2 (two) times daily with a meal. 60 tablet 5  . diclofenac sodium (VOLTAREN) 1 % GEL Apply 2 g topically 4 (four) times daily. 100 g 2  . ADVAIR DISKUS 100-50 MCG/DOSE AEPB INHALE 1 PUFF INTO  THE LUNGS 2 TIMES DAILY 60 each 0  . amLODipine (NORVASC) 10 MG tablet Take 1 tablet (10 mg total) by mouth daily. 30 tablet 5  . furosemide (LASIX) 40 MG tablet TAKE 1 TABLET BY MOUTH DAILY AS NEEDED FOR EDEMA. 30 tablet 3  . lisinopril (PRINIVIL,ZESTRIL) 40 MG tablet Take 1 tablet (40 mg total) by mouth daily. 30 tablet 3  . VENTOLIN HFA 108 (90 Base) MCG/ACT inhaler INHALE 2 PUFFS INTO THE LUNGS EVERY 6 HOURS AS NEEDED FOR WHEEZING OR SHORTNESS OF BREATH 18 g 0  . cetirizine (ZYRTEC) 10 MG tablet Take 10 mg by mouth daily as needed for allergies.     Marland Kitchen triamcinolone (KENALOG) 0.025 % ointment APPLY 1 APPLICATION TOPICALLY 2 TIMES DAILY. (Patient not taking: Reported on 08/15/2017) 80 g 5  . buPROPion (WELLBUTRIN SR) 150 MG 12 hr tablet Take my mouth 150 mg daily for 3 days then twice daily (Patient not taking: Reported on 08/15/2017) 60 tablet 2  . hydrocortisone cream 0.5 % Apply 1 application topically 2 (two) times daily as needed for itching. (Patient not taking: Reported on 08/15/2017) 56 g 5   No facility-administered medications prior to visit.     ROS Review of Systems  Constitutional: Negative for activity change, appetite change and fatigue.  HENT: Negative for congestion, sinus pressure and sore throat.   Eyes: Negative for visual disturbance.  Respiratory: Positive for shortness of breath.  Negative for cough, chest tightness and wheezing.   Cardiovascular: Positive for leg swelling. Negative for chest pain and palpitations.  Gastrointestinal: Negative for abdominal distention, abdominal pain and constipation.  Endocrine: Negative for polydipsia.  Genitourinary: Negative for dysuria and frequency.  Musculoskeletal: Negative for arthralgias and back pain.  Skin: Negative for rash.  Neurological: Negative for tremors, light-headedness and numbness.  Hematological: Does not bruise/bleed easily.  Psychiatric/Behavioral: Negative for agitation and behavioral problems.     Objective:  BP 133/64   Pulse 74   Temp 98.3 F (36.8 C) (Oral)   Ht  (1.626 m)   Wt 235 lb 9.6 oz (106.9 kg)   SpO2 91%   BMI 40.44 kg/m   BP/Weight 08/15/2017 06/29/2017 08/06/2016  Systolic BP 133 170 170  Diastolic BP 64 87 75  Wt. (Lbs) 235.6 234 225  BMI 40.44 40.17 38.62      Physical Exam  Constitutional: She is oriented to person, place, and time. She appears well-developed and well-nourished.  Cardiovascular: Normal rate, normal heart sounds and intact distal pulses.   No murmur heard. Pulmonary/Chest: Effort normal and breath sounds normal. She has no wheezes. She has no rales. She exhibits no tenderness.  Abdominal: Soft. Bowel sounds are normal. She exhibits no distension and no mass. There is no tenderness.  Musculoskeletal: Normal range of motion. She exhibits edema (2+ bilateral nonpitting pedal edema ).  Neurological: She is alert and oriented to person, place, and time.     Assessment & Plan:   1. Essential hypertension Controlled Low-sodium diet - furosemide (LASIX) 40 MG tablet; TAKE 1 TABLET BY MOUTH DAILY AS NEEDED FOR EDEMA.  Dispense: 30 tablet; Refill: 3 - lisinopril (PRINIVIL,ZESTRIL) 40 MG tablet; Take 1 tablet (40 mg total) by mouth daily.  Dispense: 30 tablet; Refill: 3 - amLODipine (NORVASC) 10 MG tablet; Take 1 tablet (10 mg total) by mouth daily.  Dispense: 30 tablet; Refill: 5  2. Chronic diastolic heart failure (HCC) EF of 65-70% from 2-D echo 06/2016 Euvolemic - furosemide (LASIX) 40 MG tablet; TAKE 1 TABLET BY MOUTH DAILY AS NEEDED FOR EDEMA.  Dispense: 30 tablet; Refill: 3  3. Current smoker Smoking cessation support: smoking cessation hotline: 1-800-QUIT-NOW.  Smoking cessation classes are available through [facility]. Call [phone] or visit our website at [email or web address].  Spent 3 minutes counseling on smoking cessation and patient is not ready to quit.   4. Other emphysema  (HCC) Recurrent exacerbations due to inability to afford medications Informed patient of assistance available through the pharmacy in-house - albuterol (VENTOLIN HFA) 108 (90 Base) MCG/ACT inhaler; INHALE 2 PUFFS INTO THE LUNGS EVERY 6 HOURS AS NEEDED FOR WHEEZING OR SHORTNESS OF BREATH  Dispense: 18 g; Refill: 3 - Fluticasone-Salmeterol (ADVAIR DISKUS) 100-50 MCG/DOSE AEPB; INHALE 1 PUFF INTO THE LUNGS 2 TIMES DAILY  Dispense: 60 each; Refill: 5  5. Lower leg edema Likely dependent Advised to use compression stockings, low-sodium diet Discussed discontinuation of amlodipine which she is hesitant to do due to current blood pressure control   Meds ordered this encounter  Medications  . albuterol (VENTOLIN HFA) 108 (90 Base) MCG/ACT inhaler    Sig: INHALE 2 PUFFS INTO THE LUNGS EVERY 6 HOURS AS NEEDED FOR WHEEZING OR SHORTNESS OF BREATH    Dispense:  18 g    Refill:  3  . furosemide (LASIX) 40 MG tablet    Sig: TAKE 1 TABLET BY MOUTH DAILY AS NEEDED FOR EDEMA.  Dispense:  30 tablet    Refill:  3  . Fluticasone-Salmeterol (ADVAIR DISKUS) 100-50 MCG/DOSE AEPB    Sig: INHALE 1 PUFF INTO THE LUNGS 2 TIMES DAILY    Dispense:  60 each    Refill:  5  . lisinopril (PRINIVIL,ZESTRIL) 40 MG tablet    Sig: Take 1 tablet (40 mg total) by mouth daily.    Dispense:  30 tablet    Refill:  3  . amLODipine (NORVASC) 10 MG tablet    Sig: Take 1 tablet (10 mg total) by mouth daily.    Dispense:  30 tablet    Refill:  5    Follow-up: Return in about 3 months (around 11/14/2017) for Follow-up of COPD and hypertension.   Jaclyn Shaggy MD

## 2017-08-17 ENCOUNTER — Encounter: Payer: Self-pay | Admitting: Hematology

## 2017-08-17 ENCOUNTER — Telehealth: Payer: Self-pay | Admitting: Hematology

## 2017-08-17 NOTE — Telephone Encounter (Signed)
Appt has been scheduled for the pt to see Dr. Candise Che on 10/16 at 11am. Unable to reach the vm , her mailbox was full. Letter mailed to the pt and notified the referring to contact the pt about the appt.

## 2017-09-06 ENCOUNTER — Encounter: Payer: Self-pay | Admitting: Hematology

## 2017-09-15 MED FILL — AMLODIPINE BESYLATE 10 MG T: 10 | 30 days supply | Qty: 30 | Fill #1

## 2017-09-15 MED FILL — LISINOPRIL 40 MG TABLET: 40 | 30 days supply | Qty: 30 | Fill #1

## 2017-09-15 MED FILL — ?FUROSEMIDE 40 MG TABLET: 40 | 30 days supply | Qty: 30 | Fill #1

## 2017-10-25 MED FILL — LISINOPRIL 40 MG TAB: 40 | 30 days supply | Qty: 30 | Fill #2

## 2017-10-25 MED FILL — AMLODIPINE BESYLATE 10 MG T: 10 | 30 days supply | Qty: 30 | Fill #2

## 2017-10-25 MED FILL — ?FUROSEMIDE 40 MG TABLET: 40 | 30 days supply | Qty: 30 | Fill #2

## 2017-12-08 MED FILL — LISINOPRIL 40 MG TAB: 40 | 30 days supply | Qty: 30 | Fill #3

## 2017-12-08 MED FILL — AMLODIPINE BESYLATE 10 MG T: 10 | 30 days supply | Qty: 30 | Fill #3

## 2017-12-08 MED FILL — ?FUROSEMIDE 40 MG TABLET: 40 | 30 days supply | Qty: 30 | Fill #3

## 2017-12-08 MED FILL — !VENTOLIN HFA INHALER: 108 (90 BAS | 25 days supply | Qty: 18 | Fill #0

## 2017-12-08 MED FILL — !ADVAIR 100/50 DISKUS: 100-50 | 30 days supply | Qty: 60 | Fill #0

## 2018-01-13 MED FILL — ?FUROSEMIDE 40 MG TABLET: 40 | 30 days supply | Qty: 30 | Fill #0

## 2018-01-13 MED FILL — LISINOPRIL 40 MG TAB: 40 | 30 days supply | Qty: 30 | Fill #0

## 2018-01-13 MED FILL — ?AMLODIPINE BESYLATE 10MG T: 10 | 30 days supply | Qty: 30 | Fill #4

## 2018-02-20 MED FILL — AMLODIPINE BESYLATE 10 MG T: 10 | 30 days supply | Qty: 30 | Fill #5

## 2018-02-20 MED FILL — $ADVAIR 100/50 MCG INHALER: 100-50 | 30 days supply | Qty: 60 | Fill #1

## 2018-02-20 MED FILL — ?FUROSEMIDE 40 MG TABLET: 40 | 30 days supply | Qty: 30 | Fill #1

## 2018-02-20 MED FILL — LISINOPRIL 40 MG TAB: 40 | 30 days supply | Qty: 30 | Fill #1

## 2018-02-20 MED FILL — $VENTOLIN HFA 18G INHALER: 108 (90 BAS | 25 days supply | Qty: 18 | Fill #1

## 2018-03-30 MED FILL — LISINOPRIL 40 MG TAB: 40 | 30 days supply | Qty: 30 | Fill #2

## 2018-03-30 MED FILL — $VENTOLIN HFA 18G INHALER: 108 (90 BAS | 25 days supply | Qty: 18 | Fill #2

## 2018-03-30 MED FILL — AMLODIPINE BESYLATE 10 MG T: 10 | 30 days supply | Qty: 30 | Fill #0

## 2018-03-30 MED FILL — $ADVAIR 100/50 MCG INHALER: 100-50 | 30 days supply | Qty: 60 | Fill #2

## 2018-03-30 MED FILL — CARVEDILOL 25 MG TABLET: 25 | 30 days supply | Qty: 60 | Fill #1

## 2018-03-30 MED FILL — FUROSEMIDE 40 MG TAB: 40 | 30 days supply | Qty: 30 | Fill #2

## 2018-05-29 MED FILL — FUROSEMIDE 40 MG TAB: 40 | 30 days supply | Qty: 30 | Fill #3

## 2018-05-29 MED FILL — AMLODIPINE BESYLATE 10 MG T: 10 | 30 days supply | Qty: 30 | Fill #1

## 2018-05-29 MED FILL — LISINOPRIL 40 MG TAB: 40 | 30 days supply | Qty: 30 | Fill #3

## 2018-05-29 MED FILL — $VENTOLIN HFA 18G INHALER: 108 (90 BAS | 25 days supply | Qty: 18 | Fill #3

## 2018-05-29 MED FILL — $ADVAIR 100/50 MCG INHALER: 100-50 | 90 days supply | Qty: 180 | Fill #3

## 2018-06-26 ENCOUNTER — Other Ambulatory Visit: Payer: Self-pay | Admitting: Family Medicine

## 2018-06-26 DIAGNOSIS — I10 Essential (primary) hypertension: Secondary | ICD-10-CM

## 2018-06-26 DIAGNOSIS — I5032 Chronic diastolic (congestive) heart failure: Secondary | ICD-10-CM

## 2018-06-26 MED FILL — FUROSEMIDE 40 MG TAB: 40 | 30 days supply | Qty: 30 | Fill #0

## 2018-06-26 MED FILL — LISINOPRIL 40 MG TABLET: 40 | 30 days supply | Qty: 30 | Fill #0

## 2018-06-26 MED FILL — AMLODIPINE BESYLATE 10 MG T: 10 | 30 days supply | Qty: 30 | Fill #2

## 2018-07-04 ENCOUNTER — Ambulatory Visit: Payer: Self-pay | Attending: Family Medicine | Admitting: Licensed Clinical Social Worker

## 2018-07-04 ENCOUNTER — Encounter: Payer: Self-pay | Admitting: Family Medicine

## 2018-07-04 ENCOUNTER — Ambulatory Visit: Payer: Self-pay | Attending: Family Medicine | Admitting: Family Medicine

## 2018-07-04 VITALS — BP 141/82 | HR 87 | Temp 98.1°F | Ht 64.0 in | Wt 230.6 lb

## 2018-07-04 DIAGNOSIS — J449 Chronic obstructive pulmonary disease, unspecified: Secondary | ICD-10-CM | POA: Insufficient documentation

## 2018-07-04 DIAGNOSIS — F4323 Adjustment disorder with mixed anxiety and depressed mood: Secondary | ICD-10-CM

## 2018-07-04 DIAGNOSIS — I110 Hypertensive heart disease with heart failure: Secondary | ICD-10-CM | POA: Insufficient documentation

## 2018-07-04 DIAGNOSIS — Z79899 Other long term (current) drug therapy: Secondary | ICD-10-CM | POA: Insufficient documentation

## 2018-07-04 DIAGNOSIS — F17200 Nicotine dependence, unspecified, uncomplicated: Secondary | ICD-10-CM

## 2018-07-04 DIAGNOSIS — J438 Other emphysema: Secondary | ICD-10-CM

## 2018-07-04 DIAGNOSIS — Z1159 Encounter for screening for other viral diseases: Secondary | ICD-10-CM

## 2018-07-04 DIAGNOSIS — Z1211 Encounter for screening for malignant neoplasm of colon: Secondary | ICD-10-CM

## 2018-07-04 DIAGNOSIS — I700 Atherosclerosis of aorta: Secondary | ICD-10-CM | POA: Insufficient documentation

## 2018-07-04 DIAGNOSIS — I5032 Chronic diastolic (congestive) heart failure: Secondary | ICD-10-CM

## 2018-07-04 DIAGNOSIS — I10 Essential (primary) hypertension: Secondary | ICD-10-CM

## 2018-07-04 DIAGNOSIS — Z7951 Long term (current) use of inhaled steroids: Secondary | ICD-10-CM | POA: Insufficient documentation

## 2018-07-04 DIAGNOSIS — D751 Secondary polycythemia: Secondary | ICD-10-CM | POA: Insufficient documentation

## 2018-07-04 DIAGNOSIS — F17210 Nicotine dependence, cigarettes, uncomplicated: Secondary | ICD-10-CM | POA: Insufficient documentation

## 2018-07-04 MED ORDER — FUROSEMIDE 40 MG PO TABS: 40.0000 mg | ORAL_TABLET | Freq: Every day | ORAL | 1 refills | Status: DC

## 2018-07-04 MED ORDER — FLUTICASONE-SALMETEROL 100-50 MCG/DOSE IN AEPB: 1.0000 | INHALATION_SPRAY | Freq: Two times a day (BID) | RESPIRATORY_TRACT | 6 refills | Status: DC

## 2018-07-04 MED ORDER — AMLODIPINE BESYLATE 10 MG PO TABS: 10.0000 mg | ORAL_TABLET | Freq: Every day | ORAL | 6 refills | Status: DC

## 2018-07-04 MED ORDER — ALBUTEROL SULFATE HFA 108 (90 BASE) MCG/ACT IN AERS: 2.0000 | INHALATION_SPRAY | Freq: Four times a day (QID) | RESPIRATORY_TRACT | 3 refills | Status: DC | PRN

## 2018-07-04 MED ORDER — LISINOPRIL 40 MG PO TABS: 40.0000 mg | ORAL_TABLET | Freq: Every day | ORAL | 6 refills | Status: DC

## 2018-07-04 MED FILL — FUROSEMIDE 40 MG TAB: 40 | 30 days supply | Qty: 30 | Fill #0

## 2018-07-04 MED FILL — LISINOPRIL 40 MG TABLET: 40 | 30 days supply | Qty: 30 | Fill #0

## 2018-07-04 NOTE — Progress Notes (Signed)
Subjective:  Patient ID: Shari Obrien, female    DOB: 1956/01/30  Age: 62 y.o. MRN: [id number]  CC: Hypertension   HPI Shari Obrien  is a 62 year old female with history of hypertension, COPD, diastolic CHF (EF 29-51% from 2-D echo of 06/2016) who presents today for a follow-up visit. She has been out of her inhalers and has noticed worsening shortness of breath.  This has been due to financial constraints as she recently had to quit her job due to strong colognes at work which caused her difficulty breathing. She continues to smoke about half a pack of cigarettes a day. Review of her chart indicates polycythemia for which she had been referred to Oncology but never followed through due to fear. She denies pedal edema, orthopnea or recent weight gain. Has also been out of her antihypertensives. She is yet to undergo colonoscopy and mammogram as she does not have the [place] financial aid and has been unable to apply for this due to lack of the tax statement required as she has not filed her taxes in a while.  Past Medical History:  Diagnosis Date  . Aortic atherosclerosis ([place])   . CHF (congestive heart failure) ([place])   . COPD (chronic obstructive pulmonary disease) ([place]) Dx 2013  . HTN (hypertension) 08/30/2013  . Hypertension   . Lower leg edema 06/2016    Past Surgical History:  Procedure Laterality Date  . ABDOMINAL HYSTERECTOMY    . BACK SURGERY      Allergies  Allergen Reactions  . Periactin [Cyproheptadine] Swelling     Outpatient Medications Prior to Visit  Medication Sig Dispense Refill  . diclofenac sodium (VOLTAREN) 1 % GEL Apply 2 g topically 4 (four) times daily. 100 g 2  . albuterol (VENTOLIN HFA) 108 (90 Base) MCG/ACT inhaler INHALE 2 PUFFS INTO THE LUNGS EVERY 6 HOURS AS NEEDED FOR WHEEZING OR SHORTNESS OF BREATH 18 g 3  . amLODipine (NORVASC) 10 MG tablet Take 1 tablet (10 mg total) by mouth daily. 30 tablet 5  . Fluticasone-Salmeterol (ADVAIR  DISKUS) 100-50 MCG/DOSE AEPB INHALE 1 PUFF INTO THE LUNGS 2 TIMES DAILY 60 each 5  . furosemide (LASIX) 40 MG tablet TAKE 1 TABLET BY MOUTH DAILY AS NEEDED FOR EDEMA. MUST MAKE APPT FOR FURTHER REFILLS 30 tablet 0  . lisinopril (PRINIVIL,ZESTRIL) 40 MG tablet Take 1 tablet (40 mg total) by mouth daily. MUST MAKE APPT FOR FURTHER REFILLS 30 tablet 0  . cetirizine (ZYRTEC) 10 MG tablet Take 10 mg by mouth daily as needed for allergies.     Marland Kitchen triamcinolone (KENALOG) 0.025 % ointment APPLY 1 APPLICATION TOPICALLY 2 TIMES DAILY. (Patient not taking: Reported on 08/15/2017) 80 g 5  . carvedilol (COREG) 25 MG tablet Take 1 tablet (25 mg total) by mouth 2 (two) times daily with a meal. (Patient not taking: Reported on 07/04/2018) 60 tablet 5   No facility-administered medications prior to visit.     ROS Review of Systems  Constitutional: Negative for activity change, appetite change and fatigue.  HENT: Negative for congestion, sinus pressure and sore throat.   Eyes: Negative for visual disturbance.  Respiratory: Positive for shortness of breath. Negative for cough, chest tightness and wheezing.   Cardiovascular: Negative for chest pain and palpitations.  Gastrointestinal: Negative for abdominal distention, abdominal pain and constipation.  Endocrine: Negative for polydipsia.  Genitourinary: Negative for dysuria and frequency.  Musculoskeletal: Negative for arthralgias and back pain.  Skin: Negative for rash.  Neurological:  Negative for tremors, light-headedness and numbness.  Hematological: Does not bruise/bleed easily.  Psychiatric/Behavioral: Negative for agitation and behavioral problems.    Objective:  BP (!) 141/82   Pulse 87   Temp 98.1 F (36.7 C) (Oral)   Ht _0  (1.626 m)   Wt 230 lb 9.6 oz (104.6 kg)   SpO2 90%   BMI 39.58 kg/m   BP/Weight 07/04/2018 03/08/4080 02/24/8184  Systolic BP 631 497 026  Diastolic BP 82 64 87  Wt. (Lbs) 230.6 235.6 234  BMI 39.58 40.44 40.17       Physical Exam  Constitutional: She is oriented to person, place, and time. She appears well-developed and well-nourished.  Cardiovascular: Normal rate, normal heart sounds and intact distal pulses.  No murmur heard. Pulmonary/Chest: Effort normal and breath sounds normal. She has no wheezes. She has no rales. She exhibits no tenderness.  Abdominal: Soft. Bowel sounds are normal. She exhibits no distension and no mass. There is no tenderness.  Musculoskeletal: Normal range of motion.  Neurological: She is alert and oriented to person, place, and time.  Skin: Skin is warm and dry.  Psychiatric: She has a normal mood and affect.     Assessment & Plan:   1. Other emphysema ([place]) Uncontrolled due to running out of inhalers Refilled inhalers today and encouraged to quit smoking - albuterol (VENTOLIN HFA) 108 (90 Base) MCG/ACT inhaler; Inhale 2 puffs into the lungs every 6 (six) hours as needed for wheezing or shortness of breath.  Dispense: 18 g; Refill: 3 - Fluticasone-Salmeterol (ADVAIR DISKUS) 100-50 MCG/DOSE AEPB; Inhale 1 puff into the lungs 2 (two) times daily.  Dispense: 60 each; Refill: 6  2. Essential hypertension Slight systolic elevation No regimen change today Counseled on blood pressure goal of less than 130/80, low-sodium, DASH diet, medication compliance, 150 minutes of moderate intensity exercise per week. Discussed medication compliance, adverse effects. - furosemide (LASIX) 40 MG tablet; Take 1 tablet (40 mg total) by mouth daily. As needed for pedal edema  Dispense: 30 tablet; Refill: 1 - lisinopril (PRINIVIL,ZESTRIL) 40 MG tablet; Take 1 tablet (40 mg total) by mouth daily.  Dispense: 30 tablet; Refill: 6 - amLODipine (NORVASC) 10 MG tablet; Take 1 tablet (10 mg total) by mouth daily.  Dispense: 30 tablet; Refill: 6 - CMP14+EGFR  3. Chronic diastolic heart failure (HCC) EF 65 to 70% from echo 01/2016 Euvolemic - furosemide (LASIX) 40 MG tablet; Take 1 tablet  (40 mg total) by mouth daily. As needed for pedal edema  Dispense: 30 tablet; Refill: 1  4. Current smoker Spent 3 minutes counseling on cessation and she is not ready to quit at this time  5. Polycythemia Previously referred to oncology however she never made the appointment - CBC with Differential/Platelet  6. Screening for viral disease - Hepatitis C antibody, reflex - HIV antibody (with reflex)  7. Screening for colon cancer Would love to refer for colonoscopy however she lacks the [facility] financial discount which she needs for referral - Fecal occult blood, imunochemical([facility]); Future   Meds ordered this encounter  Medications  . albuterol (VENTOLIN HFA) 108 (90 Base) MCG/ACT inhaler    Sig: Inhale 2 puffs into the lungs every 6 (six) hours as needed for wheezing or shortness of breath.    Dispense:  18 g    Refill:  3  . Fluticasone-Salmeterol (ADVAIR DISKUS) 100-50 MCG/DOSE AEPB    Sig: Inhale 1 puff into the lungs 2 (two) times daily.    Dispense:  60 each    Refill:  6  . furosemide (LASIX) 40 MG tablet    Sig: Take 1 tablet (40 mg total) by mouth daily. As needed for pedal edema    Dispense:  30 tablet    Refill:  1  . lisinopril (PRINIVIL,ZESTRIL) 40 MG tablet    Sig: Take 1 tablet (40 mg total) by mouth daily.    Dispense:  30 tablet    Refill:  6  . amLODipine (NORVASC) 10 MG tablet    Sig: Take 1 tablet (10 mg total) by mouth daily.    Dispense:  30 tablet    Refill:  6    Follow-up: Return in about 6 months (around 01/04/2019) for Follow-up of chronic medical conditions.   Charlott Rakes MD

## 2018-07-04 NOTE — BH Specialist Note (Signed)
Integrated Behavioral Health Initial Visit  MRN: [id number] Name: Shari Obrien  Number of Integrated Behavioral Health Clinician visits:: 1/6 Session Start time: 12:15 PM  Session End time: 12:45 PM Total time: 30 minutes  Type of Service: Integrated Behavioral Health- Individual/Family Interpretor:No. Interpretor Name and Language: NA   Warm Hand Off Completed.       SUBJECTIVE: Shari Obrien is a 62 y.o. female accompanied by self Patient was referred by Dr. Alvis LemmingsNewlin for depression, anxiety, and food insecurity. Patient reports the following symptoms/concerns: feelings of sadness and worry, difficulty sleeping, low energy, appetite changes, and irritability  Duration of problem: 2 weeks; Severity of problem: moderate  OBJECTIVE: Mood: Depressed and Affect: Appropriate Risk of harm to self or others: No plan to harm self or others  LIFE CONTEXT: Family and Social: Pt resides alone and has stable housing. She has a sister who resides locally that she speaks to on occasion School/Work: Pt recently lost job due to difficult work conditions that leadership were not willing to mitigate.  Self-Care: Pt smokes cigarettes (.5ppd) Denies additional substance use Life Changes: Pt has ongoing medical conditions and reports increased stress due to financial strain. She is currently unemployed and is experiencing food insecurity  GOALS ADDRESSED: Patient will: 1. Reduce symptoms of: anxiety, depression and stress 2. Increase knowledge and/or ability of: coping skills and healthy habits  3. Demonstrate ability to: Increase healthy adjustment to current life circumstances and Increase adequate support systems for patient/family  INTERVENTIONS: Interventions utilized: Solution-Focused Strategies, Supportive Counseling, Psychoeducation and/or Health Education and Link to [employer]  Standardized Assessments completed: GAD-7 and PHQ 2&9  ASSESSMENT: Patient currently  experiencing depression and anxiety triggered by ongoing medical conditions, financial strain, and limited support. She reports feelings of sadness and worry, difficulty sleeping, low energy, appetite changes, and irritability. Pt denies SI/HI/AVH.   Patient may benefit from psychoeducation and psychotherapy. LCSWA educated pt on the correlation between one's physical and mental health, in addition, to how stress can negatively impact both. Pt shared that she wants to better manage her medical conditions; however, she has limited resources to purchase medications and eat healthier. Pt is not willing to apply for financial counseling due to a particular documentation needed from the IRS.   LCSWA completed a referral to [facility] [employer] [facility] to assist pt with food insecurity. A food box was provided and pt was strongly encouraged to participate in the agency's Lunch and Learn program to improve diet and strengthen support system. Additional resources were provided for the [employer].   PLAN: 1. Follow up with behavioral health clinician on : Pt was encouraged to contact LCSWA if symptoms worsen or fail to improve to [facility]. 2. Behavioral recommendations: LCSWA recommends that pt apply healthy coping skills discussed and utilize provided resources. Pt is encouraged to schedule follow up appointment with LCSWA 3. Referral(s): Integrated Art gallery managerBehavioral Health Services (In Clinic) and [employer] Resources:  Academic librarianood and Finances 4. "From scale of 1-10, how likely are you to follow plan?":   Bridgett LarssonJasmine D Jaquis Picklesimer, LCSW 07/06/18 2:33 PM

## 2018-07-04 NOTE — Patient Instructions (Signed)
Steps to Quit Smoking Smoking tobacco can be bad for your health. It can also affect almost every organ in your body. Smoking puts you and people around you at risk for many serious long-lasting (chronic) diseases. Quitting smoking is hard, but it is one of the best things that you can do for your health. It is never too late to quit. What are the benefits of quitting smoking? When you quit smoking, you lower your risk for getting serious diseases and conditions. They can include:  Lung cancer or lung disease.  Heart disease.  Stroke.  Heart attack.  Not being able to have children (infertility).  Weak bones (osteoporosis) and broken bones (fractures).  If you have coughing, wheezing, and shortness of breath, those symptoms may get better when you quit. You may also get sick less often. If you are pregnant, quitting smoking can help to lower your chances of having a baby of low birth weight. What can I do to help me quit smoking? Talk with your doctor about what can help you quit smoking. Some things you can do (strategies) include:  Quitting smoking totally, instead of slowly cutting back how much you smoke over a period of time.  Going to in-person counseling. You are more likely to quit if you go to many counseling sessions.  Using resources and support systems, such as: ? Online chats with a counselor. ? Phone quitlines. ? Printed self-help materials. ? Support groups or group counseling. ? Text messaging programs. ? Mobile phone apps or applications.  Taking medicines. Some of these medicines may have nicotine in them. If you are pregnant or breastfeeding, do not take any medicines to quit smoking unless your doctor says it is okay. Talk with your doctor about counseling or other things that can help you.  Talk with your doctor about using more than one strategy at the same time, such as taking medicines while you are also going to in-person counseling. This can help make  quitting easier. What things can I do to make it easier to quit? Quitting smoking might feel very hard at first, but there is a lot that you can do to make it easier. Take these steps:  Talk to your family and friends. Ask them to support and encourage you.  Call phone quitlines, reach out to support groups, or work with a counselor.  Ask people who smoke to not smoke around you.  Avoid places that make you want (trigger) to smoke, such as: ? Bars. ? Parties. ? Smoke-break areas at work.  Spend time with people who do not smoke.  Lower the stress in your life. Stress can make you want to smoke. Try these things to help your stress: ? Getting regular exercise. ? Deep-breathing exercises. ? Yoga. ? Meditating. ? Doing a body scan. To do this, close your eyes, focus on one area of your body at a time from head to toe, and notice which parts of your body are tense. Try to relax the muscles in those areas.  Download or buy apps on your mobile phone or tablet that can help you stick to your quit plan. There are many free apps, such as QuitGuide from the CDC (Centers for Disease Control and Prevention). You can find more support from smokefree.gov and other websites.  This information is not intended to replace advice given to you by your health care provider. Make sure you discuss any questions you have with your health care provider. Document Released: 09/04/2009 Document   Revised: 07/06/2016 Document Reviewed: 03/25/2015 Elsevier Interactive Patient Education  2018 Elsevier Inc.  

## 2018-07-06 LAB — CMP14+EGFR
ALBUMIN: 4.3 g/dL (ref 3.6–4.8)
ALK PHOS: 73 IU/L (ref 39–117)
ALT: 18 IU/L (ref 0–32)
AST: 27 IU/L (ref 0–40)
Albumin/Globulin Ratio: 1.3 (ref 1.2–2.2)
BUN/Creatinine Ratio: 26 (ref 12–28)
BUN: 23 mg/dL (ref 8–27)
Bilirubin Total: 0.5 mg/dL (ref 0.0–1.2)
CO2: 19 mmol/L — ABNORMAL LOW (ref 20–29)
CREATININE: 0.88 mg/dL (ref 0.57–1.00)
Calcium: 9.3 mg/dL (ref 8.7–10.3)
Chloride: 103 mmol/L (ref 96–106)
GFR calc Af Amer: 81 mL/min/{1.73_m2} (ref >59)
GFR calc non Af Amer: 70 mL/min/{1.73_m2} (ref >59)
GLUCOSE: 92 mg/dL (ref 65–99)
Globulin, Total: 3.2 g/dL (ref 1.5–4.5)
Potassium: 5.3 mmol/L — ABNORMAL HIGH (ref 3.5–5.2)
Sodium: 145 mmol/L — ABNORMAL HIGH (ref 134–144)
Total Protein: 7.5 g/dL (ref 6.0–8.5)

## 2018-07-06 LAB — HIV ANTIBODY (ROUTINE TESTING W REFLEX): HIV Screen 4th Generation wRfx: NONREACTIVE

## 2018-07-06 LAB — CBC WITH DIFFERENTIAL/PLATELET

## 2018-07-10 ENCOUNTER — Other Ambulatory Visit: Payer: Self-pay

## 2018-07-25 ENCOUNTER — Telehealth: Payer: Self-pay

## 2018-07-25 NOTE — Telephone Encounter (Signed)
Patient was called and a voicemail was left informing patient that PCP needs her to come in for lab work.

## 2018-07-26 ENCOUNTER — Ambulatory Visit: Payer: Self-pay | Attending: Family Medicine

## 2018-07-26 DIAGNOSIS — D751 Secondary polycythemia: Secondary | ICD-10-CM | POA: Insufficient documentation

## 2018-07-26 NOTE — Progress Notes (Signed)
Patient here for lab visit only 

## 2018-07-28 ENCOUNTER — Encounter (HOSPITAL_COMMUNITY): Payer: Self-pay | Admitting: Emergency Medicine

## 2018-07-28 ENCOUNTER — Emergency Department (HOSPITAL_COMMUNITY)
Admission: EM | Admit: 2018-07-28 | Discharge: 2018-07-28 | Disposition: A | Discharge status: Home / Self Care | Payer: Self-pay | Attending: Emergency Medicine | Admitting: Emergency Medicine

## 2018-07-28 ENCOUNTER — Emergency Department (HOSPITAL_COMMUNITY): Payer: Self-pay

## 2018-07-28 ENCOUNTER — Emergency Department (HOSPITAL_BASED_OUTPATIENT_CLINIC_OR_DEPARTMENT_OTHER): Payer: Self-pay

## 2018-07-28 DIAGNOSIS — F17210 Nicotine dependence, cigarettes, uncomplicated: Secondary | ICD-10-CM | POA: Insufficient documentation

## 2018-07-28 DIAGNOSIS — W19XXXA Unspecified fall, initial encounter: Secondary | ICD-10-CM

## 2018-07-28 DIAGNOSIS — M7989 Other specified soft tissue disorders: Secondary | ICD-10-CM

## 2018-07-28 DIAGNOSIS — Z79899 Other long term (current) drug therapy: Secondary | ICD-10-CM | POA: Insufficient documentation

## 2018-07-28 DIAGNOSIS — M79605 Pain in left leg: Secondary | ICD-10-CM | POA: Insufficient documentation

## 2018-07-28 DIAGNOSIS — I110 Hypertensive heart disease with heart failure: Secondary | ICD-10-CM | POA: Insufficient documentation

## 2018-07-28 DIAGNOSIS — J449 Chronic obstructive pulmonary disease, unspecified: Secondary | ICD-10-CM | POA: Insufficient documentation

## 2018-07-28 DIAGNOSIS — I5030 Unspecified diastolic (congestive) heart failure: Secondary | ICD-10-CM | POA: Insufficient documentation

## 2018-07-28 LAB — SPECIMEN STATUS REPORT

## 2018-07-28 LAB — D-DIMER, QUANTITATIVE (NOT AT ARMC): D DIMER QUANT: 1.91 ug{FEU}/mL — AB (ref 0.00–0.50)

## 2018-07-28 MED ORDER — METHOCARBAMOL 500 MG PO TABS: 500.0000 mg | ORAL_TABLET | Freq: Two times a day (BID) | ORAL | 0 refills | Status: DC

## 2018-07-28 NOTE — ED Provider Notes (Signed)
  Physical Exam  BP (!) 169/84   Pulse 88   Temp 99.3 F (37.4 C) (Oral)   Resp 16   Ht 5\' 6"  (1.676 m)   SpO2 92%   BMI 37.22 kg/m   Physical Exam  Constitutional: She appears well-developed and well-nourished.  Musculoskeletal:       Left hip: She exhibits tenderness. She exhibits normal range of motion and normal strength.       Left knee: Normal.       Left ankle: She exhibits decreased range of motion and swelling. Tenderness. Achilles tendon normal.       Left upper leg: She exhibits tenderness.       Left foot: There is decreased range of motion, tenderness and swelling. There is no bony tenderness.  Feet:  Left Foot:  Skin Integrity: Negative for erythema or warmth.  Neurological: She has normal strength. No sensory deficit. She exhibits normal muscle tone.  Reflex Scores:      Patellar reflexes are 1+ on the right side and 1+ on the left side.      Achilles reflexes are 1+ on the right side and 1+ on the left side. Nursing note and vitals reviewed.   ED Course/Procedures   Shari Obrien is a 62 yo female with an extensive medical history who presented for left lower extremity x1 week following a mechanical fall at work. Patient has been ambulatory on her since the incident. She has exquisite tenderness to light palpation over the left foot ankle with associated swelling and decreased ROM. There is no erythema or warmth to suggest an infectious etiology. X-rays were ordered to evaluate for acute bony abnormalities along with a d-dimer to evaluate for DVT given pt's medical history and unilateral leg swelling and pain by previous provider, Lars Pinks, PA-C.  Clinical Course as of Jul 29 2027  Fri Jul 28, 2018  1706 Case discussed with Lars Pinks, PA-C. X-rays ordered to evaluate for acute fractures or dislocations. D-dimer also ordered to evaluate for possible DVT. Plan if all results are normal are for discharge with MSK treatment. Treatment decision deferred to this  provider if tests come back abnormal.   [GM]  1932 Vascular tech informed this provider that U/S negative for DVT.   [GM]    Clinical Course User Index [GM] Windy Carina, PA-C    Procedures  MDM  1. Left Lower Extremity Pain. Education provided on OTC and supportive treatment for pain relief. Air cast applied in the ED for support.  Dispo: Home. After thorough clinical evaluation, this patient is determined to be medically stable and can be safely discharged with the previously mentioned treatment and/or outpatient follow-up/referral(s). At this time, there are no other apparent medical conditions that require further screening, evaluation or treatment.       Reva Bores 07/28/18 2028    Maia Plan, MD 07/29/18 [phone]

## 2018-07-28 NOTE — ED Notes (Signed)
Ultrasound at bedside

## 2018-07-28 NOTE — Progress Notes (Signed)
Left lower extremity venous duplex has been completed. Negative for DVT.  07/28/18 7:38 PM Olen Cordial RVT
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
## 2018-07-28 NOTE — ED Notes (Signed)
See EDP assessment 

## 2018-07-28 NOTE — ED Notes (Signed)
Pt verbalizes understanding of d/c instructions. Pt given crutches. Pt taken to lobby in wheelchair at d/c with all belongings.

## 2018-07-28 NOTE — ED Triage Notes (Signed)
Pt presents with pain continued after fall at work on Friday last week; pt states she was in the bathroom and slipped in some water; pt attempted home remedies of epsom salt baths, ice, heat pads with no improvement; pt states her pain is in the lower back, L groin radiates down to the L foot; pain with weight bearing; attempted to go back to work Wednesday but could not

## 2018-07-28 NOTE — Discharge Instructions (Addendum)
Your x-rays were normal today. No broken or dislocated bones. The ultrasound that looked for blood clot was also normal so there is no blood clot. As we discussed, your pain and swelling is coming from your fall last week. I recommend that you take an anti-inflammatory of your choosing 3-4 times a day for the next few days to help with the swelling and pain. You can still do the Epsom soaks. When you are resting, keep your leg elevated and apply ice in 15-20 minute intervals.  Please follow-up with your PCP in 2-4 weeks for re-evaluation of your symptoms.    Thank you for allowing [place] to take care of you today.

## 2018-07-31 MED FILL — METHOCARBAMOL 500 MG TABS: 500 | 7 days supply | Qty: 14 | Fill #0

## 2018-08-01 MED FILL — AMLODIPINE BESYLATE 10 MG T: 10 | 30 days supply | Qty: 30 | Fill #3

## 2018-08-04 ENCOUNTER — Other Ambulatory Visit: Payer: Self-pay | Admitting: Family Medicine

## 2018-08-04 ENCOUNTER — Telehealth: Payer: Self-pay

## 2018-08-04 DIAGNOSIS — D751 Secondary polycythemia: Secondary | ICD-10-CM

## 2018-08-04 NOTE — Telephone Encounter (Signed)
-----   Message from Hoy RegisterEnobong Newlin, MD sent at 08/04/2018  2:21 PM EDT ----- Labs reveal significantly elevated red blood cells.  I have referred her to hematology

## 2018-08-04 NOTE — Telephone Encounter (Signed)
Patient was called and informed to contact office for lab results.    If patient returns phone call please inform patient of lab results below. 

## 2018-08-07 ENCOUNTER — Telehealth: Payer: Self-pay | Admitting: Family Medicine

## 2018-08-07 NOTE — Telephone Encounter (Signed)
Patient called back regarding lab results. Please follow up with patient.

## 2018-08-08 NOTE — Telephone Encounter (Signed)
Call was placed to patient and a voicemail was left informing patient to contact office for lab results.

## 2018-08-15 LAB — SPECIMEN STATUS REPORT

## 2018-08-16 ENCOUNTER — Encounter: Payer: Self-pay | Admitting: Hematology and Oncology

## 2018-08-16 ENCOUNTER — Telehealth: Payer: Self-pay | Admitting: Hematology and Oncology

## 2018-08-16 NOTE — Telephone Encounter (Signed)
New referral received from Dr.Newlin for polycythemia. Pt has been scheduled to see Dr. Pamelia Hoit on 10/17 at 345pm. Letter mailed to the pt.

## 2018-08-21 LAB — CBC WITH DIFFERENTIAL/PLATELET
BASOS: 1 %
Basophils Absolute: 0.1 10*3/uL (ref 0.0–0.2)
EOS (ABSOLUTE): 0.3 10*3/uL (ref 0.0–0.4)
EOS: 4 %
Hematocrit: 59.3 % — ABNORMAL HIGH (ref 34.0–46.6)
Hemoglobin: 19.4 g/dL — ABNORMAL HIGH (ref 11.1–15.9)
IMMATURE GRANS (ABS): 0 10*3/uL (ref 0.0–0.1)
IMMATURE GRANULOCYTES: 0 %
LYMPHS: 37 %
Lymphocytes Absolute: 2.3 10*3/uL (ref 0.7–3.1)
MCH: 29.9 pg (ref 26.6–33.0)
MCHC: 32.7 g/dL (ref 31.5–35.7)
MCV: 91 fL (ref 79–97)
Monocytes Absolute: 0.7 10*3/uL (ref 0.1–0.9)
Monocytes: 12 %
NEUTROS PCT: 46 %
Neutrophils Absolute: 2.8 10*3/uL (ref 1.4–7.0)
Platelets: 171 10*3/uL (ref 150–450)
RBC: 6.49 x10E6/uL — ABNORMAL HIGH (ref 3.77–5.28)
RDW: 15.3 % (ref 12.3–15.4)
WBC: 6.2 10*3/uL (ref 3.4–10.8)

## 2018-08-21 LAB — SPECIMEN STATUS REPORT

## 2018-08-30 MED FILL — LISINOPRIL 40 MG TABLET: 40 | 30 days supply | Qty: 30 | Fill #1

## 2018-08-30 MED FILL — AMLODIPINE BESYLATE 10 MG T: 10 | 30 days supply | Qty: 30 | Fill #0

## 2018-08-30 MED FILL — FUROSEMIDE 40 MG TAB: 40 | 30 days supply | Qty: 30 | Fill #1

## 2018-09-04 MED FILL — $VENTOLIN HFA 18G INHALER: 108 (90 BAS | 75 days supply | Qty: 54 | Fill #0

## 2018-09-04 MED FILL — $ADVAIR 100/50 MCG INHALER: 100-50 | 90 days supply | Qty: 180 | Fill #0

## 2018-09-07 ENCOUNTER — Inpatient Hospital Stay: Payer: Self-pay | Admitting: Hematology and Oncology

## 2018-09-07 NOTE — Assessment & Plan Note (Deleted)
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  In order to rule out primary polycythemia, I will send for Jak 2 mutation testing.  We will also send for erythropoietin.  Indications for phlebotomy for secondary polycythemia is if the patient were to have symptoms related to hypercoagulability/stroke/CAD/DVT or PE.  Treatment of underlying cause is the management for secondary polycythemia. I would like the patient to come back in 2 weeks to review the results of blood work.

## 2018-09-14 ENCOUNTER — Other Ambulatory Visit: Payer: Self-pay | Admitting: Hematology and Oncology

## 2018-09-14 DIAGNOSIS — R0600 Dyspnea, unspecified: Secondary | ICD-10-CM

## 2018-09-14 DIAGNOSIS — R0689 Other abnormalities of breathing: Secondary | ICD-10-CM

## 2018-09-14 DIAGNOSIS — D751 Secondary polycythemia: Secondary | ICD-10-CM

## 2018-09-14 DIAGNOSIS — J439 Emphysema, unspecified: Secondary | ICD-10-CM

## 2018-09-14 NOTE — Progress Notes (Deleted)
[facility] Outpatient Hematology/Oncology Initial Consultation  Patient Name:  Shari Obrien  DOB: 1956/10/25   Date of Service: September 14, 2018  Referring Provider: Hoy Register, MD [address] [place], [place] [address]   Consulting Physician: Toni Arthurs, MD Hematology/Oncology  Reason for Referral: In the setting of erythrocytosis of unclear etiology, she presents now for further diagnostic and therapeutic recommendations.  History Present Illness: Shari Obrien is a 62 year old resident of [place] whose past medical history significant for coronary artery disease, chronic obstructive pulmonary disease; primary hypertension; and compensated congestive heart care.    Past Medical History:  Diagnosis Date  . Aortic atherosclerosis (HCC)   . CHF (congestive heart failure) (HCC)   . COPD (chronic obstructive pulmonary disease) (HCC) Dx 2013  . HTN (hypertension) 08/30/2013  . Hypertension   . Lower leg edema 06/2016    Past Surgical History:  Procedure Laterality Date  . ABDOMINAL HYSTERECTOMY    . BACK SURGERY     Gynecologic History:    Family History  Problem Relation Age of Onset  . Hypertension Maternal Grandmother     Social History: Social History   Socioeconomic History  . Marital status: Legally Separated    Spouse name: Not on file  . Number of children: Not on file  . Years of education: Not on file  . Highest education level: Not on file  Occupational History  . Not on file  Social Needs  . Financial resource strain: Not on file  . Food insecurity:    Worry: Not on file    Inability: Not on file  . Transportation needs:    Medical: Not on file    Non-medical: Not on file  Tobacco Use  . Smoking status: Current Every Day Smoker    Packs/day: 1.00    Years: 38.00    Pack years: 38.00    Types: Cigarettes  . Smokeless tobacco: Never Used  Substance and Sexual Activity  . Alcohol use: No  . Drug use:  No  . Sexual activity: Not on file  Lifestyle  . Physical activity:    Days per week: Not on file    Minutes per session: Not on file  . Stress: Not on file  Relationships  . Social connections:    Talks on phone: Not on file    Gets together: Not on file    Attends religious service: Not on file    Active member of club or organization: Not on file    Attends meetings of clubs or organizations: Not on file    Relationship status: Not on file  . Intimate partner violence:    Fear of current or ex partner: Not on file    Emotionally abused: Not on file    Physically abused: Not on file    Forced sexual activity: Not on file  Other Topics Concern  . Not on file  Social History Narrative   Lives alone in her apartment in [place]. Works as a Writer at [employer].    Transfusion History: No prior transfusion  Exposure History:   Allergies:  Allergies  Allergen Reactions  . Periactin [Cyproheptadine] Swelling    Current Medications: Current Outpatient Medications on File Prior to Visit  Medication Sig  . albuterol (VENTOLIN HFA) 108 (90 Base) MCG/ACT inhaler Inhale 2 puffs into the lungs every 6 (six) hours as needed for wheezing or shortness of breath.  Marland Kitchen amLODipine (NORVASC) 10 MG tablet Take 1 tablet (10 mg  total) by mouth daily.  . cetirizine (ZYRTEC) 10 MG tablet Take 10 mg by mouth daily as needed for allergies.   Marland Kitchen diclofenac sodium (VOLTAREN) 1 % GEL Apply 2 g topically 4 (four) times daily.  . Fluticasone-Salmeterol (ADVAIR DISKUS) 100-50 MCG/DOSE AEPB Inhale 1 puff into the lungs 2 (two) times daily.  . furosemide (LASIX) 40 MG tablet Take 1 tablet (40 mg total) by mouth daily. As needed for pedal edema  . lisinopril (PRINIVIL,ZESTRIL) 40 MG tablet Take 1 tablet (40 mg total) by mouth daily.  Marland Kitchen triamcinolone (KENALOG) 0.025 % ointment APPLY 1 APPLICATION TOPICALLY 2 TIMES DAILY. (Patient not taking: Reported on 08/15/2017)   No current facility-administered  medications on file prior to visit.      Review of Systems: Constitutional: No fever, sweats, or shaking chills.  No appetite or weight deficit. Skin: No rash, scaling, sores, lumps, or jaundice. HEENT: No visual changes or hearing deficit. Pulmonary: No unusual cough, sore throat, or orthopnea. Breasts: No complaints. Cardiovascular: No coronary artery disease, angina, or myocardial infarction.  No cardiac dysrhythmia, essential hypertension, or dyslipidemia. Gastrointestinal: No indigestion, dysphagia, abdominal pain, diarrhea, or constipation.  No change in bowel habits. Genitourinary: No frequency, urgency, hematuria, or dysuria. Musculoskeletal: No arthralgias or myalgias; no joint swelling, pain, or instability. Hematologic: No bleeding tendency or easy bruisability. Endocrine: No intolerance to heat or cold; no thyroid disease or diabetes mellitus. Vascular: No peripheral arterial or venous thromboembolic disease. Psychological: No anxiety, depression, or mood changes; no mental health illnesses. Neurological: No dizziness, lightheadedness, syncope, or near syncopal episodes; no numbness or tingling in the fingers or toes.  Physical Examination: Vital Signs: There is no height or weight on file to calculate BSA. There were no vitals filed for this visit. There were no vitals filed for this visit. ECOG PERFORMANCE STATUS: {CHL ONC ECOG JY:[id number]} Constitutional:  Shari Obrien is fully nourished and developed.  He/She looks age appropriate.  He/She is friendly and cooperative without respiratory compromise at rest. Skin: No rashes, scaling, dryness, jaundice, or itching. HEENT: Head is normocephalic and atraumatic.  Pupils are equal round and reactive to light and accommodation.  Sclerae are anicteric.  Conjunctivae are pink.  No sinus tenderness nor oropharyngeal lesions.  Lips without cracking or peeling; tongue without mass, inflammation, or nodularity.  Mucous membranes are  moist. Neck: Supple and symmetric.  No jugular venous distention or thyromegaly.  Trachea is midline. Lymphatics: No cervical or supraclavicular lymphadenopathy.  No epitrochlear, axillary, or inguinal lymphadenopathy is appreciated. Breasts: No mass, discharge, dimpling, or retraction. Respiratory/chest: Thorax is symmetrical.  Breath sounds are clear to auscultation and percussion.  Normal excursion and respiratory effort. Back: Symmetric without deformity or tenderness. Cardiovascular: Heart rate and rhythm are regular without murmurs, gallops, or rubs. Gastrointestinal: Abdomen is soft, nontender; no organomegaly.  Bowel sounds are normoactive.  No masses are appreciated. Genitourinary: Normal external female/female genitalia. Rectal examination: Not performed. Extremities: In the lower extremities, there is no asymmetric swelling, erythema, tenderness, or cord formation.  No clubbing, cyanosis, nor edema. Hematologic: No petechiae, hematomas, or ecchymoses. Psychological:  He/She is oriented to person, place, and time; normal affect, memory, and cognition. Neurological: There are no gross neurologic deficits.  Laboratory Results: I have reviewed the data as listed: CBC Latest Ref Rng & Units 07/26/2018 07/04/2018 07/08/2017  WBC 3.4 - 10.8 x10E3/uL 6.2 CANCELED 4.2  Hemoglobin 11.1 - 15.9 g/dL 19.4(H) CANCELED 19.9(H)  Hematocrit 34.0 - 46.6 % 59.3(H) CANCELED 61.6(H)  Platelets 150 -  450 x10E3/uL 171 CANCELED 169    CMP Latest Ref Rng & Units 07/04/2018 07/08/2017 07/02/2016  Glucose 65 - 99 mg/dL 92 90 97  BUN 8 - 27 mg/dL 23 16 15   Creatinine 0.57 - 1.00 mg/dL 1.61 0.96 0.45  Sodium 134 - 144 mmol/L 145(H) 145(H) 140  Potassium 3.5 - 5.2 mmol/L 5.3(H) 4.4 4.4  Chloride 96 - 106 mmol/L 103 102 101  CO2 20 - 29 mmol/L 19(L) 25 30  Calcium 8.7 - 10.3 mg/dL 9.3 4.0(J) 8.1(X)  Total Protein 6.0 - 8.5 g/dL 7.5 6.5 -  Total Bilirubin 0.0 - 1.2 mg/dL 0.5 0.7 -  Alkaline Phos 39 - 117 IU/L  73 63 -  AST 0 - 40 IU/L 27 21 -  ALT 0 - 32 IU/L 18 15 -     Diagnostic/Imaging Studies:   Summary/Assessment:   Recommendation/Plan:   The total time spent discussing the XXX, methodology for evaluating XXX,  preliminary considerations with recommendations and plan was 60 minutes.  At least 50% of that time was spent in discussion, reviewing outside records, laboratory evaluation, counseling, and answering questions. All questions were answered to her satisfaction. she knows to call the Clinic with any problems, questions, or concerns.  This note was dictated using voice activated technology/software.  Unfortunately, typographical errors are not uncommon, and transcription is subject to mistakes and regrettably misinterpretation.  If necessary, clarification of the above information can be discussed with me at any time.  Thank you Dr. Clayborne Artist for allowing my participation in the care of Shari Obrien. I will keep you closely informed as the results of her preliminary laboratory data become available.  Please do not hesitate to call should any questions arise regarding this initial consultation and discussion.  FOLLOW UP: AS DIRECTED   cc:   Toni Arthurs, MD  Hematology/Oncology [facility] [address] [place], [place] [address] Office: [phone] Main: [phone]

## 2018-09-15 ENCOUNTER — Inpatient Hospital Stay: Payer: Self-pay | Attending: Hematology and Oncology | Admitting: Hematology and Oncology

## 2018-09-15 ENCOUNTER — Telehealth: Payer: Self-pay | Admitting: Emergency Medicine

## 2018-09-15 NOTE — Telephone Encounter (Signed)
Called pt about missed appt for 10/25, no answer.  Left VM asking her to call back to reschedule.  MD Caron Presume aware.

## 2018-10-03 ENCOUNTER — Inpatient Hospital Stay: Payer: Self-pay | Attending: Hematology and Oncology | Admitting: Hematology and Oncology

## 2018-10-04 ENCOUNTER — Other Ambulatory Visit: Payer: Self-pay | Admitting: Family Medicine

## 2018-10-04 DIAGNOSIS — I10 Essential (primary) hypertension: Secondary | ICD-10-CM

## 2018-10-04 DIAGNOSIS — I5032 Chronic diastolic (congestive) heart failure: Secondary | ICD-10-CM

## 2018-10-04 MED FILL — LISINOPRIL 40 MG TABLET: 40 | 30 days supply | Qty: 30 | Fill #2

## 2018-10-04 MED FILL — AMLODIPINE BESYLATE 10 MG T: 10 | 30 days supply | Qty: 30 | Fill #1

## 2018-10-05 MED FILL — FUROSEMIDE 40 MG TAB: 40 | 30 days supply | Qty: 30 | Fill #0

## 2018-11-02 MED FILL — LISINOPRIL 40 MG TABLET: 40 | 30 days supply | Qty: 30 | Fill #3

## 2018-11-02 MED FILL — FUROSEMIDE 40 MG TAB: 40 | 30 days supply | Qty: 30 | Fill #1

## 2018-11-02 MED FILL — AMLODIPINE BESYLATE 10 MG T: 10 | 30 days supply | Qty: 30 | Fill #2

## 2018-12-04 ENCOUNTER — Other Ambulatory Visit: Payer: Self-pay | Admitting: Family Medicine

## 2018-12-04 DIAGNOSIS — I5032 Chronic diastolic (congestive) heart failure: Secondary | ICD-10-CM

## 2018-12-04 DIAGNOSIS — I10 Essential (primary) hypertension: Secondary | ICD-10-CM

## 2018-12-04 MED FILL — AMLODIPINE BESYLATE 10 MG T: 10 | 30 days supply | Qty: 30 | Fill #3

## 2018-12-04 MED FILL — LISINOPRIL 40 MG TABLET: 40 | 30 days supply | Qty: 30 | Fill #4

## 2018-12-06 MED FILL — FUROSEMIDE 40 MG TAB: 40 | 30 days supply | Qty: 30 | Fill #0

## 2019-01-04 ENCOUNTER — Encounter (HOSPITAL_COMMUNITY): Payer: Self-pay | Admitting: Emergency Medicine

## 2019-01-04 ENCOUNTER — Emergency Department (HOSPITAL_COMMUNITY): Payer: Self-pay

## 2019-01-04 ENCOUNTER — Emergency Department (HOSPITAL_COMMUNITY)
Admission: EM | Admit: 2019-01-04 | Discharge: 2019-01-04 | Disposition: A | Discharge status: Home / Self Care | Payer: Self-pay | Attending: Emergency Medicine | Admitting: Emergency Medicine

## 2019-01-04 DIAGNOSIS — Z79899 Other long term (current) drug therapy: Secondary | ICD-10-CM | POA: Insufficient documentation

## 2019-01-04 DIAGNOSIS — J449 Chronic obstructive pulmonary disease, unspecified: Secondary | ICD-10-CM | POA: Insufficient documentation

## 2019-01-04 DIAGNOSIS — X58XXXA Exposure to other specified factors, initial encounter: Secondary | ICD-10-CM | POA: Insufficient documentation

## 2019-01-04 DIAGNOSIS — I110 Hypertensive heart disease with heart failure: Secondary | ICD-10-CM | POA: Insufficient documentation

## 2019-01-04 DIAGNOSIS — F17210 Nicotine dependence, cigarettes, uncomplicated: Secondary | ICD-10-CM | POA: Insufficient documentation

## 2019-01-04 DIAGNOSIS — Y9384 Activity, sleeping: Secondary | ICD-10-CM | POA: Insufficient documentation

## 2019-01-04 DIAGNOSIS — S46012A Strain of muscle(s) and tendon(s) of the rotator cuff of left shoulder, initial encounter: Secondary | ICD-10-CM | POA: Insufficient documentation

## 2019-01-04 DIAGNOSIS — S46912A Strain of unspecified muscle, fascia and tendon at shoulder and upper arm level, left arm, initial encounter: Secondary | ICD-10-CM

## 2019-01-04 DIAGNOSIS — Y92003 Bedroom of unspecified non-institutional (private) residence as the place of occurrence of the external cause: Secondary | ICD-10-CM | POA: Insufficient documentation

## 2019-01-04 DIAGNOSIS — I5032 Chronic diastolic (congestive) heart failure: Secondary | ICD-10-CM | POA: Insufficient documentation

## 2019-01-04 DIAGNOSIS — Y999 Unspecified external cause status: Secondary | ICD-10-CM | POA: Insufficient documentation

## 2019-01-04 MED ORDER — METHOCARBAMOL 500 MG PO TABS
500.0000 mg | ORAL_TABLET | Freq: Once | ORAL | Status: AC
  Administered 2019-01-04: 500 mg via ORAL
  Filled 2019-01-04: qty 1

## 2019-01-04 MED ORDER — METHOCARBAMOL 500 MG PO TABS: 500.0000 mg | ORAL_TABLET | Freq: Three times a day (TID) | ORAL | 0 refills | Status: DC | PRN

## 2019-01-04 MED ORDER — KETOROLAC TROMETHAMINE 60 MG/2ML IM SOLN
60.0000 mg | Freq: Once | INTRAMUSCULAR | Status: AC
  Administered 2019-01-04: 60 mg via INTRAMUSCULAR
  Filled 2019-01-04: qty 2

## 2019-01-04 MED FILL — METHOCARBAMOL 500 MG TABS: 500 | 3 days supply | Qty: 8 | Fill #0

## 2019-01-04 NOTE — ED Notes (Signed)
Patient transported to X-ray 
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
## 2019-01-04 NOTE — ED Notes (Signed)
Pt back from X-ray.  

## 2019-01-04 NOTE — ED Notes (Addendum)
Pt stated that her pain was worse with the sling on. EDP, Pickering, notified and stated that pt does not have to wear the sling if pain is worse while wearing the sling.

## 2019-01-04 NOTE — ED Notes (Signed)
Pt complaining about sling being more painful and ripped sling off.

## 2019-01-04 NOTE — ED Triage Notes (Signed)
Pt reports left shoulder pian x 2 weeks but states it got worse last night.  Pt denies any injury.

## 2019-01-10 ENCOUNTER — Other Ambulatory Visit: Payer: Self-pay | Admitting: Family Medicine

## 2019-01-10 DIAGNOSIS — I5032 Chronic diastolic (congestive) heart failure: Secondary | ICD-10-CM

## 2019-01-10 DIAGNOSIS — I10 Essential (primary) hypertension: Secondary | ICD-10-CM

## 2019-01-10 MED FILL — AMLODIPINE BESYLATE 10 MG T: 10 | 30 days supply | Qty: 30 | Fill #4

## 2019-01-10 MED FILL — LISINOPRIL 40 MG TABLET: 40 | 30 days supply | Qty: 30 | Fill #5

## 2019-01-12 MED FILL — FUROSEMIDE 40 MG TAB: 40 | 30 days supply | Qty: 30 | Fill #0

## 2019-02-13 ENCOUNTER — Other Ambulatory Visit: Payer: Self-pay | Admitting: Family Medicine

## 2019-02-13 DIAGNOSIS — I5032 Chronic diastolic (congestive) heart failure: Secondary | ICD-10-CM

## 2019-02-13 DIAGNOSIS — I10 Essential (primary) hypertension: Secondary | ICD-10-CM

## 2019-02-13 MED FILL — LISINOPRIL 40 MG TABLET: 40 | 30 days supply | Qty: 30 | Fill #6

## 2019-02-13 MED FILL — $VENTOLIN HFA 18G INHALER: 108 (90 BAS | 24 days supply | Qty: 18 | Fill #1

## 2019-02-13 MED FILL — $ADVAIR 100/50 MCG INHALER: 100-50 | 90 days supply | Qty: 180 | Fill #1

## 2019-02-13 MED FILL — AMLODIPINE BESYLATE 10 MG T: 10 | 30 days supply | Qty: 30 | Fill #5

## 2019-02-27 ENCOUNTER — Emergency Department (HOSPITAL_COMMUNITY): Payer: Self-pay

## 2019-02-27 ENCOUNTER — Inpatient Hospital Stay (HOSPITAL_COMMUNITY)
Admission: EM | Admit: 2019-02-27 | Discharge: 2019-03-02 | DRG: 291 | Disposition: A | Discharge status: Home / Self Care | Payer: Self-pay | Attending: Internal Medicine | Admitting: Internal Medicine

## 2019-02-27 ENCOUNTER — Inpatient Hospital Stay (HOSPITAL_COMMUNITY): Payer: Self-pay

## 2019-02-27 ENCOUNTER — Encounter (HOSPITAL_COMMUNITY): Payer: Self-pay | Admitting: Emergency Medicine

## 2019-02-27 ENCOUNTER — Other Ambulatory Visit: Payer: Self-pay

## 2019-02-27 DIAGNOSIS — I10 Essential (primary) hypertension: Secondary | ICD-10-CM | POA: Diagnosis present

## 2019-02-27 DIAGNOSIS — J438 Other emphysema: Secondary | ICD-10-CM

## 2019-02-27 DIAGNOSIS — I110 Hypertensive heart disease with heart failure: Principal | ICD-10-CM | POA: Diagnosis present

## 2019-02-27 DIAGNOSIS — J9601 Acute respiratory failure with hypoxia: Secondary | ICD-10-CM

## 2019-02-27 DIAGNOSIS — R0902 Hypoxemia: Secondary | ICD-10-CM

## 2019-02-27 DIAGNOSIS — I5030 Unspecified diastolic (congestive) heart failure: Secondary | ICD-10-CM | POA: Diagnosis present

## 2019-02-27 DIAGNOSIS — Z8249 Family history of ischemic heart disease and other diseases of the circulatory system: Secondary | ICD-10-CM

## 2019-02-27 DIAGNOSIS — M7989 Other specified soft tissue disorders: Secondary | ICD-10-CM | POA: Diagnosis present

## 2019-02-27 DIAGNOSIS — I5033 Acute on chronic diastolic (congestive) heart failure: Secondary | ICD-10-CM

## 2019-02-27 DIAGNOSIS — I5032 Chronic diastolic (congestive) heart failure: Secondary | ICD-10-CM

## 2019-02-27 DIAGNOSIS — J441 Chronic obstructive pulmonary disease with (acute) exacerbation: Secondary | ICD-10-CM | POA: Diagnosis present

## 2019-02-27 DIAGNOSIS — F17200 Nicotine dependence, unspecified, uncomplicated: Secondary | ICD-10-CM | POA: Diagnosis present

## 2019-02-27 DIAGNOSIS — D751 Secondary polycythemia: Secondary | ICD-10-CM | POA: Diagnosis present

## 2019-02-27 DIAGNOSIS — R600 Localized edema: Secondary | ICD-10-CM | POA: Diagnosis present

## 2019-02-27 DIAGNOSIS — Z7951 Long term (current) use of inhaled steroids: Secondary | ICD-10-CM

## 2019-02-27 DIAGNOSIS — J9691 Respiratory failure, unspecified with hypoxia: Secondary | ICD-10-CM | POA: Diagnosis present

## 2019-02-27 DIAGNOSIS — Z23 Encounter for immunization: Secondary | ICD-10-CM

## 2019-02-27 DIAGNOSIS — F17210 Nicotine dependence, cigarettes, uncomplicated: Secondary | ICD-10-CM | POA: Diagnosis present

## 2019-02-27 LAB — CBC WITH DIFFERENTIAL/PLATELET
Abs Immature Granulocytes: 0.03 10*3/uL (ref 0.00–0.07)
Basophils Absolute: 0.1 10*3/uL (ref 0.0–0.1)
Basophils Relative: 1 %
Eosinophils Absolute: 0.2 10*3/uL (ref 0.0–0.5)
Eosinophils Relative: 3 %
HCT: 59.3 % — ABNORMAL HIGH (ref 36.0–46.0)
Hemoglobin: 18.8 g/dL — ABNORMAL HIGH (ref 12.0–15.0)
Immature Granulocytes: 1 %
Lymphocytes Relative: 38 %
Lymphs Abs: 2.4 10*3/uL (ref 0.7–4.0)
MCH: 28.9 pg (ref 26.0–34.0)
MCHC: 31.7 g/dL (ref 30.0–36.0)
MCV: 91.2 fL (ref 80.0–100.0)
Monocytes Absolute: 0.8 10*3/uL (ref 0.1–1.0)
Monocytes Relative: 12 %
Neutro Abs: 2.9 10*3/uL (ref 1.7–7.7)
Neutrophils Relative %: 45 %
Platelets: 216 10*3/uL (ref 150–400)
RBC: 6.5 MIL/uL — ABNORMAL HIGH (ref 3.87–5.11)
RDW: 18.1 % — ABNORMAL HIGH (ref 11.5–15.5)
WBC: 6.5 10*3/uL (ref 4.0–10.5)
nRBC: 0 % (ref 0.0–0.2)

## 2019-02-27 LAB — BASIC METABOLIC PANEL
Anion gap: 12 (ref 5–15)
BUN: 16 mg/dL (ref 8–23)
CO2: 24 mmol/L (ref 22–32)
Calcium: 8.8 mg/dL — ABNORMAL LOW (ref 8.9–10.3)
Chloride: 105 mmol/L (ref 98–111)
Creatinine, Ser: 0.86 mg/dL (ref 0.44–1.00)
GFR calc Af Amer: >60 mL/min (ref >60)
GFR calc non Af Amer: >60 mL/min (ref >60)
Glucose, Bld: 108 mg/dL — ABNORMAL HIGH (ref 70–99)
Potassium: 3.6 mmol/L (ref 3.5–5.1)
Sodium: 141 mmol/L (ref 135–145)

## 2019-02-27 LAB — BRAIN NATRIURETIC PEPTIDE: B Natriuretic Peptide: 142.3 pg/mL — ABNORMAL HIGH (ref 0.0–100.0)

## 2019-02-27 LAB — MRSA PCR SCREENING: MRSA by PCR: NEGATIVE

## 2019-02-27 MED ORDER — PREDNISONE 20 MG PO TABS: 40.0000 mg | ORAL_TABLET | Freq: Every day | ORAL | Status: DC

## 2019-02-27 MED ORDER — IPRATROPIUM-ALBUTEROL 0.5-2.5 (3) MG/3ML IN SOLN
3.0000 mL | Freq: Once | RESPIRATORY_TRACT | Status: AC
  Administered 2019-02-27: 08:00:00 3 mL via RESPIRATORY_TRACT
  Filled 2019-02-27: qty 3

## 2019-02-27 MED ORDER — IPRATROPIUM-ALBUTEROL 0.5-2.5 (3) MG/3ML IN SOLN
3.0000 mL | Freq: Four times a day (QID) | RESPIRATORY_TRACT | Status: DC
  Administered 2019-02-27 – 2019-03-02 (×12): 3 mL via RESPIRATORY_TRACT
  Filled 2019-02-27 (×13): qty 3

## 2019-02-27 MED ORDER — MOMETASONE FURO-FORMOTEROL FUM 100-5 MCG/ACT IN AERO
2.0000 | INHALATION_SPRAY | Freq: Two times a day (BID) | RESPIRATORY_TRACT | Status: DC
  Administered 2019-02-27 – 2019-02-28 (×2): 2 via RESPIRATORY_TRACT
  Filled 2019-02-27 (×2): qty 8.8

## 2019-02-27 MED ORDER — ALBUTEROL SULFATE (2.5 MG/3ML) 0.083% IN NEBU: 2.5000 mg | INHALATION_SOLUTION | RESPIRATORY_TRACT | Status: DC | PRN

## 2019-02-27 MED ORDER — ALBUTEROL SULFATE HFA 108 (90 BASE) MCG/ACT IN AERS
1.0000 | INHALATION_SPRAY | Freq: Once | RESPIRATORY_TRACT | Status: AC
  Administered 2019-02-27: 08:00:00 1 via RESPIRATORY_TRACT
  Filled 2019-02-27: qty 6.7

## 2019-02-27 MED ORDER — AMLODIPINE BESYLATE 10 MG PO TABS
10.0000 mg | ORAL_TABLET | Freq: Every day | ORAL | Status: DC
  Administered 2019-02-27 – 2019-03-02 (×4): 10 mg via ORAL
  Filled 2019-02-27 (×4): qty 1

## 2019-02-27 MED ORDER — AZITHROMYCIN 500 MG PO TABS
500.0000 mg | ORAL_TABLET | Freq: Every day | ORAL | Status: DC
  Administered 2019-02-28 – 2019-03-02 (×3): 500 mg via ORAL
  Filled 2019-02-27 (×4): qty 1

## 2019-02-27 MED ORDER — SODIUM CHLORIDE 0.9 % IV SOLN
500.0000 mg | INTRAVENOUS | Status: AC
  Administered 2019-02-27: 500 mg via INTRAVENOUS
  Filled 2019-02-27: qty 500

## 2019-02-27 MED ORDER — LISINOPRIL 40 MG PO TABS
40.0000 mg | ORAL_TABLET | Freq: Every day | ORAL | Status: DC
  Administered 2019-02-27 – 2019-03-02 (×4): 40 mg via ORAL
  Filled 2019-02-27 (×5): qty 1

## 2019-02-27 MED ORDER — ALBUTEROL SULFATE HFA 108 (90 BASE) MCG/ACT IN AERS: 2.0000 | INHALATION_SPRAY | RESPIRATORY_TRACT | Status: DC | PRN

## 2019-02-27 MED ORDER — METHYLPREDNISOLONE SODIUM SUCC 125 MG IJ SOLR
125.0000 mg | Freq: Once | INTRAMUSCULAR | Status: AC
  Administered 2019-02-27: 125 mg via INTRAVENOUS
  Filled 2019-02-27: qty 2

## 2019-02-27 MED ORDER — ENOXAPARIN SODIUM 40 MG/0.4ML ~~LOC~~ SOLN
40.0000 mg | SUBCUTANEOUS | Status: DC
  Administered 2019-02-27 – 2019-03-01 (×3): 40 mg via SUBCUTANEOUS
  Filled 2019-02-27 (×3): qty 0.4

## 2019-02-27 MED ORDER — METHOCARBAMOL 500 MG PO TABS: 500.0000 mg | ORAL_TABLET | Freq: Three times a day (TID) | ORAL | Status: DC | PRN

## 2019-02-27 MED ORDER — FUROSEMIDE 10 MG/ML IJ SOLN
40.0000 mg | Freq: Once | INTRAMUSCULAR | Status: AC
  Administered 2019-02-27: 10:00:00 40 mg via INTRAVENOUS
  Filled 2019-02-27: qty 4

## 2019-02-27 MED ORDER — FUROSEMIDE 10 MG/ML IJ SOLN
40.0000 mg | Freq: Two times a day (BID) | INTRAMUSCULAR | Status: DC
  Administered 2019-02-27: 40 mg via INTRAVENOUS
  Filled 2019-02-27: qty 4

## 2019-02-27 MED ORDER — NICOTINE 14 MG/24HR TD PT24
14.0000 mg | MEDICATED_PATCH | Freq: Every day | TRANSDERMAL | Status: DC
  Administered 2019-02-27 – 2019-03-02 (×3): 14 mg via TRANSDERMAL
  Filled 2019-02-27 (×3): qty 1

## 2019-02-27 MED ORDER — POTASSIUM CHLORIDE CRYS ER 20 MEQ PO TBCR
20.0000 meq | EXTENDED_RELEASE_TABLET | Freq: Two times a day (BID) | ORAL | Status: AC
  Administered 2019-02-27 – 2019-02-28 (×4): 20 meq via ORAL
  Filled 2019-02-27 (×5): qty 1

## 2019-02-27 NOTE — Progress Notes (Signed)
PT Cancellation Note  Patient Details Name: Shari Obrien MRN: [id number] DOB: 1956/07/24   Cancelled Treatment:    Reason Eval/Treat Not Completed: (P) Medical issues which prohibited therapy Pt currently on 15L O2 via NRB. RN request therapy follow back later.   Deloy Archey B. Beverely Risen PT, DPT Acute Rehabilitation Services Pager [phone] Office [phone]    Elon Alas Fleet 02/27/2019, 2:48 PM

## 2019-02-27 NOTE — Progress Notes (Addendum)
Nutrition Consult/Brief Note  RD working remotely  RD consulted via COPD Focused Protocol.  Wt Readings from Last 15 Encounters:  02/27/19 109.5 kg  07/04/18 104.6 kg  08/15/17 106.9 kg  07/04/17 106.1 kg  08/06/16 102.1 kg  07/05/16 98.2 kg  07/03/16 99.2 kg  01/19/16 96.2 kg  03/14/15 102.5 kg  09/23/14 101.2 kg  08/30/13 101.5 kg    Body mass index is 38.96 kg/m. Patient meets criteria for Obesity Class II based on current BMI.   RD spoke with pt over room phone. Reports a good appetite. Enjoyed her lunch today.  Current diet order is Heart Healthy. Labs and medications reviewed.   No nutrition interventions warranted at this time. If nutrition issues arise, please consult RD.   Maureen Chatters, RD, LDN Pager #: [phone] After-Hours Pager #: [phone]

## 2019-02-27 NOTE — Progress Notes (Signed)
Bilateral lower extremity venous duplex has been completed. Preliminary results can be found in CV Proc through chart review.   02/27/19 2:29 PM Olen Cordial RVT

## 2019-02-27 NOTE — ED Triage Notes (Signed)
Pt in from home with c/o sob since yesterday. Hx of COPD and CHF, has been out of her Lasix x 1 wk. Denies any cp, n/v or fevers. RA sats 71% on arrival, placed on NRB. Increased BLE edema, pitting noted

## 2019-02-27 NOTE — Progress Notes (Signed)
PT Cancellation Note  Patient Details Name: Shari Obrien MRN: [id number] DOB: 1956-01-06   Cancelled Treatment:    Reason Eval/Treat Not Completed: Patient at procedure or test/unavailable. Pt with staff present in room to perform a test. Will check back as schedule allows to initiate PT evaluation.    Marylynn Pearson 02/27/2019, 2:06 PM  Conni Slipper, PT, DPT Acute Rehabilitation Services Pager: [phone] Office: [phone]

## 2019-02-27 NOTE — Progress Notes (Signed)
  Pt orientation to unit, room and routine. Information packet given to patient/family and safety video watched.  Admission INP armband ID verified with patient/family, and in place. SR up x 2, fall risk assessment complete with Patient  verbalizing understanding of risks associated with falls. Pt verbalizes an understanding of how to use the call bell and to call for help before getting out of bed.  Skin, clean-dry- intact without evidence of bruising, or skin tears.   No evidence of skin break down noted on exam. Pt placed on the PC monitor no SOB or complaints of pain at this time.    Shari Obrien, [place] 02/27/2019 6:15 PM
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
## 2019-02-27 NOTE — ED Notes (Signed)
ED TO INPATIENT HANDOFF REPORT  ED Nurse Name and Phone #: [phone]  S Name/Age/Gender Shari Obrien 63 y.o. female Room/Bed: 033C/033C  Code Status   Code Status: Prior  Home/SNF/Other Home Patient oriented to: self, place, time and situation Is this baseline? Yes   Triage Complete: Triage complete  Chief Complaint SOB  Triage Note Pt in from home with c/o sob since yesterday. Hx of COPD and CHF, has been out of her Lasix x 1 wk. Denies any cp, n/v or fevers. RA sats 71% on arrival, placed on NRB. Increased BLE edema, pitting noted   Allergies Allergies  Allergen Reactions  . Periactin [Cyproheptadine] Swelling    Level of Care/Admitting Diagnosis ED Disposition    ED Disposition Condition Comment   Admit  [facility]ospital [facility] [[id number]]  Level of Care: Progressive [102]  Diagnosis: Respiratory failure with hypoxia [facility](HCC) [[id number]]  Admitting Physician: Dorcas CarrowGHIMIRE, KUBER [[id number]]  Attending Physician: Dorcas CarrowGHIMIRE, KUBER [[id number]]  Estimated length of stay: past midnight tomorrow  Certification:: I certify this patient will need inpatient services for at least 2 midnights  PT Class (Do Not Modify): Inpatient [101]  PT Acc Code (Do Not Modify): Private [1]       B Medical/Surgery History Past Medical History:  Diagnosis Date  . Aortic atherosclerosis (HCC)   . CHF (congestive heart failure) (HCC)   . COPD (chronic obstructive pulmonary disease) (HCC) Dx 2013  . HTN (hypertension) 08/30/2013  . Hypertension   . Lower leg edema 06/2016   Past Surgical History:  Procedure Laterality Date  . ABDOMINAL HYSTERECTOMY    . BACK SURGERY       A IV Location/Drains/Wounds Patient Lines/Drains/Airways Status   Active Line/Drains/Airways    Name:   Placement date:   Placement time:   Site:   Days:   Peripheral IV 02/27/19 Right Antecubital   02/27/19    0812    Antecubital   less than 1          Intake/Output Last 24 hours No intake or output  data in the 24 hours ending 02/27/19 1202  Labs/Imaging Results for orders placed or performed during the hospital encounter of 02/27/19 (from the past 48 hour(s))  Basic metabolic panel     Status: Abnormal   Collection Time: 02/27/19  8:06 AM  Result Value Ref Range   Sodium 141 135 - 145 mmol/L   Potassium 3.6 3.5 - 5.1 mmol/L   Chloride 105 98 - 111 mmol/L   CO2 24 22 - 32 mmol/L   Glucose, Bld 108 (H) 70 - 99 mg/dL   BUN 16 8 - 23 mg/dL   Creatinine, Ser 2.950.86 0.44 - 1.00 mg/dL   Calcium 8.8 (L) 8.9 - 10.3 mg/dL   GFR calc non Af Amer >60 >60 mL/min   GFR calc Af Amer >60 >60 mL/min   Anion gap 12 5 - 15    Comment: Performed at [facility], [address] [place], [place] [address]  CBC with Differential     Status: Abnormal   Collection Time: 02/27/19  8:06 AM  Result Value Ref Range   WBC 6.5 4.0 - 10.5 K/uL   RBC 6.50 (H) 3.87 - 5.11 MIL/uL   Hemoglobin 18.8 (H) 12.0 - 15.0 g/dL   HCT 86.559.3 (H) 78.436.0 - 69.646.0 %   MCV 91.2 80.0 - 100.0 fL   MCH 28.9 26.0 - 34.0 pg   MCHC 31.7 30.0 - 36.0 g/dL  RDW 18.1 (H) 11.5 - 15.5 %   Platelets 216 150 - 400 K/uL   nRBC 0.0 0.0 - 0.2 %   Neutrophils Relative % 45 %   Neutro Abs 2.9 1.7 - 7.7 K/uL   Lymphocytes Relative 38 %   Lymphs Abs 2.4 0.7 - 4.0 K/uL   Monocytes Relative 12 %   Monocytes Absolute 0.8 0.1 - 1.0 K/uL   Eosinophils Relative 3 %   Eosinophils Absolute 0.2 0.0 - 0.5 K/uL   Basophils Relative 1 %   Basophils Absolute 0.1 0.0 - 0.1 K/uL   Immature Granulocytes 1 %   Abs Immature Granulocytes 0.03 0.00 - 0.07 K/uL    Comment: Performed at [facility], [address] [place], [place] [address]  Brain natriuretic peptide     Status: Abnormal   Collection Time: 02/27/19  8:06 AM  Result Value Ref Range   B Natriuretic Peptide 142.3 (H) 0.0 - 100.0 pg/mL    Comment: Performed at [facility], [address] [place], [place] [address]   Dg Chest Portable 1 View  Result Date:  02/27/2019 CLINICAL DATA:  Shortness of breath for 2 days EXAM: PORTABLE CHEST 1 VIEW COMPARISON:  06/29/2016 FINDINGS: Cardiac shadow is enlarged. Aortic calcifications are noted. The lungs are well aerated bilaterally with small pleural effusions and mild central vascular congestion. Mild bibasilar atelectatic changes are noted. No bony abnormality is seen. IMPRESSION: Bibasilar atelectasis and small pleural effusions. Mild vascular congestion is noted. Electronically Signed   By: Alcide Clever M.D.   On: 02/27/2019 08:32    Pending Labs Unresulted Labs (From admission, onward)    Start     Ordered   Signed and Armed forces training and education officer morning,   R     Signed and Held   Signed and Held  CBC  Tomorrow morning,   R     Signed and Held   Signed and Held  Culture, sputum-assessment  Once,   R     Signed and Held          Vitals/Pain Today's Vitals   02/27/19 1015 02/27/19 1030 02/27/19 1045 02/27/19 1100  BP: (!) 162/89 (!) 158/79 (!) 152/85 (!) 163/92  Pulse: (!) 101 99 (!) 102 (!) 117  Resp: 19 18 19  (!) 24  Temp:      TempSrc:      SpO2: 93% 96% 95% 96%  Weight:      PainSc:        Isolation Precautions Droplet precaution  Medications Medications  albuterol (PROVENTIL HFA;VENTOLIN HFA) 108 (90 Base) MCG/ACT inhaler 1 puff (1 puff Inhalation Given 02/27/19 0818)  ipratropium-albuterol (DUONEB) 0.5-2.5 (3) MG/3ML nebulizer solution 3 mL (3 mLs Nebulization Given 02/27/19 0818)  methylPREDNISolone sodium succinate (SOLU-MEDROL) 125 mg/2 mL injection 125 mg (125 mg Intravenous Given 02/27/19 0818)  furosemide (LASIX) injection 40 mg (40 mg Intravenous Given 02/27/19 0930)    Mobility walks High fall risk   Focused Assessments Cardiac Assessment Handoff:    Lab Results  Component Value Date   CKTOTAL 133 05/16/2008   CKMB 2.9 05/16/2008   TROPONINI <0.03 06/29/2016   Lab Results  Component Value Date   DDIMER 1.91 (H) 07/28/2018   Does the Patient currently  have chest pain? No    R Recommendations: See Admitting Provider Note  Report given to:   Additional Notes:  pt wearing NRB 15L [place]

## 2019-02-27 NOTE — H&P (Signed)
History and Physical    [name]:[id number] DOB: 1956/06/09 DOA: 02/27/2019  PCP: Hoy Register, MD  Patient coming from: home   I have personally briefly reviewed patient's old medical records available.   Chief Complaint: SOB   HPI: Shari Obrien is a 63 y.o. female with medical history significant of COPD, ongoing smoker, history of diastolic heart failure and chronic leg edema, hypertension who is presenting to the emergency room with 1 day history of shortness of breath and leg swelling and edema.  According to the patient, she was doing well until about a week ago.  She ran out of the medications including her Lasix. Patient is started developing leg swelling, right more than left with pain on walking since last 1 week.  She had similar symptoms in the past and this will improve with diuretics. Since yesterday, she started having more shortness of breath, associated cough with white mucoid sputum production. No fever chills.  No flulike symptoms.  Denies any body ache or myalgias.  Denies any nausea vomiting.  Denies any chest pain.  Lives by herself.  She has not come across anybody who has virus like symptoms.  Urine habits are normal.  Bowel movements are normal.  Denies any history of DVT or PE. Last known ejection fraction 65% from 2017. ED Course: 71% on room air.  She was treated with nonrebreather, given albuterol nebulizer, IV Solu-Medrol, 40 mg of IV Lasix with clinical improvement.  However, patient continues to need nonrebreather.  Patient is currently comfortable and is able to finish sentences. Chest x-ray shows bilateral lower lobe atelectasis and prominent vascular markings mostly on the right side.  Also has bilateral small pleural effusions.  No evidence of consolidation or pneumonia. Due to persistent symptoms and high flow oxygen requirement, she will need hospitalization.  Review of Systems: As per HPI otherwise 10 point review of systems negative.     Past Medical History:  Diagnosis Date   Aortic atherosclerosis (HCC)    CHF (congestive heart failure) (HCC)    COPD (chronic obstructive pulmonary disease) (HCC) Dx 2013   HTN (hypertension) 08/30/2013   Hypertension    Lower leg edema 06/2016    Past Surgical History:  Procedure Laterality Date   ABDOMINAL HYSTERECTOMY     BACK SURGERY       reports that she has been smoking cigarettes. She has a 19.00 pack-year smoking history. She has never used smokeless tobacco. She reports that she does not drink alcohol or use drugs.  Allergies  Allergen Reactions   Periactin [Cyproheptadine] Swelling    Family History  Problem Relation Age of Onset   Hypertension Maternal Grandmother      Prior to Admission medications   Medication Sig Start Date End Date Taking? Authorizing Provider  albuterol (VENTOLIN HFA) 108 (90 Base) MCG/ACT inhaler Inhale 2 puffs into the lungs every 6 (six) hours as needed for wheezing or shortness of breath. 07/04/18   Hoy Register, MD  amLODipine (NORVASC) 10 MG tablet Take 1 tablet (10 mg total) by mouth daily. 07/04/18   Hoy Register, MD  cetirizine (ZYRTEC) 10 MG tablet Take 10 mg by mouth daily as needed for allergies.     [provider]  diclofenac sodium (VOLTAREN) 1 % GEL Apply 2 g topically 4 (four) times daily. 09/30/14   Ambrose Finland, NP  Fluticasone-Salmeterol (ADVAIR DISKUS) 100-50 MCG/DOSE AEPB Inhale 1 puff into the lungs 2 (two) times daily. 07/04/18   Newlin, Odette Horns,  MD  furosemide (LASIX) 40 MG tablet TAKE 1 TABLET (40 MG TOTAL) BY MOUTH DAILY. AS NEEDED FOR PEDAL EDEMA. MUST MAKE APPT FOR FURTHER REFILLS 02/14/19   Hoy Register, MD  lisinopril (PRINIVIL,ZESTRIL) 40 MG tablet Take 1 tablet (40 mg total) by mouth daily. 07/04/18   Hoy Register, MD  methocarbamol (ROBAXIN) 500 MG tablet Take 1 tablet (500 mg total) by mouth every 8 (eight) hours as needed for muscle spasms. 01/04/19   Benjiman Core, MD   triamcinolone (KENALOG) 0.025 % ointment APPLY 1 APPLICATION TOPICALLY 2 TIMES DAILY. Patient not taking: Reported on 08/15/2017 08/06/16   Dessa Phi, MD    Physical Exam: Vitals:   02/27/19 0900 02/27/19 0915 02/27/19 0930 02/27/19 1015  BP: (!) 172/88 (!) 156/98 (!) 157/88 (!) 162/89  Pulse: (!) 103   (!) 101  Resp: 17 19 17 19   Temp:      TempSrc:      SpO2: 97%   93%  Weight:        Constitutional: NAD, calm, comfortable Vitals:   02/27/19 0900 02/27/19 0915 02/27/19 0930 02/27/19 1015  BP: (!) 172/88 (!) 156/98 (!) 157/88 (!) 162/89  Pulse: (!) 103   (!) 101  Resp: 17 19 17 19   Temp:      TempSrc:      SpO2: 97%   93%  Weight:       Eyes: PERRL, lids and conjunctivae normal Currently comfortable on nonrebreather.  Able to finish sentences. ENMT: Mucous membranes are moist. Posterior pharynx clear of any exudate or lesions.Normal dentition.  Neck: normal, supple, no masses, no thyromegaly Respiratory: Bilateral poor air entry.  No other added sounds.  Mild respiratory stress.  No accessory muscle use.  Cardiovascular: Regular rate and rhythm, no murmurs / rubs / gallops. No extremity edema. 2+ pedal pulses. No carotid bruits.  Tachycardic. Abdomen: no tenderness, no masses palpated. No hepatosplenomegaly. Bowel sounds positive.  Obese and pendulous.   Musculoskeletal: no clubbing / cyanosis. No joint deformity upper and lower extremities. Good ROM, no contractures. Normal muscle tone.  Skin: no rashes, lesions, ulcers. No induration Neurologic: CN 2-12 grossly intact. Sensation intact, DTR normal. Strength 5/5 in all 4.  Psychiatric: Normal judgment and insight. Alert and oriented x 3. Normal mood.     Labs on Admission: I have personally reviewed following labs and imaging studies  CBC: Recent Labs  Lab 02/27/19 0806  WBC 6.5  NEUTROABS 2.9  HGB 18.8*  HCT 59.3*  MCV 91.2  PLT 216   Basic Metabolic Panel: Recent Labs  Lab 02/27/19 0806  NA 141  K  3.6  CL 105  CO2 24  GLUCOSE 108*  BUN 16  CREATININE 0.86  CALCIUM 8.8*   GFR: CrCl cannot be calculated (Unknown ideal weight.). Liver Function Tests: No results for input(s): AST, ALT, ALKPHOS, BILITOT, PROT, ALBUMIN in the last 168 hours. No results for input(s): LIPASE, AMYLASE in the last 168 hours. No results for input(s): AMMONIA in the last 168 hours. Coagulation Profile: No results for input(s): INR, PROTIME in the last 168 hours. Cardiac Enzymes: No results for input(s): CKTOTAL, CKMB, CKMBINDEX, TROPONINI in the last 168 hours. BNP (last 3 results) No results for input(s): PROBNP in the last 8760 hours. HbA1C: No results for input(s): HGBA1C in the last 72 hours. CBG: No results for input(s): GLUCAP in the last 168 hours. Lipid Profile: No results for input(s): CHOL, HDL, LDLCALC, TRIG, CHOLHDL, LDLDIRECT in the last 72 hours. Thyroid  Function Tests: No results for input(s): TSH, T4TOTAL, FREET4, T3FREE, THYROIDAB in the last 72 hours. Anemia Panel: No results for input(s): VITAMINB12, FOLATE, FERRITIN, TIBC, IRON, RETICCTPCT in the last 72 hours. Urine analysis: No results found for: COLORURINE, APPEARANCEUR, LABSPEC, PHURINE, GLUCOSEU, HGBUR, BILIRUBINUR, KETONESUR, PROTEINUR, UROBILINOGEN, NITRITE, LEUKOCYTESUR  Radiological Exams on Admission: Dg Chest Portable 1 View  Result Date: 02/27/2019 CLINICAL DATA:  Shortness of breath for 2 days EXAM: PORTABLE CHEST 1 VIEW COMPARISON:  06/29/2016 FINDINGS: Cardiac shadow is enlarged. Aortic calcifications are noted. The lungs are well aerated bilaterally with small pleural effusions and mild central vascular congestion. Mild bibasilar atelectatic changes are noted. No bony abnormality is seen. IMPRESSION: Bibasilar atelectasis and small pleural effusions. Mild vascular congestion is noted. Electronically Signed   By: Alcide Clever M.D.   On: 02/27/2019 08:32    EKG: Independently reviewed.  Sinus tachycardia.  No acute  changes.  Assessment/Plan Principal Problem:   Respiratory failure with hypoxia (HCC) Active Problems:   Essential hypertension   Diastolic heart failure (HCC)   Current smoker   Lower leg edema   Polycythemia     1.  Acute respiratory failure with hypoxia due to COPD exacerbation: Agree with admission to monitored unit because of severity of symptoms. Aggressive bronchodilator therapy, IV steroids given.  Will change to oral steroids. inhalational steroids, scheduled and as needed bronchodilators, deep breathing exercises, incentive spirometry, chest physiotherapy and respiratory therapy consult. Antibiotics due to severity of symptoms.  We will treat with 5 days of azithromycin. Supplemental oxygen to keep saturations more than 92%. Wean off oxygen as able.  2.  COVID-19 screening: Not indicated at this time.  Will use a standard precaution and universal masking policy.  Patient has no fever congestion or URI symptoms.  3.  History of diastolic heart failure with worsening leg edema: Suspect noncompliance.  Will treat with IV Lasix in the hospital and monitor levels.  Daily weight.  Intake and output monitoring.  Not repeating echocardiogram at this time. Her leg swellings are probably chronic and with dependent edema.  However, they are asymmetrical and some tenderness.  Patient is sedentary.  Will check lower extremity duplexes to rule out any DVTs.  4.  Hypertension: Blood pressures are stable.  Resume home medications.  5.  Smoker: Counseled to quit.  Will provide nicotine patch.    DVT prophylaxis: Lovenox subcu Code Status: Full code Family Communication: None Disposition Plan: Home when is stable Consults called: None Admission status: Inpatient progressive care unit   Dorcas Carrow MD [facility] Pager [phone]  If 7PM-7AM, please contact night-coverage [email or web address] Password [email or web address]  02/27/2019, 11:11 AM

## 2019-02-28 DIAGNOSIS — J441 Chronic obstructive pulmonary disease with (acute) exacerbation: Secondary | ICD-10-CM

## 2019-02-28 LAB — CBC
HCT: 56.5 % — ABNORMAL HIGH (ref 36.0–46.0)
Hemoglobin: 17.4 g/dL — ABNORMAL HIGH (ref 12.0–15.0)
MCH: 27.7 pg (ref 26.0–34.0)
MCHC: 30.8 g/dL (ref 30.0–36.0)
MCV: 90 fL (ref 80.0–100.0)
Platelets: 206 10*3/uL (ref 150–400)
RBC: 6.28 MIL/uL — ABNORMAL HIGH (ref 3.87–5.11)
RDW: 17.5 % — ABNORMAL HIGH (ref 11.5–15.5)
WBC: 5.6 10*3/uL (ref 4.0–10.5)
nRBC: 0 % (ref 0.0–0.2)

## 2019-02-28 LAB — BASIC METABOLIC PANEL
Anion gap: 8 (ref 5–15)
BUN: 21 mg/dL (ref 8–23)
CO2: 26 mmol/L (ref 22–32)
Calcium: 8.4 mg/dL — ABNORMAL LOW (ref 8.9–10.3)
Chloride: 104 mmol/L (ref 98–111)
Creatinine, Ser: 1 mg/dL (ref 0.44–1.00)
GFR calc Af Amer: >60 mL/min (ref >60)
GFR calc non Af Amer: 60 mL/min — ABNORMAL LOW (ref >60)
Glucose, Bld: 162 mg/dL — ABNORMAL HIGH (ref 70–99)
Potassium: 3.7 mmol/L (ref 3.5–5.1)
Sodium: 138 mmol/L (ref 135–145)

## 2019-02-28 MED ORDER — BUDESONIDE 0.25 MG/2ML IN SUSP
0.2500 mg | Freq: Two times a day (BID) | RESPIRATORY_TRACT | Status: DC
  Administered 2019-02-28 – 2019-03-02 (×5): 0.25 mg via RESPIRATORY_TRACT
  Filled 2019-02-28 (×5): qty 2

## 2019-02-28 MED ORDER — GUAIFENESIN ER 600 MG PO TB12
600.0000 mg | ORAL_TABLET | Freq: Two times a day (BID) | ORAL | Status: DC
  Administered 2019-02-28 – 2019-03-02 (×3): 600 mg via ORAL
  Filled 2019-02-28 (×3): qty 1

## 2019-02-28 MED ORDER — FUROSEMIDE 10 MG/ML IJ SOLN
40.0000 mg | Freq: Three times a day (TID) | INTRAMUSCULAR | Status: DC
  Administered 2019-02-28 – 2019-03-02 (×7): 40 mg via INTRAVENOUS
  Filled 2019-02-28 (×7): qty 4

## 2019-02-28 MED ORDER — METHYLPREDNISOLONE SODIUM SUCC 125 MG IJ SOLR
60.0000 mg | Freq: Two times a day (BID) | INTRAMUSCULAR | Status: DC
  Administered 2019-02-28 – 2019-03-02 (×5): 60 mg via INTRAVENOUS
  Filled 2019-02-28 (×5): qty 2

## 2019-02-28 NOTE — Evaluation (Signed)
Physical Therapy Evaluation Patient Details Name: Shari Obrien MRN: [id number] DOB: [birth year] Today's Date: 02/28/2019   History of Present Illness  63 y.o. female with medical history significant of COPD, ongoing smoker, history of diastolic heart failure and chronic leg edema, hypertension who is presenting to the emergency room with 1 day history of shortness of breath and leg swelling and edema.    Clinical Impression  Pt admitted with above diagnosis. Pt currently with functional limitations due to the deficits listed below (see PT Problem List). PTA, pt living alone in level apartment, no home O2, independent with mobility. Today ambulating without assistance, desat on 6L slightly to 88%.  Pt will benefit from skilled PT to increase their independence and safety with mobility to allow discharge to the venue listed below.       Follow Up Recommendations No PT follow up    Equipment Recommendations  None recommended by PT    Recommendations for Other Services       Precautions / Restrictions Precautions Precautions: Fall;Other (comment) Precaution Comments: 7L O2 HFNC Restrictions Weight Bearing Restrictions: No      Mobility  Bed Mobility               General bed mobility comments: In chair at entry  Transfers Overall transfer level: Modified independent                  Ambulation/Gait Ambulation/Gait assistance: Modified independent (Device/Increase time) Gait Distance (Feet): 125 Feet Assistive device: None Gait Pattern/deviations: WFL(Within Functional Limits) Gait velocity: decreased   General Gait Details: ambulating without AD, desat to 89% on 6L no SOB  Stairs            Wheelchair Mobility    Modified Rankin (Stroke Patients Only)       Balance Overall balance assessment: Mild deficits observed, not formally tested                                           Pertinent Vitals/Pain      Home Living  Family/patient expects to be discharged to:: Private residence Living Arrangements: Alone Available Help at Discharge: Family;Available PRN/intermittently Type of Home: Apartment Home Access: Level entry       Home Equipment: Walker - 2 wheels      Prior Function Level of Independence: Independent         Comments: driving     Hand Dominance        Extremity/Trunk Assessment   Upper Extremity Assessment Upper Extremity Assessment: Defer to OT evaluation    Lower Extremity Assessment Lower Extremity Assessment: Overall WFL for tasks assessed    Cervical / Trunk Assessment Cervical / Trunk Assessment: Normal  Communication   Communication: No difficulties  Cognition Arousal/Alertness: Awake/alert Behavior During Therapy: WFL for tasks assessed/performed Overall Cognitive Status: Within Functional Limits for tasks assessed                                        General Comments      Exercises     Assessment/Plan    PT Assessment Patient needs continued PT services  PT Problem List Decreased strength       PT Treatment Interventions Stair training;DME instruction;Gait training;Therapeutic activities;Functional mobility training;Therapeutic exercise;Balance training  PT Goals (Current goals can be found in the Care Plan section)  Acute Rehab PT Goals PT Goal Formulation: With patient Potential to Achieve Goals: Good    Frequency Min 3X/week   Barriers to discharge        Co-evaluation               AM-PAC PT "6 Clicks" Mobility  Outcome Measure Help needed turning from your back to your side while in a flat bed without using bedrails?: None Help needed moving from lying on your back to sitting on the side of a flat bed without using bedrails?: None Help needed moving to and from a bed to a chair (including a wheelchair)?: A Little Help needed standing up from a chair using your arms (e.g., wheelchair or bedside chair)?: A  Little Help needed to walk in hospital room?: A Little Help needed climbing 3-5 steps with a railing? : A Little 6 Click Score: 20    End of Session Equipment Utilized During Treatment: Gait belt;Oxygen Activity Tolerance: Patient tolerated treatment well Patient left: in chair Nurse Communication: Mobility status PT Visit Diagnosis: Unsteadiness on feet (R26.81)    Time: 4742-5956 PT Time Calculation (min) (ACUTE ONLY): 23 min   Charges:   PT Evaluation $PT Eval Low Complexity: 1 Low PT Treatments $Gait Training: 8-22 mins        Etta Grandchild, PT, DPT Acute Rehabilitation Services Pager: [phone] Office: [phone]    Etta Grandchild 02/28/2019, 1:20 PM

## 2019-02-28 NOTE — Progress Notes (Signed)
Pt up from bed and in chair throughout most of the day without any complaints. Pt on 4L HFNC, oxygen sat 97%  In bed  tolerating well.

## 2019-02-28 NOTE — Progress Notes (Signed)
PROGRESS NOTE        PATIENT DETAILS Name: Shari Obrien Age: 63 y.o. Sex: female Date of Birth: 09-Dec-1955 Admit Date: 02/27/2019 Admitting Physician Dorcas Carrow, MD YNW:[id number], Odette Horns, MD  Brief Narrative: Patient is a 63 y.o. female with history of COPD, chronic diastolic heart failure, tobacco use presented to the hospital with worsening shortness of breath, cough, and leg edema for 1 week.  Thought to have acute hypoxic respiratory failure due to a combination of COPD exacerbation and decompensated diastolic heart failure.  See below for further details  Subjective: Feels better-says that she is now able to talk and complain which is always a good sign.  Assessment/Plan: Acute hypoxic respiratory failure secondary to decompensated diastolic heart failure and COPD exacerbation: Slowly improving-still wheezing-and with significant lower extremity edema.  Continue IV steroids, bronchodilators-change Lasix to 3 times daily dosing.  Follow daily weights, electrolytes and intake/output.  Will attempt to titrate off oxygen as tolerated.  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  HTN: Controlled-continue lisinopril, amlodipine.  Tobacco abuse: Counseled  DVT Prophylaxis: Prophylactic Lovenox   Code Status: Full code   Family Communication: None at bedside  Disposition Plan: Remain inpatient  Antimicrobial agents: Anti-infectives (From admission, onward)   Start     Dose/Rate Route Frequency Ordered Stop   02/28/19 1330  azithromycin (ZITHROMAX) tablet 500 mg     500 mg Oral Daily 02/27/19 1320 03/04/19 0959   02/27/19 1320  azithromycin (ZITHROMAX) 500 mg in sodium chloride 0.9 % 250 mL IVPB     500 mg 250 mL/hr over 60 Minutes Intravenous Every 24 hours 02/27/19 1320 02/27/19 1745      Procedures: None  CONSULTS:  None  Time spent: 25- minutes-Greater than 50% of  this time was spent in counseling, explanation of diagnosis, planning of further management, and coordination of care.  MEDICATIONS: Scheduled Meds:  amLODipine  10 mg Oral Daily   azithromycin  500 mg Oral Daily   enoxaparin (LOVENOX) injection  40 mg Subcutaneous Q24H   furosemide  40 mg Intravenous Q8H   ipratropium-albuterol  3 mL Nebulization Q6H   lisinopril  40 mg Oral Daily   methylPREDNISolone (SOLU-MEDROL) injection  60 mg Intravenous Q12H   mometasone-formoterol  2 puff Inhalation BID   nicotine  14 mg Transdermal Daily   potassium chloride  20 mEq Oral BID   Continuous Infusions: PRN Meds:.albuterol, methocarbamol   PHYSICAL EXAM: Vital signs: Vitals:   02/28/19 0757 02/28/19 0807 02/28/19 0834 02/28/19 1002  BP:    131/71  Pulse:      Resp:      Temp: 98 F (36.7 C)     TempSrc: Oral     SpO2:  97% 99%   Weight:       Filed Weights   02/27/19 0805 02/27/19 1256  Weight: 104.6 kg 109.5 kg   Body mass index is 38.96 kg/m.   General appearance :Awake, alert, not in any distress.  Eyes:, pupils equally reactive to light and accomodation,no scleral icterus.Pink conjunctiva HEENT: Atraumatic and Normocephalic Neck: supple, no JVD. No cervical lymphadenopathy. No thyromegaly Resp:Good air entry bilaterally, rhonchi scattered all over CVS: S1 S2 regular, no murmurs.  GI: Bowel sounds present, Non tender and not distended with no gaurding, rigidity or rebound.No organomegaly Extremities: B/L Lower Ext  shows 2+ edema, both legs are warm to touch Neurology:  speech clear,Non focal, sensation is grossly intact. Psychiatric: Normal judgment and insight. Alert and oriented x 3. Normal mood. Musculoskeletal:No digital cyanosis Skin:No Rash, warm and dry Wounds:N/A  I have personally reviewed following labs and imaging studies  LABORATORY DATA: CBC: Recent Labs  Lab 02/27/19 0806 02/28/19 0256  WBC 6.5 5.6  NEUTROABS 2.9  --   HGB 18.8* 17.4*    HCT 59.3* 56.5*  MCV 91.2 90.0  PLT 216 206    Basic Metabolic Panel: Recent Labs  Lab 02/27/19 0806 02/28/19 0256  NA 141 138  K 3.6 3.7  CL 105 104  CO2 24 26  GLUCOSE 108* 162*  BUN 16 21  CREATININE 0.86 1.00  CALCIUM 8.8* 8.4*    GFR: CrCl cannot be calculated (Unknown ideal weight.).  Liver Function Tests: No results for input(s): AST, ALT, ALKPHOS, BILITOT, PROT, ALBUMIN in the last 168 hours. No results for input(s): LIPASE, AMYLASE in the last 168 hours. No results for input(s): AMMONIA in the last 168 hours.  Coagulation Profile: No results for input(s): INR, PROTIME in the last 168 hours.  Cardiac Enzymes: No results for input(s): CKTOTAL, CKMB, CKMBINDEX, TROPONINI in the last 168 hours.  BNP (last 3 results) No results for input(s): PROBNP in the last 8760 hours.  HbA1C: No results for input(s): HGBA1C in the last 72 hours.  CBG: No results for input(s): GLUCAP in the last 168 hours.  Lipid Profile: No results for input(s): CHOL, HDL, LDLCALC, TRIG, CHOLHDL, LDLDIRECT in the last 72 hours.  Thyroid Function Tests: No results for input(s): TSH, T4TOTAL, FREET4, T3FREE, THYROIDAB in the last 72 hours.  Anemia Panel: No results for input(s): VITAMINB12, FOLATE, FERRITIN, TIBC, IRON, RETICCTPCT in the last 72 hours.  Urine analysis: No results found for: COLORURINE, APPEARANCEUR, LABSPEC, PHURINE, GLUCOSEU, HGBUR, BILIRUBINUR, KETONESUR, PROTEINUR, UROBILINOGEN, NITRITE, LEUKOCYTESUR  Sepsis Labs: Lactic Acid, Venous No results found for: LATICACIDVEN  MICROBIOLOGY: Recent Results (from the past 240 hour(s))  MRSA PCR Screening     Status: None   Collection Time: 02/27/19  1:24 PM  Result Value Ref Range Status   MRSA by PCR NEGATIVE NEGATIVE Final    Comment:        The GeneXpert MRSA Assay (FDA approved for NASAL specimens only), is one component of a comprehensive MRSA colonization surveillance program. It is not intended to  diagnose MRSA infection nor to guide or monitor treatment for MRSA infections. Performed at [facility], [address], [place], [place] [address]     RADIOLOGY STUDIES/RESULTS: Dg Chest Portable 1 View  Result Date: 02/27/2019 CLINICAL DATA:  Shortness of breath for 2 days EXAM: PORTABLE CHEST 1 VIEW COMPARISON:  06/29/2016 FINDINGS: Cardiac shadow is enlarged. Aortic calcifications are noted. The lungs are well aerated bilaterally with small pleural effusions and mild central vascular congestion. Mild bibasilar atelectatic changes are noted. No bony abnormality is seen. IMPRESSION: Bibasilar atelectasis and small pleural effusions. Mild vascular congestion is noted. Electronically Signed   By: Alcide Clever M.D.   On: 02/27/2019 08:32   Vas [place] Lower Extremity Venous (dvt)  Result Date: 02/27/2019  Lower Venous Study Indications: Pain, and Swelling.  Performing Technologist: Chanda Busing RVT  Examination Guidelines: A complete evaluation includes B-mode imaging, spectral Doppler, color Doppler, and power Doppler as needed of all accessible portions of each vessel. Bilateral testing is considered an integral part of a complete examination. Limited examinations for  reoccurring indications may be performed as noted.  Right Venous Findings: +---------+---------------+---------+-----------+----------+-------+            Compressibility Phasicity Spontaneity Properties Summary  +---------+---------------+---------+-----------+----------+-------+  CFV       Full            Yes       Yes                             +---------+---------------+---------+-----------+----------+-------+  SFJ       Full                                                      +---------+---------------+---------+-----------+----------+-------+  FV Prox   Full                                                      +---------+---------------+---------+-----------+----------+-------+  FV Mid    Full                                                       +---------+---------------+---------+-----------+----------+-------+  FV Distal                 Yes       Yes                             +---------+---------------+---------+-----------+----------+-------+  PFV       Full                                                      +---------+---------------+---------+-----------+----------+-------+  POP       Full            Yes       Yes                             +---------+---------------+---------+-----------+----------+-------+  PTV       Full                                                      +---------+---------------+---------+-----------+----------+-------+  PERO      Full                                                      +---------+---------------+---------+-----------+----------+-------+  Left Venous Findings: +---------+---------------+---------+-----------+----------+-------+            Compressibility Phasicity Spontaneity Properties Summary  +---------+---------------+---------+-----------+----------+-------+  CFV  Full            Yes       Yes                             +---------+---------------+---------+-----------+----------+-------+  SFJ       Full                                                      +---------+---------------+---------+-----------+----------+-------+  FV Prox   Full                                                      +---------+---------------+---------+-----------+----------+-------+  FV Mid    Full                                                      +---------+---------------+---------+-----------+----------+-------+  FV Distal Full                                                      +---------+---------------+---------+-----------+----------+-------+  PFV       Full                                                      +---------+---------------+---------+-----------+----------+-------+  POP       Full            Yes       Yes                              +---------+---------------+---------+-----------+----------+-------+  PTV       Full                                                      +---------+---------------+---------+-----------+----------+-------+  PERO      Full                                                      +---------+---------------+---------+-----------+----------+-------+    Summary: Right: There is no evidence of deep vein thrombosis in the lower extremity. No cystic structure found in the popliteal fossa. Left: There is no evidence of deep vein thrombosis in the lower extremity. No cystic structure found in the popliteal fossa.  *See table(s) above for measurements and observations. Electronically signed by Waverly Ferrari MD on 02/27/2019 at  5:18:02 PM.    Final      LOS: 1 day   Jeoffrey Massed, MD  [facility]  If 7PM-7AM, please contact night-coverage  Please page via [email or web address]  Go to [email or web address] and use [facility]'s universal password to access. If you do not have the password, please contact the hospital operator.  Locate the [facility] provider you are looking for under [facility] and page to a number that you can be directly reached. If you still have difficulty reaching the provider, please page the [facility] (Director on Call) for the Hospitalists listed on amion for assistance.  02/28/2019, 11:31 AM

## 2019-02-28 NOTE — Evaluation (Signed)
Occupational Therapy Evaluation Patient Details Name: Shari Obrien MRN: [id number] DOB: 1956-10-11 Today's Date: 02/28/2019    History of Present Illness 63 y.o. female with medical history significant of COPD, ongoing smoker, history of diastolic heart failure and chronic leg edema, hypertension who is presenting to the emergency room with 1 day history of shortness of breath and leg swelling and edema.    Clinical Impression   Pt PTA: living alone, working and independent with  ADL and mobility. Pt currently, performing ADL at sink in sitting and standing. Pt with tub shower at home and fearful of not being able to use it as she lives alone. Pt performing ADL functional mobility with RW and supervisionA for O2 sat- Pt desats to 88% on 6L O2 s/p exertion and with pursed lip breathing increases >90%. Pt returned to recliner where PT began their session. Pt would benefit from continued OT for tub transfer, energy conservation and O2 saturations. OT to follow acutely.    Follow Up Recommendations  No OT follow up;Supervision - Intermittent(tub transfers)    Equipment Recommendations  Tub/shower bench;Tub/shower seat    Recommendations for Other Services       Precautions / Restrictions Precautions Precautions: Fall;Other (comment) Precaution Comments: 7L O2 HFNC Restrictions Weight Bearing Restrictions: No      Mobility Bed Mobility Overal bed mobility: Modified Independent             General bed mobility comments: In chair at entry  Transfers Overall transfer level: Modified independent                    Balance Overall balance assessment: Mild deficits observed, not formally tested                                         ADL either performed or assessed with clinical judgement   ADL Overall ADL's : At baseline                                       General ADL Comments: Pt requiring increased time and o2 at this time  for all ADL. Pt plans to sit at sink to bathe at home until she feels well enough to step into tub.     Vision Baseline Vision/History: No visual deficits Vision Assessment?: No apparent visual deficits     Perception     Praxis      Pertinent Vitals/Pain       Hand Dominance Right   Extremity/Trunk Assessment Upper Extremity Assessment Upper Extremity Assessment: Overall WFL for tasks assessed   Lower Extremity Assessment Lower Extremity Assessment: Overall WFL for tasks assessed   Cervical / Trunk Assessment Cervical / Trunk Assessment: Normal   Communication Communication Communication: No difficulties   Cognition Arousal/Alertness: Awake/alert Behavior During Therapy: WFL for tasks assessed/performed Overall Cognitive Status: Within Functional Limits for tasks assessed                                     General Comments  Pt desats to 88% on 6L O2 standing at sink for grooming. Pt with pursed lip breathing and seated rest break increased to >91% O2 on 6L [place].    Exercises  Shoulder Instructions      Home Living Family/patient expects to be discharged to:: Private residence Living Arrangements: Alone Available Help at Discharge: Family;Available PRN/intermittently Type of Home: Apartment Home Access: Level entry           Bathroom Shower/Tub: Chief Strategy Officer: Standard     Home Equipment: Walker - 2 wheels          Prior Functioning/Environment Level of Independence: Independent        Comments: driving        OT Problem List: Decreased strength;Decreased activity tolerance;Impaired balance (sitting and/or standing)      OT Treatment/Interventions: Self-care/ADL training;Therapeutic exercise;Neuromuscular education;Energy conservation;DME and/or AE instruction;Therapeutic activities;Patient/family education;Balance training    OT Goals(Current goals can be found in the care plan section) Acute Rehab OT  Goals Patient Stated Goal: to go home OT Goal Formulation: With patient Time For Goal Achievement: 03/14/19 Potential to Achieve Goals: Good ADL Goals Pt Will Perform Tub/Shower Transfer: with modified independence;grab bars Pt/caregiver will Perform Home Exercise Program: Both right and left upper extremity;With Supervision;With written HEP provided Additional ADL Goal #1: Pt will state 3 ways to conserve energy while performing ADL and mobility.  OT Frequency: Min 2X/week   Barriers to D/C: Decreased caregiver support  "I have a sister that lives nearby."       Co-evaluation              AM-PAC OT "6 Clicks" Daily Activity     Outcome Measure Help from another person eating meals?: None Help from another person taking care of personal grooming?: None Help from another person toileting, which includes using toliet, bedpan, or urinal?: None Help from another person bathing (including washing, rinsing, drying)?: A Little Help from another person to put on and taking off regular upper body clothing?: None Help from another person to put on and taking off regular lower body clothing?: A Little 6 Click Score: 22   End of Session Equipment Utilized During Treatment: Gait belt;Rolling walker Nurse Communication: Mobility status  Activity Tolerance: Patient tolerated treatment well Patient left: in chair;with call bell/phone within reach;Other (comment)(P.T. coming in after OT session.)  OT Visit Diagnosis: Unsteadiness on feet (R26.81);Muscle weakness (generalized) (M62.81)                Time: 1610-9604 OT Time Calculation (min): 19 min Charges:  OT General Charges $OT Visit: 1 Visit OT Evaluation $OT Eval Moderate Complexity: 1 Mod  Cristi Loron) Glendell Docker OTR/L Acute Rehabilitation Services Pager: [phone] Office: [phone]   Lonzo Cloud 02/28/2019, 4:14 PM

## 2019-02-28 NOTE — Progress Notes (Signed)
SATURATION QUALIFICATIONS: (This note is used to comply with regulatory documentation for home oxygen)  Patient Saturations on Room Air at Rest =92  Patient Saturations on Room Air while Ambulating = 79  Patient Saturations on 2 Liters of oxygen to recover   Currently 94% on 2 L

## 2019-03-01 ENCOUNTER — Other Ambulatory Visit: Payer: Self-pay

## 2019-03-01 LAB — BASIC METABOLIC PANEL
Anion gap: 11 (ref 5–15)
BUN: 22 mg/dL (ref 8–23)
CO2: 28 mmol/L (ref 22–32)
Calcium: 8.4 mg/dL — ABNORMAL LOW (ref 8.9–10.3)
Chloride: 101 mmol/L (ref 98–111)
Creatinine, Ser: 1.02 mg/dL — ABNORMAL HIGH (ref 0.44–1.00)
GFR calc Af Amer: >60 mL/min (ref >60)
GFR calc non Af Amer: 58 mL/min — ABNORMAL LOW (ref >60)
Glucose, Bld: 181 mg/dL — ABNORMAL HIGH (ref 70–99)
Potassium: 4.2 mmol/L (ref 3.5–5.1)
Sodium: 140 mmol/L (ref 135–145)

## 2019-03-01 LAB — CBC
HCT: 57.5 % — ABNORMAL HIGH (ref 36.0–46.0)
Hemoglobin: 17.8 g/dL — ABNORMAL HIGH (ref 12.0–15.0)
MCH: 27.9 pg (ref 26.0–34.0)
MCHC: 31 g/dL (ref 30.0–36.0)
MCV: 90 fL (ref 80.0–100.0)
Platelets: 213 10*3/uL (ref 150–400)
RBC: 6.39 MIL/uL — ABNORMAL HIGH (ref 3.87–5.11)
RDW: 17.7 % — ABNORMAL HIGH (ref 11.5–15.5)
WBC: 5.1 10*3/uL (ref 4.0–10.5)
nRBC: 0 % (ref 0.0–0.2)

## 2019-03-01 MED ORDER — PNEUMOCOCCAL VAC POLYVALENT 25 MCG/0.5ML IJ INJ
0.5000 mL | INJECTION | INTRAMUSCULAR | Status: AC
  Administered 2019-03-02: 0.5 mL via INTRAMUSCULAR
  Filled 2019-03-01: qty 0.5

## 2019-03-01 MED ORDER — POTASSIUM CHLORIDE CRYS ER 20 MEQ PO TBCR
20.0000 meq | EXTENDED_RELEASE_TABLET | Freq: Every day | ORAL | Status: DC
  Administered 2019-03-01 – 2019-03-02 (×2): 20 meq via ORAL
  Filled 2019-03-01 (×2): qty 1

## 2019-03-01 NOTE — Progress Notes (Signed)
Occupational Therapy Treatment Patient Details Name: Shari Obrien MRN: [id number] DOB: September 03, 1956 Today's Date: 03/01/2019    History of present illness 63 y.o. female with medical history significant of COPD, ongoing smoker, history of diastolic heart failure and chronic leg edema, hypertension who is presenting to the emergency room with 1 day history of shortness of breath and leg swelling and edema.    OT comments  Pt performing tub transfer simulation with minguardA. Pt desats to mid 38s with ambulating ~100' on RA requiring 2 mins to recover >90% with pursed lip breathing. Pt able to state 3/3 energy conservation tasks to implement at home. Pt continues to do well and has met goals at this time. OT signing off.    Follow Up Recommendations  No OT follow up;Supervision - Intermittent    Equipment Recommendations  None recommended by OT    Recommendations for Other Services      Precautions / Restrictions Precautions Precautions: Fall;Other (comment) Precaution Comments: 2L O2 HFNC Restrictions Weight Bearing Restrictions: No       Mobility Bed Mobility Overal bed mobility: Modified Independent             General bed mobility comments: In chair at entry  Transfers Overall transfer level: Modified independent                    Balance Overall balance assessment: Mild deficits observed, not formally tested                                         ADL either performed or assessed with clinical judgement   ADL Overall ADL's : At baseline                                       General ADL Comments: Pt practicing tub transfer simulation and minguardA for task. O2 levels >88% on 2L O2.     Vision       Perception     Praxis      Cognition Arousal/Alertness: Awake/alert Behavior During Therapy: WFL for tasks assessed/performed Overall Cognitive Status: Within Functional Limits for tasks assessed                                           Exercises     Shoulder Instructions       General Comments Pt desats to mid 80s on RA requiring 2 mins to recover >90% with pursed lip breathing.    Pertinent Vitals/ Pain          Home Living                                          Prior Functioning/Environment              Frequency  Min 2X/week        Progress Toward Goals  OT Goals(current goals can now be found in the care plan section)  Progress towards OT goals: Progressing toward goals  Acute Rehab OT Goals Patient Stated Goal: to go home OT Goal Formulation: With patient Time For Goal  Achievement: 03/14/19 Potential to Achieve Goals: Good ADL Goals Pt Will Perform Tub/Shower Transfer: with modified independence;grab bars Pt/caregiver will Perform Home Exercise Program: Both right and left upper extremity;With Supervision;With written HEP provided Additional ADL Goal #1: Pt will state 3 ways to conserve energy while performing ADL and mobility.  Plan Discharge plan remains appropriate    Co-evaluation                 AM-PAC OT "6 Clicks" Daily Activity     Outcome Measure   Help from another person eating meals?: None Help from another person taking care of personal grooming?: None Help from another person toileting, which includes using toliet, bedpan, or urinal?: None Help from another person bathing (including washing, rinsing, drying)?: A Little Help from another person to put on and taking off regular upper body clothing?: None Help from another person to put on and taking off regular lower body clothing?: None 6 Click Score: 23    End of Session Equipment Utilized During Treatment: Gait belt;Rolling walker  OT Visit Diagnosis: Unsteadiness on feet (R26.81);Muscle weakness (generalized) (M62.81)   Activity Tolerance Patient tolerated treatment well   Patient Left in chair;with call bell/phone within reach   Nurse  Communication Mobility status        Time: 9047-5339 OT Time Calculation (min): 18 min  Charges: OT General Charges $OT Visit: 1 Visit OT Treatments $Self Care/Home Management : 8-22 mins  Darryl Nestle) Marsa Aris OTR/L Acute Rehabilitation Services Pager: [phone] Office: [phone]    Audie Pinto 03/01/2019, 4:59 PM

## 2019-03-01 NOTE — Progress Notes (Signed)
Physical Therapy Treatment Patient Details Name: Shari Obrien MRN: [id number] DOB: [birth year] Today's Date: 03/01/2019    History of Present Illness 63 y.o. female with medical history significant of COPD, ongoing smoker, history of diastolic heart failure and chronic leg edema, hypertension who is presenting to the emergency room with 1 day history of shortness of breath and leg swelling and edema.     PT Comments    Patient ambulating with independence no overt LOB, mild SOB with 250' despite de-satting to 75% on RA within first 20'. Pt remains in 70's during 250' of walking on RA. Patient insistent on not wearing supplemental O2. Discussed risk associated with low O2 saturation. Patient continues to inform staff she will not be wearing O2. "why do you all want to make me a handicap". recovers to above 90's with rest on 1.5L wall O2.    Follow Up Recommendations  No PT follow up     Equipment Recommendations  None recommended by PT    Recommendations for Other Services       Precautions / Restrictions Precautions Precautions: Fall;Other (comment) Precaution Comments: 2L O2 HFNC Restrictions Weight Bearing Restrictions: No    Mobility  Bed Mobility Overal bed mobility: Modified Independent             General bed mobility comments: In chair at entry  Transfers Overall transfer level: Modified independent                  Ambulation/Gait Ambulation/Gait assistance: Modified independent (Device/Increase time) Gait Distance (Feet): 250 Feet Assistive device: None Gait Pattern/deviations: WFL(Within Functional Limits) Gait velocity: decreased   General Gait Details: desat on RA to 68   Stairs             Wheelchair Mobility    Modified Rankin (Stroke Patients Only)       Balance Overall balance assessment: Mild deficits observed, not formally tested                                          Cognition Arousal/Alertness:  Awake/alert Behavior During Therapy: WFL for tasks assessed/performed Overall Cognitive Status: Within Functional Limits for tasks assessed                                        Exercises      General Comments        Pertinent Vitals/Pain      Home Living   Living Arrangements: Alone                  Prior Function            PT Goals (current goals can now be found in the care plan section) Acute Rehab PT Goals Patient Stated Goal: to go home PT Goal Formulation: With patient Potential to Achieve Goals: Good Progress towards PT goals: Progressing toward goals    Frequency    Min 3X/week      PT Plan Current plan remains appropriate    Co-evaluation              AM-PAC PT "6 Clicks" Mobility   Outcome Measure  Help needed turning from your back to your side while in a flat bed without using bedrails?: None Help needed moving from lying on  your back to sitting on the side of a flat bed without using bedrails?: None Help needed moving to and from a bed to a chair (including a wheelchair)?: A Little Help needed standing up from a chair using your arms (e.g., wheelchair or bedside chair)?: A Little Help needed to walk in hospital room?: A Little Help needed climbing 3-5 steps with a railing? : A Little 6 Click Score: 20    End of Session Equipment Utilized During Treatment: Gait belt;Oxygen Activity Tolerance: Patient tolerated treatment well Patient left: in chair Nurse Communication: Mobility status PT Visit Diagnosis: Unsteadiness on feet (R26.81)     Time: 1035-1100 PT Time Calculation (min) (ACUTE ONLY): 25 min  Charges:  $Gait Training: 23-37 mins                     Etta Grandchild, PT, DPT Acute Rehabilitation Services Pager: [phone] Office: [phone]     Etta Grandchild 03/01/2019, 11:01 AM

## 2019-03-01 NOTE — Progress Notes (Signed)
PROGRESS NOTE        PATIENT DETAILS Name: Shari Obrien Age: 63 y.o. Sex: female Date of Birth: 04-09-1956 Admit Date: 02/27/2019 Admitting Physician Dorcas Carrow, MD WUJ:[id number], Odette Horns, MD  Brief Narrative: Patient is a 63 y.o. female with history of COPD, chronic diastolic heart failure, tobacco use presented to the hospital with worsening shortness of breath, cough, and leg edema for 1 week.  Thought to have acute hypoxic respiratory failure due to a combination of COPD exacerbation and decompensated diastolic heart failure.  See below for further details  Subjective: She feels better-Down to 2 L of oxygen this morning-lungs are completely better.  Assessment/Plan: Acute hypoxic respiratory failure secondary to decompensated diastolic heart failure and COPD exacerbation: Improved-hardly any wheezing today-very good air entry-decreasing lower extremity edema.  Continue taper IV steroids-continue IV Lasix and bronchodilators.  Suspect that if clinical improvement continues-hopefully home tomorrow.  Her edema has improved-doubt reliability of weights.  Continue to follow  daily weights, electrolytes and intake/output.  Asked nursing staff to titrate off oxygen if tolerated.  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  HTN: Fluctuating-continue with lisinopril, amlodipine and IV Lasix.  Will follow and optimize accordingly   Tobacco abuse: Counseled  DVT Prophylaxis: Prophylactic Lovenox   Code Status: Full code   Family Communication: None at bedside  Disposition Plan: Remain inpatient  Antimicrobial agents: Anti-infectives (From admission, onward)   Start     Dose/Rate Route Frequency Ordered Stop   02/28/19 1330  azithromycin (ZITHROMAX) tablet 500 mg     500 mg Oral Daily 02/27/19 1320 03/04/19 0959   02/27/19 1320  azithromycin (ZITHROMAX) 500 mg in sodium chloride 0.9 % 250  mL IVPB     500 mg 250 mL/hr over 60 Minutes Intravenous Every 24 hours 02/27/19 1320 02/27/19 1745      Procedures: None  CONSULTS:  None  Time spent: 25- minutes-Greater than 50% of this time was spent in counseling, explanation of diagnosis, planning of further management, and coordination of care.  MEDICATIONS: Scheduled Meds:  amLODipine  10 mg Oral Daily   azithromycin  500 mg Oral Daily   budesonide (PULMICORT) nebulizer solution  0.25 mg Nebulization BID   enoxaparin (LOVENOX) injection  40 mg Subcutaneous Q24H   furosemide  40 mg Intravenous Q8H   guaiFENesin  600 mg Oral BID   ipratropium-albuterol  3 mL Nebulization Q6H   lisinopril  40 mg Oral Daily   methylPREDNISolone (SOLU-MEDROL) injection  60 mg Intravenous Q12H   nicotine  14 mg Transdermal Daily   potassium chloride  20 mEq Oral Daily   Continuous Infusions: PRN Meds:.albuterol, methocarbamol   PHYSICAL EXAM: Vital signs: Vitals:   03/01/19 0837 03/01/19 1016 03/01/19 1017 03/01/19 1018  BP: (!) 151/109 (!) 153/83    Pulse: 93 (!) 101 97 93  Resp: 20 20 20 20   Temp: 97.8 F (36.6 C)     TempSrc: Oral     SpO2: 95% 92% 91% 91%  Weight:       Filed Weights   02/27/19 0805 02/27/19 1256 03/01/19 0641  Weight: 104.6 kg 109.5 kg 110.7 kg   Body mass index is 39.39 kg/m.   General appearance:Awake, alert, not in any distress.  Eyes:no scleral icterus. HEENT: Atraumatic and Normocephalic Neck: supple, no JVD. Resp:Good air entry  bilaterally, only a few scattered rhonchi-much better than yesterday CVS: S1 S2 regular, no murmurs.  GI: Bowel sounds present, Non tender and not distended with no gaurding, rigidity or rebound. Extremities: B/L Lower Ext shows + edema, both legs are warm to touch Neurology:  Non focal Musculoskeletal:No digital cyanosis Skin:No Rash, warm and dry Wounds:N/A  I have personally reviewed following labs and imaging studies  LABORATORY DATA: CBC: Recent  Labs  Lab 02/27/19 0806 02/28/19 0256 03/01/19 0513  WBC 6.5 5.6 5.1  NEUTROABS 2.9  --   --   HGB 18.8* 17.4* 17.8*  HCT 59.3* 56.5* 57.5*  MCV 91.2 90.0 90.0  PLT 216 206 213    Basic Metabolic Panel: Recent Labs  Lab 02/27/19 0806 02/28/19 0256 03/01/19 0513  NA 141 138 140  K 3.6 3.7 4.2  CL 105 104 101  CO2 24 26 28   GLUCOSE 108* 162* 181*  BUN 16 21 22   CREATININE 0.86 1.00 1.02*  CALCIUM 8.8* 8.4* 8.4*    GFR: CrCl cannot be calculated (Unknown ideal weight.).  Liver Function Tests: No results for input(s): AST, ALT, ALKPHOS, BILITOT, PROT, ALBUMIN in the last 168 hours. No results for input(s): LIPASE, AMYLASE in the last 168 hours. No results for input(s): AMMONIA in the last 168 hours.  Coagulation Profile: No results for input(s): INR, PROTIME in the last 168 hours.  Cardiac Enzymes: No results for input(s): CKTOTAL, CKMB, CKMBINDEX, TROPONINI in the last 168 hours.  BNP (last 3 results) No results for input(s): PROBNP in the last 8760 hours.  HbA1C: No results for input(s): HGBA1C in the last 72 hours.  CBG: No results for input(s): GLUCAP in the last 168 hours.  Lipid Profile: No results for input(s): CHOL, HDL, LDLCALC, TRIG, CHOLHDL, LDLDIRECT in the last 72 hours.  Thyroid Function Tests: No results for input(s): TSH, T4TOTAL, FREET4, T3FREE, THYROIDAB in the last 72 hours.  Anemia Panel: No results for input(s): VITAMINB12, FOLATE, FERRITIN, TIBC, IRON, RETICCTPCT in the last 72 hours.  Urine analysis: No results found for: COLORURINE, APPEARANCEUR, LABSPEC, PHURINE, GLUCOSEU, HGBUR, BILIRUBINUR, KETONESUR, PROTEINUR, UROBILINOGEN, NITRITE, LEUKOCYTESUR  Sepsis Labs: Lactic Acid, Venous No results found for: LATICACIDVEN  MICROBIOLOGY: Recent Results (from the past 240 hour(s))  MRSA PCR Screening     Status: None   Collection Time: 02/27/19  1:24 PM  Result Value Ref Range Status   MRSA by PCR NEGATIVE NEGATIVE Final     Comment:        The GeneXpert MRSA Assay (FDA approved for NASAL specimens only), is one component of a comprehensive MRSA colonization surveillance program. It is not intended to diagnose MRSA infection nor to guide or monitor treatment for MRSA infections. Performed at [facility], [address] [place], [place] [address]     RADIOLOGY STUDIES/RESULTS: Dg Chest Portable 1 View  Result Date: 02/27/2019 CLINICAL DATA:  Shortness of breath for 2 days EXAM: PORTABLE CHEST 1 VIEW COMPARISON:  06/29/2016 FINDINGS: Cardiac shadow is enlarged. Aortic calcifications are noted. The lungs are well aerated bilaterally with small pleural effusions and mild central vascular congestion. Mild bibasilar atelectatic changes are noted. No bony abnormality is seen. IMPRESSION: Bibasilar atelectasis and small pleural effusions. Mild vascular congestion is noted. Electronically Signed   By: Alcide Clever M.D.   On: 02/27/2019 08:32   Vas [place] Lower Extremity Venous (dvt)  Result Date: 02/27/2019  Lower Venous Study Indications: Pain, and Swelling.  Performing Technologist: Chanda Busing RVT  Examination  Guidelines: A complete evaluation includes B-mode imaging, spectral Doppler, color Doppler, and power Doppler as needed of all accessible portions of each vessel. Bilateral testing is considered an integral part of a complete examination. Limited examinations for reoccurring indications may be performed as noted.  Right Venous Findings: +---------+---------------+---------+-----------+----------+-------+            Compressibility Phasicity Spontaneity Properties Summary  +---------+---------------+---------+-----------+----------+-------+  CFV       Full            Yes       Yes                             +---------+---------------+---------+-----------+----------+-------+  SFJ       Full                                                      +---------+---------------+---------+-----------+----------+-------+   FV Prox   Full                                                      +---------+---------------+---------+-----------+----------+-------+  FV Mid    Full                                                      +---------+---------------+---------+-----------+----------+-------+  FV Distal                 Yes       Yes                             +---------+---------------+---------+-----------+----------+-------+  PFV       Full                                                      +---------+---------------+---------+-----------+----------+-------+  POP       Full            Yes       Yes                             +---------+---------------+---------+-----------+----------+-------+  PTV       Full                                                      +---------+---------------+---------+-----------+----------+-------+  PERO      Full                                                      +---------+---------------+---------+-----------+----------+-------+  Left Venous Findings: +---------+---------------+---------+-----------+----------+-------+            Compressibility Phasicity Spontaneity Properties Summary  +---------+---------------+---------+-----------+----------+-------+  CFV       Full            Yes       Yes                             +---------+---------------+---------+-----------+----------+-------+  SFJ       Full                                                      +---------+---------------+---------+-----------+----------+-------+  FV Prox   Full                                                      +---------+---------------+---------+-----------+----------+-------+  FV Mid    Full                                                      +---------+---------------+---------+-----------+----------+-------+  FV Distal Full                                                      +---------+---------------+---------+-----------+----------+-------+  PFV       Full                                                       +---------+---------------+---------+-----------+----------+-------+  POP       Full            Yes       Yes                             +---------+---------------+---------+-----------+----------+-------+  PTV       Full                                                      +---------+---------------+---------+-----------+----------+-------+  PERO      Full                                                      +---------+---------------+---------+-----------+----------+-------+    Summary: Right: There is no evidence of deep vein thrombosis in the lower extremity. No cystic structure found in the popliteal fossa. Left: There is no evidence of deep vein  thrombosis in the lower extremity. No cystic structure found in the popliteal fossa.  *See table(s) above for measurements and observations. Electronically signed by Waverly Ferrari MD on 02/27/2019 at 5:18:02 PM.    Final      LOS: 2 days   Jeoffrey Massed, MD  [facility]  If 7PM-7AM, please contact night-coverage  Please page via [email or web address]  Go to [email or web address] and use [place]'s universal password to access. If you do not have the password, please contact the hospital operator.  Locate the [facility] provider you are looking for under [facility] and page to a number that you can be directly reached. If you still have difficulty reaching the provider, please page the [facility] (Director on Call) for the Hospitalists listed on amion for assistance.  03/01/2019, 11:02 AM

## 2019-03-01 NOTE — Progress Notes (Signed)
Physical Therapy Treatment Patient Details Name: MARKELLE ZARAGOZA MRN: [id number] DOB: 06-02-1956 Today's Date: 03/01/2019    History of Present Illness 63 y.o. female with medical history significant of COPD, ongoing smoker, history of diastolic heart failure and chronic leg edema, hypertension who is presenting to the emergency room with 1 day history of shortness of breath and leg swelling and edema.     PT Comments    Patient seen again to progress gait and re-assess O2 demands. Pt adamant not to wear O2, desats to 79% within 50' of walking, remains in mid to high 70s during walk. Attempted to instruct patient of I.S. use, she states "why don't you do it". eventually able to demonstrate proper technique.     Follow Up Recommendations  No PT follow up     Equipment Recommendations  None recommended by PT    Recommendations for Other Services       Precautions / Restrictions Precautions Precautions: Fall;Other (comment) Precaution Comments: 2L O2 HFNC Restrictions Weight Bearing Restrictions: No    Mobility  Bed Mobility Overal bed mobility: Modified Independent             General bed mobility comments: In chair at entry  Transfers Overall transfer level: Modified independent                  Ambulation/Gait Ambulation/Gait assistance: Modified independent (Device/Increase time) Gait Distance (Feet): 250 Feet Assistive device: None Gait Pattern/deviations: WFL(Within Functional Limits) Gait velocity: decreased   General Gait Details: desat on RA to 79% while walking this visit.    Stairs             Wheelchair Mobility    Modified Rankin (Stroke Patients Only)       Balance Overall balance assessment: Mild deficits observed, not formally tested                                          Cognition Arousal/Alertness: Awake/alert Behavior During Therapy: WFL for tasks assessed/performed Overall Cognitive Status:  Within Functional Limits for tasks assessed                                        Exercises      General Comments  pt argumentative during session about O2, IS use, and returning to bed vs sitting EOB w/ out chair alarm. Discussed with RN.       Pertinent Vitals/Pain      Home Living                      Prior Function            PT Goals (current goals can now be found in the care plan section) Acute Rehab PT Goals Patient Stated Goal: to go home PT Goal Formulation: With patient Potential to Achieve Goals: Good Progress towards PT goals: Progressing toward goals    Frequency    Min 3X/week      PT Plan Current plan remains appropriate    Co-evaluation              AM-PAC PT "6 Clicks" Mobility   Outcome Measure  Help needed turning from your back to your side while in a flat bed without using bedrails?: None Help needed moving  from lying on your back to sitting on the side of a flat bed without using bedrails?: None Help needed moving to and from a bed to a chair (including a wheelchair)?: A Little Help needed standing up from a chair using your arms (e.g., wheelchair or bedside chair)?: A Little Help needed to walk in hospital room?: A Little Help needed climbing 3-5 steps with a railing? : A Little 6 Click Score: 20    End of Session Equipment Utilized During Treatment: Gait belt;Oxygen Activity Tolerance: Patient tolerated treatment well Patient left: in chair Nurse Communication: Mobility status PT Visit Diagnosis: Unsteadiness on feet (R26.81)     Time: 1540-1600 PT Time Calculation (min) (ACUTE ONLY): 20 min  Charges:  $Gait Training: 8-22 mins                     Etta Grandchild, PT, DPT Acute Rehabilitation Services Pager: [phone] Office: [phone]     Etta Grandchild 03/01/2019, 4:07 PM

## 2019-03-01 NOTE — Plan of Care (Signed)
  Problem: Education: Goal: Knowledge of General Education information will improve Description: Including pain rating scale, medication(s)/side effects and non-pharmacologic comfort measures Outcome: Progressing   Problem: Health Behavior/Discharge Planning: Goal: Ability to manage health-related needs will improve Outcome: Progressing   Problem: Clinical Measurements: Goal: Ability to maintain clinical measurements within normal limits will improve Outcome: Progressing Goal: Will remain free from infection Outcome: Progressing Goal: Diagnostic test results will improve Outcome: Progressing Goal: Respiratory complications will improve Outcome: Progressing Goal: Cardiovascular complication will be avoided Outcome: Progressing   Problem: Activity: Goal: Risk for activity intolerance will decrease Outcome: Progressing   Problem: Nutrition: Goal: Adequate nutrition will be maintained Outcome: Progressing   Problem: Coping: Goal: Level of anxiety will decrease Outcome: Progressing   Problem: Elimination: Goal: Will not experience complications related to bowel motility Outcome: Progressing   Problem: Pain Managment: Goal: General experience of comfort will improve Outcome: Progressing   Problem: Safety: Goal: Ability to remain free from injury will improve Outcome: Progressing   

## 2019-03-02 DIAGNOSIS — I5032 Chronic diastolic (congestive) heart failure: Secondary | ICD-10-CM

## 2019-03-02 LAB — BASIC METABOLIC PANEL
Anion gap: 12 (ref 5–15)
BUN: 22 mg/dL (ref 8–23)
CO2: 27 mmol/L (ref 22–32)
Calcium: 8.6 mg/dL — ABNORMAL LOW (ref 8.9–10.3)
Chloride: 99 mmol/L (ref 98–111)
Creatinine, Ser: 0.9 mg/dL (ref 0.44–1.00)
GFR calc Af Amer: >60 mL/min (ref >60)
GFR calc non Af Amer: >60 mL/min (ref >60)
Glucose, Bld: 172 mg/dL — ABNORMAL HIGH (ref 70–99)
Potassium: 4.2 mmol/L (ref 3.5–5.1)
Sodium: 138 mmol/L (ref 135–145)

## 2019-03-02 MED ORDER — LISINOPRIL 40 MG PO TABS: 40.0000 mg | ORAL_TABLET | Freq: Every day | ORAL | 0 refills | Status: DC

## 2019-03-02 MED ORDER — POTASSIUM CHLORIDE CRYS ER 20 MEQ PO TBCR: 20.0000 meq | EXTENDED_RELEASE_TABLET | Freq: Every day | ORAL | 0 refills | Status: DC

## 2019-03-02 MED ORDER — FLUTICASONE-SALMETEROL 100-50 MCG/DOSE IN AEPB: 1.0000 | INHALATION_SPRAY | Freq: Two times a day (BID) | RESPIRATORY_TRACT | 0 refills | Status: DC

## 2019-03-02 MED ORDER — FUROSEMIDE 40 MG PO TABS: 40.0000 mg | ORAL_TABLET | Freq: Every day | ORAL | 0 refills | Status: DC

## 2019-03-02 MED ORDER — AMLODIPINE BESYLATE 10 MG PO TABS: 10.0000 mg | ORAL_TABLET | Freq: Every day | ORAL | 0 refills | Status: DC

## 2019-03-02 MED ORDER — PREDNISONE 10 MG PO TABS: ORAL_TABLET | ORAL | 0 refills | Status: DC

## 2019-03-02 MED ORDER — IPRATROPIUM-ALBUTEROL 0.5-2.5 (3) MG/3ML IN SOLN: 3.0000 mL | Freq: Four times a day (QID) | RESPIRATORY_TRACT | 0 refills | Status: DC | PRN

## 2019-03-02 MED ORDER — ALBUTEROL SULFATE HFA 108 (90 BASE) MCG/ACT IN AERS: 2.0000 | INHALATION_SPRAY | RESPIRATORY_TRACT | 3 refills | Status: DC | PRN

## 2019-03-02 MED ORDER — CETIRIZINE HCL 10 MG PO TABS: 10.0000 mg | ORAL_TABLET | Freq: Every day | ORAL | 0 refills | Status: DC | PRN

## 2019-03-02 MED ORDER — TIOTROPIUM BROMIDE MONOHYDRATE 18 MCG IN CAPS: 18.0000 ug | ORAL_CAPSULE | Freq: Every day | RESPIRATORY_TRACT | 0 refills | Status: DC

## 2019-03-02 MED FILL — ADVAIR 100/50 DISKUS: 100-50 | 30 days supply | Qty: 60 | Fill #0

## 2019-03-02 MED FILL — FUROSEMIDE 40 MG TABLET: 40 | 30 days supply | Qty: 30 | Fill #0

## 2019-03-02 MED FILL — predniSONE 10 MG TABS: 10 | 7 days supply | Qty: 19 | Fill #0

## 2019-03-02 MED FILL — SPIRIVA 18 MCG CP-HANDIHALE: 18 | 30 days supply | Qty: 30 | Fill #0

## 2019-03-02 MED FILL — AMLODIPINE BESYLATE 10 MG T: 10 | 30 days supply | Qty: 30 | Fill #0

## 2019-03-02 MED FILL — POTASSIUM CL ER 20 MEQ TABL: 20 | 30 days supply | Qty: 30 | Fill #0

## 2019-03-02 MED FILL — LISINOPRIL 10 MG TABS: 10 | 30 days supply | Qty: 30 | Fill #0

## 2019-03-02 MED FILL — IPRAT-ALBUT 0.5-3(2.5) MG/3: 0.5-2.5 (3) | 30 days supply | Qty: 360 | Fill #0

## 2019-03-02 MED FILL — CETIRIZINE HCL 10 MG TABS: 10 | 30 days supply | Qty: 30 | Fill #0

## 2019-03-02 MED FILL — PROVENTIL HFA 108 (90 BASE): 108 (90 BAS | 17 days supply | Qty: 7 | Fill #0

## 2019-03-02 NOTE — TOC Transition Note (Signed)
Transition of Care [facility]) - CM/SW Discharge Note   Patient Details  Name: Shari Obrien MRN: [id number] Date of Birth: 02/21/1956  [facility] CM/SW Contact:  Epifanio Lesches, RN Phone Number: 03/02/2019, 10:17 AM   Clinical Narrative:    Transition to home. Pt approved for oxygen/nebulizer (charity case), referral made with [employer]. Portable oxygen tank and nebulizer will be delivered to bedside prior to discharge.  Match Letter given to assist  with Medication cost, pt without insurance, hasn't been working. States limited income. Rx meds faxed to [phone]. Pt to pick up Rx meds from [facility], NCM made pt aware.  Pt states sister to provide transportation to home.       Barriers to Discharge: Barriers Resolved   Patient Goals and CMS Choice        Discharge Placement                       Discharge Plan and Services In-house Referral: NA Discharge Planning Services: CM Consult, [facility] Post Acute Care Choice: Durable Medical Equipment(oxygen,nebulizer)          DME Arranged: Oxygen, Nebulizer machine DME Agency: [facility] HH Arranged: NA HH Agency: NA   Social Determinants of Health (SDOH) Interventions     Readmission Risk Interventions No flowsheet data found.

## 2019-03-02 NOTE — Progress Notes (Addendum)
SATURATION QUALIFICATIONS: (This note is used to comply with regulatory documentation for home oxygen)  Patient Saturations on Room Air at Rest =92%  Patient Saturations on Room Air while Ambulating = 79% ,  Patient Saturations on 2 Liters of oxygen to recover  Currently 94% on 2 L pt oxygen saturation at 94% on 2L of N/C  Ambulating down the hall.

## 2019-03-02 NOTE — Progress Notes (Signed)
[name] to be D/C'd home, per MD order.  Discussed with the patient and all questions fully answered.  VSS, Skin clean, dry and intact without evidence of skin break down, no evidence of skin tears noted. IV catheter discontinued intact. Site without signs and symptoms of complications. Dressing and pressure applied.  An After Visit Summary was printed and given to the patient. Patient received prescription.  D/c education completed with patient/family including follow up instructions, medication list, d/c activities limitations if indicated, with other d/c instructions as indicated by MD - patient able to verbalize understanding, all questions fully answered.   Patient instructed to return to ED, call 911, or call MD for any changes in condition.   Patient escorted via WC, and D/C home via private auto.   Conley Canal [facility] 03/02/2019 11:46 AM

## 2019-03-02 NOTE — Discharge Summary (Addendum)
PATIENT DETAILS Name: Shari Obrien Age: 63 y.o. Sex: female Date of Birth: 1956-08-16 MRN: [id number]. Admitting Physician: Dorcas Carrow, MD WUJ:[id number], Odette Horns, MD  Admit Date: 02/27/2019 Discharge date: 03/02/2019  Recommendations for Outpatient Follow-up:  1. Follow up with PCP in 1-2 weeks 2. Please obtain BMP/CBC in one week 3. Refer to pulmonology for outpatient PFTs 4. Being discharged on home O2  Admitted From:  Home  Disposition: Home   Home Health: No  Equipment/Devices: Oxygen 2L  Discharge Condition: Stable  CODE STATUS: FULL CODE  Diet recommendation:  Heart Healthy   Brief Summary: See H&P, Labs, Consult and Test reports for all details in brief, Patient is a 63 y.o. female with history of COPD, chronic diastolic heart failure, tobacco use presented to the hospital with worsening shortness of breath, cough, and leg edema for 1 week.  Thought to have acute hypoxic respiratory failure due to a combination of COPD exacerbation and decompensated diastolic heart failure (ran out of Lasix 2 weeks prior to admission).  See below for further details  Brief Hospital Course: Acute hypoxic respiratory failure secondary to decompensated diastolic heart failure and COPD exacerbation:  Improved-very good air entry this morning-only a few scattered rhonchi.  Treated with IV Lasix, IV steroids and bronchodilators.  Much improved-we will transition to oral Lasix (claims ran out of it 2 weeks back), tapering prednisone and bronchodilators.  Lower extremity edema has markedly improved with IV Lasix.  Lower extremity Dopplers were negative for DVT.  She will likely qualify for home O2 (PT note on 4/9)-she has been encouraged to stop smoking, she has been encouraged to make an appointment with pulmonology for formal PFTs.  Note-patient is not very keen on going home on O2-she is not sure if she can use it 24/7-due to work constraints-claims that although her oxygen saturation  desaturates with ambulation-she does not "feel it" and thinks that she is back to usual baseline.  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  HTN:  2 days-we will plan on continuing lisinopril, amlodipine and Lasix on discharge.  Further optimization will need to be done by her primary care practitioner.    Tobacco abuse: Counseled-not sure if she has any intention of quitting.  Procedures/Studies: None  Discharge Diagnoses:  Principal Problem:   Respiratory failure with hypoxia (HCC) Active Problems:   Essential hypertension   Diastolic heart failure (HCC)   Current smoker   Lower leg edema   Polycythemia   Discharge Instructions:  Activity:  As tolerated   Discharge Instructions    (HEART FAILURE PATIENTS) Call MD:  Anytime you have any of the following symptoms: 1) 3 pound weight gain in 24 hours or 5 pounds in 1 week 2) shortness of breath, with or without a dry hacking cough 3) swelling in the hands, feet or stomach 4) if you have to sleep on extra pillows at night in order to breathe.   Complete by:  As directed    Diet - low sodium heart healthy   Complete by:  As directed    Discharge instructions   Complete by:  As directed    Follow with Primary MD  Hoy Register, MD in 1 week  Stop smoking  Use of home O2 at all times  Please get a complete blood count and chemistry panel checked by your Primary MD at your next visit, and again as instructed by your Primary MD.  Get Medicines reviewed and  adjusted: Please take all your medications with you for your next visit with your Primary MD  Laboratory/radiological data: Please request your Primary MD to go over all hospital tests and procedure/radiological results at the follow up, please ask your Primary MD to get all Hospital records sent to his/her office.  In some cases, they will be blood work, cultures and biopsy results pending at the time  of your discharge. Please request that your primary care M.D. follows up on these results.  Also Note the following: If you experience worsening of your admission symptoms, develop shortness of breath, life threatening emergency, suicidal or homicidal thoughts you must seek medical attention immediately by calling 911 or calling your MD immediately  if symptoms less severe.  You must read complete instructions/literature along with all the possible adverse reactions/side effects for all the Medicines you take and that have been prescribed to you. Take any new Medicines after you have completely understood and accpet all the possible adverse reactions/side effects.   Do not drive when taking Pain medications or sleeping medications (Benzodaizepines)  Do not take more than prescribed Pain, Sleep and Anxiety Medications. It is not advisable to combine anxiety,sleep and pain medications without talking with your primary care practitioner  Special Instructions: If you have smoked or chewed Tobacco  in the last 2 yrs please stop smoking, stop any regular Alcohol  and or any Recreational drug use.  Wear Seat belts while driving.  Please note: You were cared for by a hospitalist during your hospital stay. Once you are discharged, your primary care physician will handle any further medical issues. Please note that NO REFILLS for any discharge medications will be authorized once you are discharged, as it is imperative that you return to your primary care physician (or establish a relationship with a primary care physician if you do not have one) for your post hospital discharge needs so that they can reassess your need for medications and monitor your lab values.   Increase activity slowly   Complete by:  As directed      Allergies as of 03/02/2019      Reactions   Periactin [cyproheptadine] Swelling      Medication List    STOP taking these medications   diclofenac sodium 1 % Gel Commonly known  as:  Voltaren   methocarbamol 500 MG tablet Commonly known as:  ROBAXIN     TAKE these medications   albuterol 108 (90 Base) MCG/ACT inhaler Commonly known as:  Ventolin HFA Inhale 2 puffs into the lungs every 4 (four) hours as needed for wheezing or shortness of breath. What changed:  when to take this   amLODipine 10 MG tablet Commonly known as:  NORVASC Take 1 tablet (10 mg total) by mouth daily.   BAYER BACK & BODY PAIN EX ST PO Take 2 tablets by mouth as needed (back pain).   cetirizine 10 MG tablet Commonly known as:  ZYRTEC Take 1 tablet (10 mg total) by mouth daily as needed for allergies.   Fluticasone-Salmeterol 100-50 MCG/DOSE Aepb Commonly known as:  Advair Diskus Inhale 1 puff into the lungs 2 (two) times daily.   furosemide 40 MG tablet Commonly known as:  LASIX Take 1 tablet (40 mg total) by mouth daily. As needed for pedal edema. MUST MAKE APPT FOR FURTHER REFILLS   ipratropium-albuterol 0.5-2.5 (3) MG/3ML Soln Commonly known as:  DUONEB Take 3 mLs by nebulization every 6 (six) hours as needed.   lisinopril 40 MG  tablet Commonly known as:  PRINIVIL,ZESTRIL Take 1 tablet (40 mg total) by mouth daily.   potassium chloride SA 20 MEQ tablet Commonly known as:  K-DUR,KLOR-CON Take 1 tablet (20 mEq total) by mouth daily. Start taking on:  March 03, 2019   predniSONE 10 MG tablet Commonly known as:  DELTASONE Take 4 tablets (40 mg) daily for 2 days, then, Take 3 tablets (30 mg) daily for 2 days, then, Take 2 tablets (20 mg) daily for 2 days, then, Take 1 tablets (10 mg) daily for 1 days, then stop   tiotropium 18 MCG inhalation capsule Commonly known as:  Spiriva HandiHaler Place 1 capsule (18 mcg total) into inhaler and inhale daily.   triamcinolone 0.025 % ointment Commonly known as:  KENALOG APPLY 1 APPLICATION TOPICALLY 2 TIMES DAILY. What changed:    how much to take  how to take this  when to take this  reasons to take this              Durable Medical Equipment  (From admission, onward)         Start     Ordered   03/02/19 0856  For home use only DME oxygen  Once    Question Answer Comment  Mode or (Route) Nasal cannula   Liters per Minute 2   Frequency Continuous (stationary and portable oxygen unit needed)   Oxygen conserving device Yes   Oxygen delivery system Gas      03/02/19 0855   03/02/19 0856  For home use only DME Nebulizer machine  Once    Question:  Patient needs a nebulizer to treat with the following condition  Answer:  COPD (chronic obstructive pulmonary disease) (HCC)   03/02/19 0855         Follow-up Information    Hoy Register, MD. Schedule an appointment as soon as possible for a visit in 1 week(s).   Specialty:  Family Medicine Contact information: [address] [place] [address] [phone]          Allergies  Allergen Reactions   Periactin [Cyproheptadine] Swelling    Consultations:   None   Other Procedures/Studies: Dg Chest Portable 1 View  Result Date: 02/27/2019 CLINICAL DATA:  Shortness of breath for 2 days EXAM: PORTABLE CHEST 1 VIEW COMPARISON:  06/29/2016 FINDINGS: Cardiac shadow is enlarged. Aortic calcifications are noted. The lungs are well aerated bilaterally with small pleural effusions and mild central vascular congestion. Mild bibasilar atelectatic changes are noted. No bony abnormality is seen. IMPRESSION: Bibasilar atelectasis and small pleural effusions. Mild vascular congestion is noted. Electronically Signed   By: Alcide Clever M.D.   On: 02/27/2019 08:32   Vas [place] Lower Extremity Venous (dvt)  Result Date: 02/27/2019  Lower Venous Study Indications: Pain, and Swelling.  Performing Technologist: Chanda Busing RVT  Examination Guidelines: A complete evaluation includes B-mode imaging, spectral Doppler, color Doppler, and power Doppler as needed of all accessible portions of each vessel. Bilateral testing is considered an integral part  of a complete examination. Limited examinations for reoccurring indications may be performed as noted.  Right Venous Findings: +---------+---------------+---------+-----------+----------+-------+            Compressibility Phasicity Spontaneity Properties Summary  +---------+---------------+---------+-----------+----------+-------+  CFV       Full            Yes       Yes                             +---------+---------------+---------+-----------+----------+-------+  SFJ       Full                                                      +---------+---------------+---------+-----------+----------+-------+  FV Prox   Full                                                      +---------+---------------+---------+-----------+----------+-------+  FV Mid    Full                                                      +---------+---------------+---------+-----------+----------+-------+  FV Distal                 Yes       Yes                             +---------+---------------+---------+-----------+----------+-------+  PFV       Full                                                      +---------+---------------+---------+-----------+----------+-------+  POP       Full            Yes       Yes                             +---------+---------------+---------+-----------+----------+-------+  PTV       Full                                                      +---------+---------------+---------+-----------+----------+-------+  PERO      Full                                                      +---------+---------------+---------+-----------+----------+-------+  Left Venous Findings: +---------+---------------+---------+-----------+----------+-------+            Compressibility Phasicity Spontaneity Properties Summary  +---------+---------------+---------+-----------+----------+-------+  CFV       Full            Yes       Yes                             +---------+---------------+---------+-----------+----------+-------+  SFJ        Full                                                      +---------+---------------+---------+-----------+----------+-------+  FV Prox   Full                                                      +---------+---------------+---------+-----------+----------+-------+  FV Mid    Full                                                      +---------+---------------+---------+-----------+----------+-------+  FV Distal Full                                                      +---------+---------------+---------+-----------+----------+-------+  PFV       Full                                                      +---------+---------------+---------+-----------+----------+-------+  POP       Full            Yes       Yes                             +---------+---------------+---------+-----------+----------+-------+  PTV       Full                                                      +---------+---------------+---------+-----------+----------+-------+  PERO      Full                                                      +---------+---------------+---------+-----------+----------+-------+    Summary: Right: There is no evidence of deep vein thrombosis in the lower extremity. No cystic structure found in the popliteal fossa. Left: There is no evidence of deep vein thrombosis in the lower extremity. No cystic structure found in the popliteal fossa.  *See table(s) above for measurements and observations. Electronically signed by Waverly Ferrari MD on 02/27/2019 at 5:18:02 PM.    Final      TODAY-DAY OF DISCHARGE:  Subjective:   Shari Obrien today has no headache,no chest abdominal pain,no new weakness tingling or numbness, feels much better wants to go home today.   Objective:   Blood pressure (!) 161/94, pulse 88, temperature 98.9 F (37.2 C), temperature source Oral, resp. rate 20, height 5\' 6"  (1.676 m), weight 107.9 kg, SpO2 92 %.  Intake/Output Summary (Last 24 hours) at 03/02/2019 0925 Last data filed  at 03/01/2019 1844 Gross per 24 hour  Intake 680 ml  Output --  Net 680 ml   Filed Weights   02/27/19 1256 03/01/19 0641  03/02/19 0440  Weight: 109.5 kg 110.7 kg 107.9 kg    Exam: Awake Alert, Oriented *3, No new F.N deficits, Normal affect [place].AT,PERRAL Supple Neck,No JVD, No cervical lymphadenopathy appriciated.  Symmetrical Chest wall movement, Good air movement bilaterally, CTAB RRR,No Gallops,Rubs or new Murmurs, No Parasternal Heave +ve B.Sounds, Abd Soft, Non tender, No organomegaly appriciated, No rebound -guarding or rigidity. No Cyanosis, Clubbing or edema, No new Rash or bruise   PERTINENT RADIOLOGIC STUDIES: Dg Chest Portable 1 View  Result Date: 02/27/2019 CLINICAL DATA:  Shortness of breath for 2 days EXAM: PORTABLE CHEST 1 VIEW COMPARISON:  06/29/2016 FINDINGS: Cardiac shadow is enlarged. Aortic calcifications are noted. The lungs are well aerated bilaterally with small pleural effusions and mild central vascular congestion. Mild bibasilar atelectatic changes are noted. No bony abnormality is seen. IMPRESSION: Bibasilar atelectasis and small pleural effusions. Mild vascular congestion is noted. Electronically Signed   By: Alcide CleverMark  Lukens M.D.   On: 02/27/2019 08:32   Vas [place] Lower Extremity Venous (dvt)  Result Date: 02/27/2019  Lower Venous Study Indications: Pain, and Swelling.  Performing Technologist: Chanda BusingGregory Collins RVT  Examination Guidelines: A complete evaluation includes B-mode imaging, spectral Doppler, color Doppler, and power Doppler as needed of all accessible portions of each vessel. Bilateral testing is considered an integral part of a complete examination. Limited examinations for reoccurring indications may be performed as noted.  Right Venous Findings: +---------+---------------+---------+-----------+----------+-------+            Compressibility Phasicity Spontaneity Properties Summary  +---------+---------------+---------+-----------+----------+-------+  CFV        Full            Yes       Yes                             +---------+---------------+---------+-----------+----------+-------+  SFJ       Full                                                      +---------+---------------+---------+-----------+----------+-------+  FV Prox   Full                                                      +---------+---------------+---------+-----------+----------+-------+  FV Mid    Full                                                      +---------+---------------+---------+-----------+----------+-------+  FV Distal                 Yes       Yes                             +---------+---------------+---------+-----------+----------+-------+  PFV       Full                                                      +---------+---------------+---------+-----------+----------+-------+  POP       Full            Yes       Yes                             +---------+---------------+---------+-----------+----------+-------+  PTV       Full                                                      +---------+---------------+---------+-----------+----------+-------+  PERO      Full                                                      +---------+---------------+---------+-----------+----------+-------+  Left Venous Findings: +---------+---------------+---------+-----------+----------+-------+            Compressibility Phasicity Spontaneity Properties Summary  +---------+---------------+---------+-----------+----------+-------+  CFV       Full            Yes       Yes                             +---------+---------------+---------+-----------+----------+-------+  SFJ       Full                                                      +---------+---------------+---------+-----------+----------+-------+  FV Prox   Full                                                      +---------+---------------+---------+-----------+----------+-------+  FV Mid    Full                                                       +---------+---------------+---------+-----------+----------+-------+  FV Distal Full                                                      +---------+---------------+---------+-----------+----------+-------+  PFV       Full                                                      +---------+---------------+---------+-----------+----------+-------+  POP       Full            Yes       Yes                             +---------+---------------+---------+-----------+----------+-------+  PTV       Full                                                      +---------+---------------+---------+-----------+----------+-------+  PERO      Full                                                      +---------+---------------+---------+-----------+----------+-------+    Summary: Right: There is no evidence of deep vein thrombosis in the lower extremity. No cystic structure found in the popliteal fossa. Left: There is no evidence of deep vein thrombosis in the lower extremity. No cystic structure found in the popliteal fossa.  *See table(s) above for measurements and observations. Electronically signed by Waverly Ferrari MD on 02/27/2019 at 5:18:02 PM.    Final      PERTINENT LAB RESULTS: CBC: Recent Labs    02/28/19 0256 03/01/19 0513  WBC 5.6 5.1  HGB 17.4* 17.8*  HCT 56.5* 57.5*  PLT 206 213   CMET CMP     Component Value Date/Time   NA 138 03/02/2019 0205   NA 145 (H) 07/04/2018 1229   K 4.2 03/02/2019 0205   CL 99 03/02/2019 0205   CO2 27 03/02/2019 0205   GLUCOSE 172 (H) 03/02/2019 0205   BUN 22 03/02/2019 0205   BUN 23 07/04/2018 1229   CREATININE 0.90 03/02/2019 0205   CREATININE 0.84 01/19/2016 1115   CALCIUM 8.6 (L) 03/02/2019 0205   PROT 7.5 07/04/2018 1229   ALBUMIN 4.3 07/04/2018 1229   AST 27 07/04/2018 1229   ALT 18 07/04/2018 1229   ALKPHOS 73 07/04/2018 1229   BILITOT 0.5 07/04/2018 1229   GFRNONAA >60 03/02/2019 0205   GFRNONAA 76 01/19/2016 1115   GFRAA >60 03/02/2019 0205    GFRAA 87 01/19/2016 1115    GFR Estimated Creatinine Clearance: 79.5 mL/min (by C-G formula based on SCr of 0.9 mg/dL). No results for input(s): LIPASE, AMYLASE in the last 72 hours. No results for input(s): CKTOTAL, CKMB, CKMBINDEX, TROPONINI in the last 72 hours. Invalid input(s): POCBNP No results for input(s): DDIMER in the last 72 hours. No results for input(s): HGBA1C in the last 72 hours. No results for input(s): CHOL, HDL, LDLCALC, TRIG, CHOLHDL, LDLDIRECT in the last 72 hours. No results for input(s): TSH, T4TOTAL, T3FREE, THYROIDAB in the last 72 hours.  Invalid input(s): FREET3 No results for input(s): VITAMINB12, FOLATE, FERRITIN, TIBC, IRON, RETICCTPCT in the last 72 hours. Coags: No results for input(s): INR in the last 72 hours.  Invalid input(s): PT Microbiology: Recent Results (from the past 240 hour(s))  MRSA PCR Screening     Status: None   Collection Time: 02/27/19  1:24 PM  Result Value Ref Range Status   MRSA by PCR NEGATIVE NEGATIVE Final    Comment:        The GeneXpert MRSA Assay (FDA approved for NASAL specimens only), is one component of a comprehensive MRSA colonization surveillance program. It is not intended to diagnose MRSA infection nor to guide or monitor treatment for MRSA infections. Performed at [facility], [address], [place], [place] [address]  FURTHER DISCHARGE INSTRUCTIONS:  Get Medicines reviewed and adjusted: Please take all your medications with you for your next visit with your Primary MD  Laboratory/radiological data: Please request your Primary MD to go over all hospital tests and procedure/radiological results at the follow up, please ask your Primary MD to get all Hospital records sent to his/her office.  In some cases, they will be blood work, cultures and biopsy results pending at the time of your discharge. Please request that your primary care M.D. goes through all the records of your hospital data and  follows up on these results.  Also Note the following: If you experience worsening of your admission symptoms, develop shortness of breath, life threatening emergency, suicidal or homicidal thoughts you must seek medical attention immediately by calling 911 or calling your MD immediately  if symptoms less severe.  You must read complete instructions/literature along with all the possible adverse reactions/side effects for all the Medicines you take and that have been prescribed to you. Take any new Medicines after you have completely understood and accpet all the possible adverse reactions/side effects.   Do not drive when taking Pain medications or sleeping medications (Benzodaizepines)  Do not take more than prescribed Pain, Sleep and Anxiety Medications. It is not advisable to combine anxiety,sleep and pain medications without talking with your primary care practitioner  Special Instructions: If you have smoked or chewed Tobacco  in the last 2 yrs please stop smoking, stop any regular Alcohol  and or any Recreational drug use.  Wear Seat belts while driving.  Please note: You were cared for by a hospitalist during your hospital stay. Once you are discharged, your primary care physician will handle any further medical issues. Please note that NO REFILLS for any discharge medications will be authorized once you are discharged, as it is imperative that you return to your primary care physician (or establish a relationship with a primary care physician if you do not have one) for your post hospital discharge needs so that they can reassess your need for medications and monitor your lab values.  Total Time spent coordinating discharge including counseling, education and face to face time equals 35 minutes.  SignedJeoffrey Massed 03/02/2019 9:25 AM

## 2019-03-06 ENCOUNTER — Telehealth: Payer: Self-pay | Admitting: Family Medicine

## 2019-03-06 NOTE — Telephone Encounter (Signed)
Patient called stating she would like a letter stating she is allowed to go back to work. Patient states she needs the letter before her appointment because otherwise she will be let go. Patient states she would like the letter to state she was out due to not having her medications causing her to go to the hospital but was released once everything was controlled and she began taking her medications again. Please follow up. Patient states she will drop off the form as well that has to be filled out. Patient states if she doesn't get the paperwork this week she will be out of a job.

## 2019-03-08 NOTE — Telephone Encounter (Signed)
Paperwork completed and faxed over 

## 2019-03-08 NOTE — Telephone Encounter (Signed)
Patient called to check on the status of her work Physicist, medical. Please follow up.

## 2019-03-08 NOTE — Telephone Encounter (Signed)
Paperwork has been placed in [employer]. Will route to PCP for review.

## 2019-03-12 ENCOUNTER — Other Ambulatory Visit: Payer: Self-pay

## 2019-03-12 ENCOUNTER — Ambulatory Visit: Payer: Self-pay | Attending: Family Medicine | Admitting: Family Medicine

## 2019-03-19 ENCOUNTER — Inpatient Hospital Stay: Payer: Self-pay | Admitting: Nurse Practitioner

## 2019-03-21 ENCOUNTER — Telehealth: Payer: Self-pay | Admitting: *Deleted

## 2019-03-21 ENCOUNTER — Other Ambulatory Visit: Payer: Self-pay

## 2019-03-21 ENCOUNTER — Encounter: Payer: Self-pay | Admitting: Family Medicine

## 2019-03-21 ENCOUNTER — Telehealth: Payer: Self-pay

## 2019-03-21 ENCOUNTER — Ambulatory Visit: Payer: Self-pay | Attending: Nurse Practitioner | Admitting: Family Medicine

## 2019-03-21 VITALS — BP 169/81 | HR 88 | Temp 98.3°F | Ht 66.0 in | Wt 235.2 lb

## 2019-03-21 DIAGNOSIS — F17200 Nicotine dependence, unspecified, uncomplicated: Secondary | ICD-10-CM

## 2019-03-21 DIAGNOSIS — I10 Essential (primary) hypertension: Secondary | ICD-10-CM

## 2019-03-21 DIAGNOSIS — J438 Other emphysema: Secondary | ICD-10-CM

## 2019-03-21 DIAGNOSIS — D751 Secondary polycythemia: Secondary | ICD-10-CM

## 2019-03-21 DIAGNOSIS — I5032 Chronic diastolic (congestive) heart failure: Secondary | ICD-10-CM

## 2019-03-21 DIAGNOSIS — Z79899 Other long term (current) drug therapy: Secondary | ICD-10-CM

## 2019-03-21 DIAGNOSIS — J449 Chronic obstructive pulmonary disease, unspecified: Secondary | ICD-10-CM

## 2019-03-21 DIAGNOSIS — R600 Localized edema: Secondary | ICD-10-CM

## 2019-03-21 DIAGNOSIS — R0902 Hypoxemia: Secondary | ICD-10-CM

## 2019-03-21 DIAGNOSIS — Z09 Encounter for follow-up examination after completed treatment for conditions other than malignant neoplasm: Secondary | ICD-10-CM

## 2019-03-21 MED ORDER — LISINOPRIL 40 MG PO TABS: 40.0000 mg | ORAL_TABLET | Freq: Every day | ORAL | 1 refills | Status: DC

## 2019-03-21 MED ORDER — FUROSEMIDE 40 MG PO TABS: 40.0000 mg | ORAL_TABLET | Freq: Every day | ORAL | 3 refills | Status: DC

## 2019-03-21 MED ORDER — FUROSEMIDE 40 MG PO TABS: 40.0000 mg | ORAL_TABLET | Freq: Every day | ORAL | 1 refills | Status: DC

## 2019-03-21 MED ORDER — IPRATROPIUM-ALBUTEROL 0.5-2.5 (3) MG/3ML IN SOLN: 3.0000 mL | Freq: Four times a day (QID) | RESPIRATORY_TRACT | 11 refills | Status: DC | PRN

## 2019-03-21 MED ORDER — ALBUTEROL SULFATE HFA 108 (90 BASE) MCG/ACT IN AERS: 2.0000 | INHALATION_SPRAY | RESPIRATORY_TRACT | 3 refills | Status: DC | PRN

## 2019-03-21 MED ORDER — FLUTICASONE-SALMETEROL 100-50 MCG/DOSE IN AEPB: 1.0000 | INHALATION_SPRAY | Freq: Two times a day (BID) | RESPIRATORY_TRACT | 0 refills | Status: DC

## 2019-03-21 MED ORDER — TIOTROPIUM BROMIDE MONOHYDRATE 18 MCG IN CAPS: 18.0000 ug | ORAL_CAPSULE | Freq: Every day | RESPIRATORY_TRACT | 11 refills | Status: DC

## 2019-03-21 MED ORDER — LORATADINE 10 MG PO TABS: 10.0000 mg | ORAL_TABLET | Freq: Every day | ORAL | 3 refills | Status: DC

## 2019-03-21 MED ORDER — FLUTICASONE-SALMETEROL 100-50 MCG/DOSE IN AEPB: 1.0000 | INHALATION_SPRAY | Freq: Two times a day (BID) | RESPIRATORY_TRACT | 3 refills | Status: DC

## 2019-03-21 NOTE — Progress Notes (Signed)
Hospital follow up for [facility]. Per pt since the hospital she's a bit better.  Per patient she just got laid off yesterday.
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
## 2019-03-21 NOTE — Telephone Encounter (Addendum)
Patient came into office today for ov. Provider instructed staff to have patient walk around while recording patient O2 status. Patient started off at 87% and kept fluctuating up and down from 82% to 87%. Staff informed provider and provider instructed staff to give patient oxygen set at 2L. Staff informed Erskine Squibb with walking status O2 for patient. After patient was put on 2L of Oxygen and sat for 20 minutes, patient O2 went as high as 94 but the ending O2 stat was 93 resting on oxygen. Provider was informed with information and O2 paperwork was given to [place].

## 2019-03-21 NOTE — Telephone Encounter (Signed)
Met with the patient when she was in the clinic today.  She explained that she already has O2 at home. [facility] O2 was provided at discharge from the hospital.    Call placed to [facility], spoke to [facility] who confirmed that the patient has an O2 concentrator , home fill system and portable O2 tank at home.  Provided the patient with the phone # for [place] to call if she has questions about her home O2 set up and self fill system.

## 2019-03-22 ENCOUNTER — Other Ambulatory Visit: Payer: Self-pay | Admitting: Family Medicine

## 2019-03-22 ENCOUNTER — Encounter: Payer: Self-pay | Admitting: Internal Medicine

## 2019-03-22 ENCOUNTER — Telehealth: Payer: Self-pay | Admitting: Internal Medicine

## 2019-03-22 DIAGNOSIS — D751 Secondary polycythemia: Secondary | ICD-10-CM

## 2019-03-22 LAB — CBC WITH DIFFERENTIAL/PLATELET
Basophils Absolute: 0.1 x10E3/uL (ref 0.0–0.2)
Basos: 1 %
EOS (ABSOLUTE): 0.4 x10E3/uL (ref 0.0–0.4)
Eos: 8 %
Hematocrit: 60.4 % — ABNORMAL HIGH (ref 34.0–46.6)
Hemoglobin: 19.2 g/dL — ABNORMAL HIGH (ref 11.1–15.9)
Immature Grans (Abs): 0 x10E3/uL (ref 0.0–0.1)
Immature Granulocytes: 0 %
Lymphocytes Absolute: 1.6 x10E3/uL (ref 0.7–3.1)
Lymphs: 32 %
MCH: 28.4 pg (ref 26.6–33.0)
MCHC: 31.8 g/dL (ref 31.5–35.7)
MCV: 89 fL (ref 79–97)
Monocytes Absolute: 0.6 x10E3/uL (ref 0.1–0.9)
Monocytes: 11 %
Neutrophils Absolute: 2.4 x10E3/uL (ref 1.4–7.0)
Neutrophils: 48 %
Platelets: 197 x10E3/uL (ref 150–450)
RBC: 6.76 x10E6/uL — ABNORMAL HIGH (ref 3.77–5.28)
RDW: 16.4 % — ABNORMAL HIGH (ref 11.7–15.4)
WBC: 5.1 x10E3/uL (ref 3.4–10.8)

## 2019-03-22 LAB — BASIC METABOLIC PANEL WITH GFR
BUN/Creatinine Ratio: 22 (ref 12–28)
BUN: 18 mg/dL (ref 8–27)
CO2: 21 mmol/L (ref 20–29)
Calcium: 9.3 mg/dL (ref 8.7–10.3)
Chloride: 102 mmol/L (ref 96–106)
Creatinine, Ser: 0.83 mg/dL (ref 0.57–1.00)
GFR calc Af Amer: 86 mL/min/1.73
GFR calc non Af Amer: 75 mL/min/1.73
Glucose: 81 mg/dL (ref 65–99)
Potassium: 4 mmol/L (ref 3.5–5.2)
Sodium: 144 mmol/L (ref 134–144)

## 2019-03-22 MED FILL — $VENTOLIN HFA 18G INHALER: 108 (90 BAS | 25 days supply | Qty: 18 | Fill #0

## 2019-03-22 NOTE — Progress Notes (Signed)
Patient ID: Shari Obrien, female   DOB: [birth year], 63 y.o.   MRN: [id number]   Patient is status post hospitalization for COPD/CHF exacerbation and she also had an elevated Hgb which was thought to be due to her smoking. On repeat CBC done at her hospital follow-up her Hgb was higher at 19.2 and hematology referral will be placed.

## 2019-03-22 NOTE — Telephone Encounter (Signed)
A new hem appt has been scheduled for the pt to see Dr. Melton Alar on 5/29 at 1pm. Letter mailed.

## 2019-03-24 ENCOUNTER — Encounter: Payer: Self-pay | Admitting: Family Medicine
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
## 2019-03-28 MED FILL — FUROSEMIDE 40 MG TAB: 40 | 30 days supply | Qty: 30 | Fill #0

## 2019-03-28 MED FILL — AMLODIPINE BESYLATE 10 MG T: 10 | 30 days supply | Qty: 30 | Fill #6

## 2019-03-28 MED FILL — LISINOPRIL 40 MG TABLET: 40 | 30 days supply | Qty: 30 | Fill #0

## 2019-03-28 MED FILL — !ADVAIR 100/50 DISKUS: 100-50 | 30 days supply | Qty: 60 | Fill #0

## 2019-03-28 MED FILL — SPIRIVA 18 MCG CP-HANDIHALE: 18 | 30 days supply | Qty: 30 | Fill #0

## 2019-04-04 ENCOUNTER — Ambulatory Visit: Payer: Self-pay | Attending: Critical Care Medicine | Admitting: Critical Care Medicine

## 2019-04-04 ENCOUNTER — Encounter: Payer: Self-pay | Admitting: Critical Care Medicine

## 2019-04-04 ENCOUNTER — Other Ambulatory Visit: Payer: Self-pay

## 2019-04-04 VITALS — BP 167/93 | HR 84 | Temp 98.3°F | Resp 20 | Ht 66.0 in | Wt 239.6 lb

## 2019-04-04 DIAGNOSIS — D751 Secondary polycythemia: Secondary | ICD-10-CM

## 2019-04-04 DIAGNOSIS — J449 Chronic obstructive pulmonary disease, unspecified: Secondary | ICD-10-CM

## 2019-04-04 DIAGNOSIS — Z23 Encounter for immunization: Secondary | ICD-10-CM

## 2019-04-04 DIAGNOSIS — J9601 Acute respiratory failure with hypoxia: Secondary | ICD-10-CM

## 2019-04-04 DIAGNOSIS — Z1239 Encounter for other screening for malignant neoplasm of breast: Secondary | ICD-10-CM

## 2019-04-04 DIAGNOSIS — Z1159 Encounter for screening for other viral diseases: Secondary | ICD-10-CM

## 2019-04-04 DIAGNOSIS — F17200 Nicotine dependence, unspecified, uncomplicated: Secondary | ICD-10-CM

## 2019-04-04 DIAGNOSIS — I10 Essential (primary) hypertension: Secondary | ICD-10-CM

## 2019-04-04 DIAGNOSIS — I5032 Chronic diastolic (congestive) heart failure: Secondary | ICD-10-CM

## 2019-04-04 DIAGNOSIS — J9611 Chronic respiratory failure with hypoxia: Secondary | ICD-10-CM

## 2019-04-04 DIAGNOSIS — F17210 Nicotine dependence, cigarettes, uncomplicated: Secondary | ICD-10-CM

## 2019-04-04 DIAGNOSIS — Z1211 Encounter for screening for malignant neoplasm of colon: Secondary | ICD-10-CM

## 2019-04-04 DIAGNOSIS — J439 Emphysema, unspecified: Secondary | ICD-10-CM

## 2019-04-04 MED ORDER — METOPROLOL TARTRATE 50 MG PO TABS: 50.0000 mg | ORAL_TABLET | Freq: Two times a day (BID) | ORAL | 3 refills | Status: DC

## 2019-04-04 MED ORDER — FUROSEMIDE 40 MG PO TABS: ORAL_TABLET | ORAL | 1 refills | Status: DC

## 2019-04-04 MED FILL — METOPROLOL TARTRATE 50 MG T: 50 | 30 days supply | Qty: 60 | Fill #0

## 2019-04-04 NOTE — Progress Notes (Signed)
Subjective:    Patient ID: Shari Obrien, female    DOB: 1956/03/02, 63 y.o.   MRN: [id number]  This is a 63 year old female who was seen as a post hospital follow-up and referral from primary care for COPD.  The patient has a history of hypoxemia and secondary polycythemia with hemoglobin up to 19.  The patient is also had chronic diastolic heart failure and aortic atherosclerosis.  Patient has longstanding hypertension that is been difficult to control.  The patient had previously been in the hospital in April and treated for COPD exacerbation and diastolic heart failure.  The patient was sent out on home oxygen but she has not been completely adherent to the home oxygen.  Patient continues to have lower extremity edema.  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  She does not wear oxygen at the workplace.  She is taking furosemide 40 mg daily along with the potassium supplementation but continues to have weight gain and as well fluid edema in the lower extremities.  She has not had pulmonary function studies previously.  She is taking the Advair and Spiriva on a regular basis however her inhaler technique is poor when demonstrated to me in the office at this time  This patient does smoke but is now trying to quit smoking and has not had a cigarette in the last 3 days  Wt Readings from Last 3 Encounters: 04/04/19 : 239 lb 9.6 oz (108.7 kg) 03/21/19 : 235 lb 3.2 oz (106.7 kg) 03/02/19 : 237 lb 14.4 oz (107.9 kg)  Please review shortness of breath assessment below  Shortness of Breath  This is a chronic problem. The current episode started more than 1 year ago. The problem occurs daily (exertion and occasionally at rest). The problem has been gradually improving. Associated symptoms include leg swelling, orthopnea, sputum production and wheezing. Pertinent negatives include no chest pain, claudication, coryza, ear pain, fever,  headaches, hemoptysis, leg pain, PND, rhinorrhea or sore throat. The symptoms are aggravated by fumes, odors, lying flat, any activity and exercise. Associated symptoms comments: Mucus now clearer.  Was brown . Risk factors include smoking. She has tried beta agonist inhalers, oral steroids and steroid inhalers for the symptoms. Her past medical history is significant for COPD, a heart failure and pneumonia. There is no history of asthma, CAD, DVT or PE.    Past Medical History:  Diagnosis Date  . Aortic atherosclerosis (HCC)   . CHF (congestive heart failure) (HCC)   . COPD (chronic obstructive pulmonary disease) (HCC) Dx 2013  . HTN (hypertension) 08/30/2013  . Hypertension   . Lower leg edema 06/2016     Family History  Problem Relation Age of Onset  . Hypertension Maternal Grandmother      Social History   Socioeconomic History  . Marital status: Legally Separated    Spouse name: Not on file  . Number of children: Not on file  . Years of education: Not on file  . Highest education level: Not on file  Occupational History  . Not on file  Social Needs  . Financial resource strain: Not on file  . Food insecurity:    Worry: Not on file    Inability: Not on file  . Transportation needs:    Medical: Not on file    Non-medical: Not on file  Tobacco Use  . Smoking status: Current Every Day Smoker    Packs/day:  0.50    Years: 38.00    Pack years: 19.00    Types: Cigarettes  . Smokeless tobacco: Never Used  Substance and Sexual Activity  . Alcohol use: No  . Drug use: No  . Sexual activity: Not on file  Lifestyle  . Physical activity:    Days per week: Not on file    Minutes per session: Not on file  . Stress: Not on file  Relationships  . Social connections:    Talks on phone: Not on file    Gets together: Not on file    Attends religious service: Not on file    Active member of club or organization: Not on file    Attends meetings of clubs or organizations: Not on  file    Relationship status: Not on file  . Intimate partner violence:    Fear of current or ex partner: Not on file    Emotionally abused: Not on file    Physically abused: Not on file    Forced sexual activity: Not on file  Other Topics Concern  . Not on file  Social History Narrative   Lives alone in her apartment in [place]. Works as a Writer at [employer].     Allergies  Allergen Reactions  . Periactin [Cyproheptadine] Swelling     Outpatient Medications Prior to Visit  Medication Sig Dispense Refill  . albuterol (VENTOLIN HFA) 108 (90 Base) MCG/ACT inhaler Inhale 2 puffs into the lungs every 4 (four) hours as needed for wheezing or shortness of breath. 18 g 3  . amLODipine (NORVASC) 10 MG tablet Take 1 tablet (10 mg total) by mouth daily. 30 tablet 0  . Aspirin-Caffeine (BAYER BACK & BODY PAIN EX ST PO) Take 2 tablets by mouth as needed (back pain).    . Fluticasone-Salmeterol (ADVAIR DISKUS) 100-50 MCG/DOSE AEPB Inhale 1 puff into the lungs 2 (two) times daily. 60 each 3  . ipratropium-albuterol (DUONEB) 0.5-2.5 (3) MG/3ML SOLN Take 3 mLs by nebulization every 6 (six) hours as needed. 360 mL 11  . lisinopril (ZESTRIL) 40 MG tablet Take 1 tablet (40 mg total) by mouth daily. To lower blood pressure 90 tablet 1  . potassium chloride SA (K-DUR,KLOR-CON) 20 MEQ tablet Take 1 tablet (20 mEq total) by mouth daily. 30 tablet 0  . tiotropium (SPIRIVA HANDIHALER) 18 MCG inhalation capsule Place 1 capsule (18 mcg total) into inhaler and inhale daily. 30 capsule 11  . furosemide (LASIX) 40 MG tablet Take 1 tablet (40 mg total) by mouth daily. 90 tablet 1  . cetirizine (ZYRTEC) 10 MG tablet Take 1 tablet (10 mg total) by mouth daily as needed for allergies. (Patient not taking: Reported on 03/21/2019) 30 tablet 0  . loratadine (ALLERGY RELIEF) 10 MG tablet Take 1 tablet (10 mg total) by mouth daily. For nasal congestion/allergy symptoms (Patient not taking: Reported on 04/04/2019) 90 tablet  3  . OXYGEN Inhale 2 L into the lungs.    . triamcinolone (KENALOG) 0.025 % ointment APPLY 1 APPLICATION TOPICALLY 2 TIMES DAILY. (Patient taking differently: Apply 1 application topically 2 (two) times daily as needed (rash). APPLY 1 APPLICATION TOPICALLY 2 TIMES DAILY.) 80 g 5  . predniSONE (DELTASONE) 10 MG tablet Take 4 tablets (40 mg) daily for 2 days, then, Take 3 tablets (30 mg) daily for 2 days, then, Take 2 tablets (20 mg) daily for 2 days, then, Take 1 tablets (10 mg) daily for 1 days, then stop (Patient not taking: Reported on  04/04/2019) 19 tablet 0   No facility-administered medications prior to visit.      Review of Systems  Constitutional: Negative for fever.  HENT: Negative for ear pain, rhinorrhea and sore throat.   Respiratory: Positive for sputum production, shortness of breath and wheezing. Negative for hemoptysis.   Cardiovascular: Positive for orthopnea and leg swelling. Negative for chest pain, claudication and PND.  Neurological: Negative for headaches.       Objective:   Physical Exam Vitals:   04/04/19 0957  BP: (!) 167/93  Pulse: 84  Resp: 20  Temp: 98.3 F (36.8 C)  TempSrc: Oral  SpO2: (!) 86%  Weight: 239 lb 9.6 oz (108.7 kg)  Height:  (1.676 m)    Gen: Pleasant, obese  in no distress,  normal affect  ENT: No lesions,  mouth clear,  oropharynx clear, no postnasal drip  Neck: No JVD, no TMG, no carotid bruits  Lungs: No use of accessory muscles, no dullness to percussion, distant breath sounds without wheezes  Cardiovascular: RRR, heart sounds normal, no murmur or gallops,  4+ peripheral edema  Abdomen: soft and NT, no HSM,  BS normal  Musculoskeletal: No deformities, no cyanosis or clubbing  Neuro: alert, non focal  Skin: Warm, no lesions or rashes BMP Latest Ref Rng & Units 03/21/2019 03/02/2019 03/01/2019  Glucose 65 - 99 mg/dL 81 161(W) 960(A)  BUN 8 - 27 mg/dL Creatinine 0.57 - 1.00 mg/dL 5.40 9.81 1.91(Y)   BUN/Creat Ratio 12 - 28 22 - -  Sodium 134 - 144 mmol/L 144 138 140  Potassium 3.5 - 5.2 mmol/L 4.0 4.2 4.2  Chloride 96 - 106 mmol/L 102 99 101  CO2 20 - 29 mmol/L Calcium 8.7 - 10.3 mg/dL 9.3 7.8(G) 9.5(A)   CBC Latest Ref Rng & Units 03/21/2019 03/01/2019 02/28/2019  WBC 3.4 - 10.8 x10E3/uL 5.1 5.1 5.6  Hemoglobin 11.1 - 15.9 g/dL 19.2(H) 17.8(H) 17.4(H)  Hematocrit 34.0 - 46.6 % 60.4(H) 57.5(H) 56.5(H)  Platelets 150 - 450 x10E3/uL 197 213 206   CXR 4/7: IMPRESSION: Bibasilar atelectasis and small pleural effusions. Mild vascular congestion is noted.     Assessment & Plan:  I personally reviewed all images and lab data in the [facility] system as well as any outside material available during this office visit and agree with the  radiology impressions.   Diastolic heart failure (HCC) History of diastolic heart failure on a chronic basis note echocardiogram reviewed from 2017 shows collapse of the ventricular cavities during systole and significant stiffness in the diastole  This patient also has resting tachycardia and would benefit from a beta-blocker  Plan will be to add metoprolol 50 mg twice daily and continue furosemide but with an increased dose of 80 mg a day for 4 days then reduce to 40 mg a day thereafter  The patient will continue potassium supplementation Will also continue amlodipine and lisinopril  We will follow-up complete metabolic panel at this visit    Essential hypertension Hypertension with poor control again by adding beta-blockade hope to allow lowering further the blood pressure  COPD (chronic obstructive pulmonary disease) (HCC) Chronic obstructive lung disease without previous pulmonary function testing with associated chronic hypoxic respiratory failure  Plan will be to continue Advair and Spiriva I did go over the patient proper inhaler technique as she was inhaling too quickly on the dry powdered inhalers  No additional prednisone is indicated   Albuterol as needed we  will continue  I recommended more compliance to oxygen therapy on a continuous basis  Respiratory failure with hypoxia (HCC) Chronic respiratory failure secondary to chronic obstructive lung disease and associated polycythemia on a secondary basis  Continue oxygen therapy on a continuous basis is recommended 2 L  Current smoker The patient has finally quit smoking but only for 3 days I gave the patient additional smoking cessation counseling  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  Diagnoses and all orders for this visit:  Pulmonary emphysema, unspecified emphysema type (HCC) -     Pulmonary Function Test; Future  Essential hypertension -     furosemide (LASIX) 40 MG tablet; Take two daily for 4 days then reduce to one daily -     Basic metabolic panel; Future  Chronic diastolic heart failure (HCC) -     furosemide (LASIX) 40 MG tablet; Take two daily for 4 days then reduce to one daily -     Basic metabolic panel; Future -     Magnesium; Future  Need for hepatitis C screening test -     Hepatitis C antibody  Colon cancer screening -     Fecal occult blood, imunochemical  Breast cancer screening -     MM DIGITAL SCREENING BILATERAL; Future  Chronic respiratory failure with hypoxia (HCC) -     Magnesium; Future  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    I had an extended discussion with the patient and or family lasting of a 25 minute visit including: Smoking cessation and salt restriction in the diet   A tetanus vaccine was due and this was administered  We will also obtain a fecal  occult study for colon cancer screening and send a hepatitis C study check magnesium level scheduled for mammogram screening and repeat complete blood count and basic metabolic panel.  Also pulmonary function testings have been  scheduled

## 2019-04-04 NOTE — Assessment & Plan Note (Signed)
Hypertension with poor control again by adding beta-blockade hope to allow lowering further the blood pressure

## 2019-04-04 NOTE — Assessment & Plan Note (Signed)
Chronic respiratory failure secondary to chronic obstructive lung disease and associated polycythemia on a secondary basis  Continue oxygen therapy on a continuous basis is recommended 2 L

## 2019-04-04 NOTE — Assessment & Plan Note (Signed)
Chronic obstructive lung disease without previous pulmonary function testing with associated chronic hypoxic respiratory failure  Plan will be to continue Advair and Spiriva I did go over the patient proper inhaler technique as she was inhaling too quickly on the dry powdered inhalers  No additional prednisone is indicated  Albuterol as needed we will continue  I recommended more compliance to oxygen therapy on a continuous basis

## 2019-04-04 NOTE — Assessment & Plan Note (Signed)
The patient has finally quit smoking but only for 3 days I gave the patient additional smoking cessation counseling

## 2019-04-04 NOTE — Patient Instructions (Addendum)
Stay on Spiriva and Advair as prescribed please remember to slow down your inhalation rate as you take those inhalers  Increase furosemide to 2 tablets daily for 4 days then reduce to 1 tablet daily thereafter  No additional prednisone is needed at this time  A lung function test will be ordered  A screening mammogram for breast cancer will be ordered along with a stool card will be given to you to screen you for colon cancer for blood in the stool  Please keep the referral appointment with the blood doctor  Labs today will include a complete blood count, basic metabolic profile, and magnesium level  Please wear your oxygen more consistently including the portable oxygen system  Please obtain a large support hose for your lower extremities  Please watch your salt intake and follow the diet as outlined below for your high blood pressure   Hypertension Hypertension, commonly called high blood pressure, is when the force of blood pumping through the arteries is too strong. The arteries are the blood vessels that carry blood from the heart throughout the body. Hypertension forces the heart to work harder to pump blood and may cause arteries to become narrow or stiff. Having untreated or uncontrolled hypertension can cause heart attacks, strokes, kidney disease, and other problems. A blood pressure reading consists of a higher number over a lower number. Ideally, your blood pressure should be below 120/80. The first ("top") number is called the systolic pressure. It is a measure of the pressure in your arteries as your heart beats. The second ("bottom") number is called the diastolic pressure. It is a measure of the pressure in your arteries as the heart relaxes. What are the causes? The cause of this condition is not known. What increases the risk? Some risk factors for high blood pressure are under your control. Others are not. Factors you can change  Smoking.  Having type 2 diabetes  mellitus, high cholesterol, or both.  Not getting enough exercise or physical activity.  Being overweight.  Having too much fat, sugar, calories, or salt (sodium) in your diet.  Drinking too much alcohol. Factors that are difficult or impossible to change  Having chronic kidney disease.  Having a family history of high blood pressure.  Age. Risk increases with age.  Race. You may be at higher risk if you are African-American.  Gender. Men are at higher risk than women before age 54. After age 83, women are at higher risk than men.  Having obstructive sleep apnea.  Stress. What are the signs or symptoms? Extremely high blood pressure (hypertensive crisis) may cause:  Headache.  Anxiety.  Shortness of breath.  Nosebleed.  Nausea and vomiting.  Severe chest pain.  Jerky movements you cannot control (seizures). How is this diagnosed? This condition is diagnosed by measuring your blood pressure while you are seated, with your arm resting on a surface. The cuff of the blood pressure monitor will be placed directly against the skin of your upper arm at the level of your heart. It should be measured at least twice using the same arm. Certain conditions can cause a difference in blood pressure between your right and left arms. Certain factors can cause blood pressure readings to be lower or higher than normal (elevated) for a short period of time:  When your blood pressure is higher when you are in a health care provider's office than when you are at home, this is called white coat hypertension. Most people with this  condition do not need medicines.  When your blood pressure is higher at home than when you are in a health care provider's office, this is called masked hypertension. Most people with this condition may need medicines to control blood pressure. If you have a high blood pressure reading during one visit or you have normal blood pressure with other risk  factors:  You may be asked to return on a different day to have your blood pressure checked again.  You may be asked to monitor your blood pressure at home for 1 week or longer. If you are diagnosed with hypertension, you may have other blood or imaging tests to help your health care provider understand your overall risk for other conditions. How is this treated? This condition is treated by making healthy lifestyle changes, such as eating healthy foods, exercising more, and reducing your alcohol intake. Your health care provider may prescribe medicine if lifestyle changes are not enough to get your blood pressure under control, and if:  Your systolic blood pressure is above 130.  Your diastolic blood pressure is above 80. Your personal target blood pressure may vary depending on your medical conditions, your age, and other factors. Follow these instructions at home: Eating and drinking   Eat a diet that is high in fiber and potassium, and low in sodium, added sugar, and fat. An example eating plan is called the DASH (Dietary Approaches to Stop Hypertension) diet. To eat this way: ? Eat plenty of fresh fruits and vegetables. Try to fill half of your plate at each meal with fruits and vegetables. ? Eat whole grains, such as whole wheat pasta, brown rice, or whole grain bread. Fill about one quarter of your plate with whole grains. ? Eat or drink low-fat dairy products, such as skim milk or low-fat yogurt. ? Avoid fatty cuts of meat, processed or cured meats, and poultry with skin. Fill about one quarter of your plate with lean proteins, such as fish, chicken without skin, beans, eggs, and tofu. ? Avoid premade and processed foods. These tend to be higher in sodium, added sugar, and fat.  Reduce your daily sodium intake. Most people with hypertension should eat less than 1,500 mg of sodium a day.  Limit alcohol intake to no more than 1 drink a day for nonpregnant women and 2 drinks a day for  men. One drink equals 12 oz of beer, 5 oz of wine, or 1 oz of hard liquor. Lifestyle   Work with your health care provider to maintain a healthy body weight or to lose weight. Ask what an ideal weight is for you.  Get at least 30 minutes of exercise that causes your heart to beat faster (aerobic exercise) most days of the week. Activities may include walking, swimming, or biking.  Include exercise to strengthen your muscles (resistance exercise), such as pilates or lifting weights, as part of your weekly exercise routine. Try to do these types of exercises for 30 minutes at least 3 days a week.  Do not use any products that contain nicotine or tobacco, such as cigarettes and e-cigarettes. If you need help quitting, ask your health care provider.  Monitor your blood pressure at home as told by your health care provider.  Keep all follow-up visits as told by your health care provider. This is important. Medicines  Take over-the-counter and prescription medicines only as told by your health care provider. Follow directions carefully. Blood pressure medicines must be taken as prescribed.  Do  not skip doses of blood pressure medicine. Doing this puts you at risk for problems and can make the medicine less effective.  Ask your health care provider about side effects or reactions to medicines that you should watch for. Contact a health care provider if:  You think you are having a reaction to a medicine you are taking.  You have headaches that keep coming back (recurring).  You feel dizzy.  You have swelling in your ankles.  You have trouble with your vision. Get help right away if:  You develop a severe headache or confusion.  You have unusual weakness or numbness.  You feel faint.  You have severe pain in your chest or abdomen.  You vomit repeatedly.  You have trouble breathing. Summary  Hypertension is when the force of blood pumping through your arteries is too strong.  If this condition is not controlled, it may put you at risk for serious complications.  Your personal target blood pressure may vary depending on your medical conditions, your age, and other factors. For most people, a normal blood pressure is less than 120/80.  Hypertension is treated with lifestyle changes, medicines, or a combination of both. Lifestyle changes include weight loss, eating a healthy, low-sodium diet, exercising more, and limiting alcohol. This information is not intended to replace advice given to you by your health care provider. Make sure you discuss any questions you have with your health care provider. Document Released: 11/08/2005 Document Revised: 10/06/2016 Document Reviewed: 10/06/2016 Elsevier Interactive Patient Education  2019 [employer].

## 2019-04-04 NOTE — Assessment & Plan Note (Signed)
History of diastolic heart failure on a chronic basis note echocardiogram reviewed from 2017 shows collapse of the ventricular cavities during systole and significant stiffness in the diastole  This patient also has resting tachycardia and would benefit from a beta-blocker  Plan will be to add metoprolol 50 mg twice daily and continue furosemide but with an increased dose of 80 mg a day for 4 days then reduce to 40 mg a day thereafter  The patient will continue potassium supplementation Will also continue amlodipine and lisinopril  We will follow-up complete metabolic panel at this visit

## 2019-04-04 NOTE — Addendum Note (Signed)
Addended by: Laveda Norman on: 04/04/2019 02:10 PM   Modules accepted: Orders

## 2019-04-04 NOTE — Assessment & Plan Note (Signed)
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

## 2019-04-05 LAB — HEPATITIS C ANTIBODY: Hep C Virus Ab: <0.1 s/co ratio (ref 0.0–0.9)

## 2019-04-11 ENCOUNTER — Telehealth: Payer: Self-pay | Admitting: Emergency Medicine

## 2019-04-11 NOTE — Telephone Encounter (Signed)
Nurse called the patient's home phone number but received no answer and message was left on the voicemail for the patient to call back.  Return phone number given. 

## 2019-04-12 NOTE — Telephone Encounter (Signed)
Nurse called the patient's home phone number but received no answer and message was left on the voicemail for the patient to call back.  Return phone number given. 

## 2019-04-18 ENCOUNTER — Telehealth: Payer: Self-pay | Admitting: Family Medicine

## 2019-04-18 ENCOUNTER — Ambulatory Visit: Payer: Self-pay | Admitting: Family Medicine

## 2019-04-18 NOTE — Telephone Encounter (Signed)
Call was placed to patient and a voicemail was left informing patient to return phone call. 

## 2019-04-18 NOTE — Telephone Encounter (Signed)
Pt called to get her results please follow up

## 2019-04-20 ENCOUNTER — Ambulatory Visit: Payer: Self-pay

## 2019-04-20 ENCOUNTER — Inpatient Hospital Stay: Payer: Self-pay | Attending: Internal Medicine | Admitting: Internal Medicine

## 2019-04-23 ENCOUNTER — Encounter: Payer: Self-pay | Admitting: Family Medicine

## 2019-04-23 ENCOUNTER — Other Ambulatory Visit: Payer: Self-pay

## 2019-04-23 ENCOUNTER — Ambulatory Visit: Payer: Self-pay | Attending: Family Medicine | Admitting: Family Medicine

## 2019-04-23 VITALS — BP 140/90 | HR 92 | Temp 98.4°F | Ht 66.0 in | Wt 240.2 lb

## 2019-04-23 DIAGNOSIS — F17200 Nicotine dependence, unspecified, uncomplicated: Secondary | ICD-10-CM

## 2019-04-23 DIAGNOSIS — L2089 Other atopic dermatitis: Secondary | ICD-10-CM

## 2019-04-23 DIAGNOSIS — I10 Essential (primary) hypertension: Secondary | ICD-10-CM

## 2019-04-23 DIAGNOSIS — J438 Other emphysema: Secondary | ICD-10-CM

## 2019-04-23 DIAGNOSIS — I5032 Chronic diastolic (congestive) heart failure: Secondary | ICD-10-CM

## 2019-04-23 DIAGNOSIS — J9611 Chronic respiratory failure with hypoxia: Secondary | ICD-10-CM

## 2019-04-23 DIAGNOSIS — M79642 Pain in left hand: Secondary | ICD-10-CM

## 2019-04-23 MED ORDER — TRIAMCINOLONE ACETONIDE 0.025 % EX OINT: 1.0000 "application " | TOPICAL_OINTMENT | Freq: Two times a day (BID) | CUTANEOUS | 1 refills | Status: DC | PRN

## 2019-04-23 MED ORDER — BUPROPION HCL ER (SR) 150 MG PO TB12: 150.0000 mg | ORAL_TABLET | Freq: Two times a day (BID) | ORAL | 2 refills | Status: DC

## 2019-04-23 MED ORDER — MISC. DEVICES MISC: 0 refills | Status: DC

## 2019-04-23 MED ORDER — FLUTICASONE-SALMETEROL 100-50 MCG/DOSE IN AEPB: 1.0000 | INHALATION_SPRAY | Freq: Two times a day (BID) | RESPIRATORY_TRACT | 3 refills | Status: DC

## 2019-04-23 MED ORDER — NAPROXEN 500 MG PO TABS: 500.0000 mg | ORAL_TABLET | Freq: Two times a day (BID) | ORAL | 0 refills | Status: DC

## 2019-04-23 MED FILL — TRIAMCINOLONE 0.025% OINT: 0.025 | 21 days supply | Qty: 80 | Fill #0

## 2019-04-23 MED FILL — !ADVAIR 100/50 DISKUS: 100-50 | 30 days supply | Qty: 60 | Fill #1

## 2019-04-23 MED FILL — BUPROPION SR 150 MG TABLET: 150 | 30 days supply | Qty: 60 | Fill #0

## 2019-04-23 MED FILL — $VENTOLIN HFA 18G INHALER: 108 (90 BAS | 25 days supply | Qty: 18 | Fill #1

## 2019-04-23 NOTE — Patient Instructions (Signed)
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
## 2019-04-24 ENCOUNTER — Ambulatory Visit: Payer: Self-pay | Admitting: Family Medicine

## 2019-04-30 ENCOUNTER — Other Ambulatory Visit: Payer: Self-pay | Admitting: Family Medicine

## 2019-04-30 DIAGNOSIS — I10 Essential (primary) hypertension: Secondary | ICD-10-CM

## 2019-04-30 MED FILL — LISINOPRIL 40 MG TABLET: 40 | 30 days supply | Qty: 30 | Fill #1

## 2019-05-02 ENCOUNTER — Other Ambulatory Visit: Payer: Self-pay | Admitting: Family Medicine

## 2019-05-02 DIAGNOSIS — I10 Essential (primary) hypertension: Secondary | ICD-10-CM

## 2019-05-02 MED FILL — ?AMLODIPINE BESYLATE 10 MG: 10 | 30 days supply | Qty: 30 | Fill #0

## 2019-05-02 MED FILL — FUROSEMIDE 40 MG TAB: 40 | 30 days supply | Qty: 30 | Fill #1

## 2019-05-02 MED FILL — ?FUROSEMIDE 40 MG TABLET: 40 | 30 days supply | Qty: 30 | Fill #1

## 2019-05-02 NOTE — Telephone Encounter (Signed)
Pt has refills ready at the pharmacy for furosemide and lisinopril, the amlodipine will be forwarded to her pcp for approval.

## 2019-05-02 NOTE — Telephone Encounter (Signed)
1) Medication(s) Requested (by name): Amlodipine Furosemide lisinopril  2) Pharmacy of Choice:  [email or web address]  Advised to go to the ED or UC for sooner prescription if needed.

## 2019-05-18 MED FILL — !VENTOLIN HFA INHALER: 108 (90 BAS | 25 days supply | Qty: 18 | Fill #2

## 2019-05-18 MED FILL — !ADVAIR 100/50 DISKUS: 100-50 | 30 days supply | Qty: 60 | Fill #2

## 2019-05-24 ENCOUNTER — Other Ambulatory Visit: Payer: Self-pay

## 2019-05-24 ENCOUNTER — Emergency Department (HOSPITAL_COMMUNITY): Payer: No Typology Code available for payment source

## 2019-05-24 ENCOUNTER — Encounter (HOSPITAL_COMMUNITY): Payer: Self-pay | Admitting: Emergency Medicine

## 2019-05-24 ENCOUNTER — Emergency Department (HOSPITAL_COMMUNITY)
Admission: EM | Admit: 2019-05-24 | Discharge: 2019-05-24 | Disposition: A | Discharge status: Home / Self Care | Payer: No Typology Code available for payment source | Attending: Emergency Medicine | Admitting: Emergency Medicine

## 2019-05-24 DIAGNOSIS — S3992XA Unspecified injury of lower back, initial encounter: Secondary | ICD-10-CM | POA: Diagnosis present

## 2019-05-24 DIAGNOSIS — I110 Hypertensive heart disease with heart failure: Secondary | ICD-10-CM | POA: Insufficient documentation

## 2019-05-24 DIAGNOSIS — I509 Heart failure, unspecified: Secondary | ICD-10-CM | POA: Diagnosis not present

## 2019-05-24 DIAGNOSIS — Z79899 Other long term (current) drug therapy: Secondary | ICD-10-CM | POA: Diagnosis not present

## 2019-05-24 DIAGNOSIS — Y9389 Activity, other specified: Secondary | ICD-10-CM | POA: Diagnosis not present

## 2019-05-24 DIAGNOSIS — M545 Low back pain, unspecified: Secondary | ICD-10-CM

## 2019-05-24 DIAGNOSIS — S299XXA Unspecified injury of thorax, initial encounter: Secondary | ICD-10-CM | POA: Diagnosis not present

## 2019-05-24 DIAGNOSIS — Y999 Unspecified external cause status: Secondary | ICD-10-CM | POA: Insufficient documentation

## 2019-05-24 DIAGNOSIS — S32028A Other fracture of second lumbar vertebra, initial encounter for closed fracture: Secondary | ICD-10-CM | POA: Diagnosis not present

## 2019-05-24 DIAGNOSIS — F17210 Nicotine dependence, cigarettes, uncomplicated: Secondary | ICD-10-CM | POA: Diagnosis not present

## 2019-05-24 DIAGNOSIS — J449 Chronic obstructive pulmonary disease, unspecified: Secondary | ICD-10-CM | POA: Insufficient documentation

## 2019-05-24 DIAGNOSIS — Y92414 Local residential or business street as the place of occurrence of the external cause: Secondary | ICD-10-CM | POA: Insufficient documentation

## 2019-05-24 DIAGNOSIS — S32009A Unspecified fracture of unspecified lumbar vertebra, initial encounter for closed fracture: Secondary | ICD-10-CM

## 2019-05-24 DIAGNOSIS — S32018A Other fracture of first lumbar vertebra, initial encounter for closed fracture: Secondary | ICD-10-CM | POA: Insufficient documentation

## 2019-05-24 LAB — CBC WITH DIFFERENTIAL/PLATELET
Abs Immature Granulocytes: 0.02 10*3/uL (ref 0.00–0.07)
Basophils Absolute: 0.1 10*3/uL (ref 0.0–0.1)
Basophils Relative: 1 %
Eosinophils Absolute: 0.2 10*3/uL (ref 0.0–0.5)
Eosinophils Relative: 3 %
HCT: 57.1 % — ABNORMAL HIGH (ref 36.0–46.0)
Hemoglobin: 18.3 g/dL — ABNORMAL HIGH (ref 12.0–15.0)
Immature Granulocytes: 0 %
Lymphocytes Relative: 26 %
Lymphs Abs: 1.7 10*3/uL (ref 0.7–4.0)
MCH: 28.2 pg (ref 26.0–34.0)
MCHC: 32 g/dL (ref 30.0–36.0)
MCV: 88.1 fL (ref 80.0–100.0)
Monocytes Absolute: 0.9 10*3/uL (ref 0.1–1.0)
Monocytes Relative: 14 %
Neutro Abs: 3.6 10*3/uL (ref 1.7–7.7)
Neutrophils Relative %: 56 %
Platelets: 189 10*3/uL (ref 150–400)
RBC: 6.48 MIL/uL — ABNORMAL HIGH (ref 3.87–5.11)
RDW: 17.8 % — ABNORMAL HIGH (ref 11.5–15.5)
WBC: 6.6 10*3/uL (ref 4.0–10.5)
nRBC: 0 % (ref 0.0–0.2)

## 2019-05-24 LAB — BASIC METABOLIC PANEL
Anion gap: 11 (ref 5–15)
BUN: 17 mg/dL (ref 8–23)
CO2: 25 mmol/L (ref 22–32)
Calcium: 8.7 mg/dL — ABNORMAL LOW (ref 8.9–10.3)
Chloride: 105 mmol/L (ref 98–111)
Creatinine, Ser: 0.93 mg/dL (ref 0.44–1.00)
GFR calc Af Amer: >60 mL/min (ref >60)
GFR calc non Af Amer: >60 mL/min (ref >60)
Glucose, Bld: 115 mg/dL — ABNORMAL HIGH (ref 70–99)
Potassium: 4.1 mmol/L (ref 3.5–5.1)
Sodium: 141 mmol/L (ref 135–145)

## 2019-05-24 MED ORDER — PREDNISONE 20 MG PO TABS: ORAL_TABLET | ORAL | 0 refills | Status: DC

## 2019-05-24 MED ORDER — MORPHINE SULFATE (PF) 4 MG/ML IV SOLN
4.0000 mg | Freq: Once | INTRAVENOUS | Status: AC
  Administered 2019-05-24: 10:00:00 4 mg via INTRAVENOUS
  Filled 2019-05-24: qty 1

## 2019-05-24 MED ORDER — OXYCODONE-ACETAMINOPHEN 5-325 MG PO TABS
2.0000 | ORAL_TABLET | Freq: Once | ORAL | Status: AC
  Administered 2019-05-24: 16:00:00 2 via ORAL
  Filled 2019-05-24: qty 2

## 2019-05-24 MED ORDER — IOHEXOL 300 MG/ML  SOLN
100.0000 mL | Freq: Once | INTRAMUSCULAR | Status: AC | PRN
  Administered 2019-05-24: 12:00:00 100 mL via INTRAVENOUS

## 2019-05-24 MED ORDER — OXYCODONE-ACETAMINOPHEN 5-325 MG PO TABS: 1.0000 | ORAL_TABLET | ORAL | 0 refills | Status: DC | PRN

## 2019-05-24 MED ORDER — DEXAMETHASONE SODIUM PHOSPHATE 10 MG/ML IJ SOLN
10.0000 mg | Freq: Once | INTRAMUSCULAR | Status: AC
  Administered 2019-05-24: 10 mg via INTRAVENOUS
  Filled 2019-05-24: qty 1

## 2019-05-24 NOTE — ED Notes (Signed)
Patient transported to X-ray 
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
## 2019-05-24 NOTE — ED Notes (Signed)
Pt returned from xray
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
## 2019-05-24 NOTE — Discharge Instructions (Addendum)
Take 1-2 Percocet every 6 hours as needed for severe pain.  You can alternate with ibuprofen 400 mg every 4-6 hours.  Use ice and heat alternating 20 minutes on, 20 minutes off.  Please follow-up with the neurosurgeons office for further evaluation and treatment.  Please return to emergency department if developing new or worsening symptoms including numbness in your groin, loss of bowel or bladder control, complete numbness of your leg or inability to move it, or any other new or concerning symptoms.

## 2019-05-24 NOTE — ED Notes (Signed)
Patient transported to CT 

## 2019-05-24 NOTE — ED Notes (Signed)
Paged ortho 

## 2019-05-24 NOTE — Progress Notes (Signed)
Orthopedic Tech Progress Note Patient Details:  Shari Obrien 1956-10-01 [id number]  Patient ID: [name], female   DOB: February 17, 1956, 63 y.o.   MRN: [id number]   [place] Pink 05/24/2019, 2:33 PMCalled [employer] for Lumbar brace.

## 2019-05-28 MED FILL — FUROSEMIDE 40 MG TAB: 40 | 30 days supply | Qty: 30 | Fill #2

## 2019-05-28 MED FILL — ?AMLODIPINE BESYLATE 10 MG: 10 | 30 days supply | Qty: 30 | Fill #1

## 2019-05-28 MED FILL — LISINOPRIL 40 MG TABLET: 40 | 30 days supply | Qty: 30 | Fill #2

## 2019-05-28 MED FILL — ?METOPROLOL 50 MG TABLET: 50 | 30 days supply | Qty: 60 | Fill #1

## 2019-05-29 ENCOUNTER — Telehealth: Payer: Self-pay | Admitting: Licensed Clinical Social Worker

## 2019-05-29 ENCOUNTER — Ambulatory Visit: Payer: Self-pay | Attending: Family Medicine | Admitting: Family Medicine

## 2019-05-29 ENCOUNTER — Encounter: Payer: Self-pay | Admitting: Family Medicine

## 2019-05-29 ENCOUNTER — Other Ambulatory Visit: Payer: Self-pay

## 2019-05-29 VITALS — BP 160/92 | HR 99 | Temp 98.4°F | Ht 66.0 in | Wt 231.0 lb

## 2019-05-29 DIAGNOSIS — I10 Essential (primary) hypertension: Secondary | ICD-10-CM

## 2019-05-29 DIAGNOSIS — M5416 Radiculopathy, lumbar region: Secondary | ICD-10-CM

## 2019-05-29 DIAGNOSIS — R21 Rash and other nonspecific skin eruption: Secondary | ICD-10-CM

## 2019-05-29 MED ORDER — HYDROCORTISONE 2.5 % EX CREA: TOPICAL_CREAM | Freq: Two times a day (BID) | CUTANEOUS | 1 refills | Status: DC

## 2019-05-29 MED ORDER — SPIRONOLACTONE 25 MG PO TABS: 25.0000 mg | ORAL_TABLET | Freq: Every day | ORAL | 3 refills | Status: DC

## 2019-05-29 MED ORDER — GABAPENTIN 300 MG PO CAPS: 300.0000 mg | ORAL_CAPSULE | Freq: Every day | ORAL | 3 refills | Status: DC

## 2019-05-29 MED FILL — ?SPIRONOLACTON 25MG TABLET: 25 | 30 days supply | Qty: 30 | Fill #0

## 2019-05-29 MED FILL — HYDROCORTISONE 2.5% CREAM: 2.5 | 15 days supply | Qty: 30 | Fill #0

## 2019-05-29 MED FILL — GABAPENTIN 300 MG CAPSULE: 300 | 30 days supply | Qty: 30 | Fill #0

## 2019-05-29 NOTE — Progress Notes (Signed)
Patient needs paperwork filled out to return back to work.  Patient just took BP medications before she arrived.
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
## 2019-05-29 NOTE — Telephone Encounter (Signed)
Call placed to patient to follow up on behavioral health and/or resource needs. LCSW left message for a return call.  

## 2019-06-14 ENCOUNTER — Telehealth: Payer: Self-pay | Admitting: Family Medicine

## 2019-06-14 MED FILL — FLUTICASONE-SALMETEROL 100-: 100-50 | 30 days supply | Qty: 60 | Fill #3

## 2019-06-14 MED FILL — ALBUTEROL SULFATE HFA 108 (: 108 (90 BAS | 25 days supply | Qty: 18 | Fill #3
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
## 2019-06-15 NOTE — Telephone Encounter (Signed)
Pt dropped off forms for provider to complete so pt can return to work. Pt states must be received by employer by 06/25/19 to keep her job. Call pt once ready [phone]. Placed forms in providers bin.

## 2019-06-15 NOTE — Telephone Encounter (Signed)
Patient states that she returned to work and worked for 1 week. Patient states that she need documentation to return to work. Patient will drop off paperwork.

## 2019-06-21 NOTE — Telephone Encounter (Signed)
Pt called for a status update, please follow up

## 2019-06-22 ENCOUNTER — Telehealth: Payer: Self-pay | Admitting: Family Medicine

## 2019-06-22 NOTE — Telephone Encounter (Signed)
Patient was called and informed that paperwork will be faxed over to employer once completed by PCP.

## 2019-06-22 NOTE — Telephone Encounter (Signed)
Patient called stating she needs an update on her paperwork. Patient states she has to turn her paperwork in today otherwise she will not be able to return to work. Please follow up.

## 2019-07-02 MED FILL — FUROSEMIDE 40 MG TAB: 40 | 30 days supply | Qty: 30 | Fill #3

## 2019-07-02 MED FILL — LISINOPRIL 40 MG TABLET: 40 | 30 days supply | Qty: 30 | Fill #3

## 2019-07-02 MED FILL — ?AMLODIPINE BESYLATE 10 MG: 10 | 30 days supply | Qty: 30 | Fill #2

## 2019-07-16 ENCOUNTER — Other Ambulatory Visit: Payer: Self-pay

## 2019-07-16 ENCOUNTER — Ambulatory Visit: Payer: Self-pay | Attending: Family Medicine | Admitting: Family Medicine

## 2019-07-16 ENCOUNTER — Encounter: Payer: Self-pay | Admitting: Family Medicine

## 2019-07-16 VITALS — BP 149/84 | HR 102 | Temp 98.2°F | Ht 66.0 in | Wt 230.2 lb

## 2019-07-16 DIAGNOSIS — M48061 Spinal stenosis, lumbar region without neurogenic claudication: Secondary | ICD-10-CM

## 2019-07-16 DIAGNOSIS — I10 Essential (primary) hypertension: Secondary | ICD-10-CM

## 2019-07-16 DIAGNOSIS — M5416 Radiculopathy, lumbar region: Secondary | ICD-10-CM

## 2019-07-16 DIAGNOSIS — J438 Other emphysema: Secondary | ICD-10-CM

## 2019-07-16 MED ORDER — SPIRONOLACTONE 25 MG PO TABS: 25.0000 mg | ORAL_TABLET | Freq: Every day | ORAL | 3 refills | Status: DC

## 2019-07-16 MED ORDER — FLUTICASONE-SALMETEROL 100-50 MCG/DOSE IN AEPB: 1.0000 | INHALATION_SPRAY | Freq: Two times a day (BID) | RESPIRATORY_TRACT | 3 refills | Status: DC

## 2019-07-16 MED ORDER — TIZANIDINE HCL 4 MG PO TABS: 4.0000 mg | ORAL_TABLET | Freq: Three times a day (TID) | ORAL | 1 refills | Status: DC | PRN

## 2019-07-16 MED ORDER — AMLODIPINE BESYLATE 10 MG PO TABS: 10.0000 mg | ORAL_TABLET | Freq: Every day | ORAL | 6 refills | Status: DC

## 2019-07-16 MED ORDER — LISINOPRIL 40 MG PO TABS: 40.0000 mg | ORAL_TABLET | Freq: Every day | ORAL | 1 refills | Status: DC

## 2019-07-16 MED FILL — SPIRONOLACTONE 25 MG TABLET: 25 | 30 days supply | Qty: 30 | Fill #0

## 2019-07-16 MED FILL — tiZANidine HCL 4 MG TABS: 4 | 30 days supply | Qty: 90 | Fill #0

## 2019-07-16 MED FILL — !ADVAIR 100/50 DISKUS: 100-50 | 30 days supply | Qty: 60 | Fill #0

## 2019-07-16 NOTE — Progress Notes (Signed)
Subjective:  Patient ID: Shari Ramusamela J Akre, female    DOB: Oct 05, 1956  Age: 63 y.o. MRN: [id number]  CC: Hypertension   HPI Shari Obrien is a 63 year old female with history of hypertension, COPD, diastolic CHF (EF 72-53%65-70% from 2-D echo of 06/2016), polycythemia who presents today for completion of paperwork for ADA accommodation. She has back pain which radiates down to her lower back and down both lower extremities. She feels that if provided the necessary accommodations like ability to sit, stand, walk at will she should be able to perform her job functions.  At the moment she is not working.  Past Medical History:  Diagnosis Date  . Aortic atherosclerosis (HCC)   . CHF (congestive heart failure) (HCC)   . COPD (chronic obstructive pulmonary disease) (HCC) Dx 2013  . HTN (hypertension) 08/30/2013  . Hypertension   . Lower leg edema 06/2016    Past Surgical History:  Procedure Laterality Date  . ABDOMINAL HYSTERECTOMY    . BACK SURGERY      Family History  Problem Relation Age of Onset  . Hypertension Maternal Grandmother     Allergies  Allergen Reactions  . Periactin [Cyproheptadine] Swelling    Outpatient Medications Prior to Visit  Medication Sig Dispense Refill  . albuterol (VENTOLIN HFA) 108 (90 Base) MCG/ACT inhaler Inhale 2 puffs into the lungs every 4 (four) hours as needed for wheezing or shortness of breath. 18 g 3  . Aspirin-Caffeine (BAYER BACK & BODY PAIN EX ST PO) Take 2 tablets by mouth 2 (two) times a day.     Marland Kitchen. buPROPion (WELLBUTRIN SR) 150 MG 12 hr tablet Take 1 tablet (150 mg total) by mouth 2 (two) times daily. (Patient taking differently: Take 150 mg by mouth daily. ) 60 tablet 2  . furosemide (LASIX) 40 MG tablet Take two daily for 4 days then reduce to one daily (Patient taking differently: Take 40 mg by mouth daily. ) 90 tablet 1  . ipratropium-albuterol (DUONEB) 0.5-2.5 (3) MG/3ML SOLN Take 3 mLs by nebulization every 6 (six) hours as needed.  360 mL 11  . Misc. Devices MISC Portable Oxygen concentration. Diagnosis- chronic respiratory failure with hypoxia 1 each 0  . OXYGEN Inhale 2 L into the lungs continuous.     Marland Kitchen. amLODipine (NORVASC) 10 MG tablet TAKE 1 TABLET (10 MG TOTAL) BY MOUTH DAILY. 30 tablet 6  . Fluticasone-Salmeterol (ADVAIR DISKUS) 100-50 MCG/DOSE AEPB Inhale 1 puff into the lungs 2 (two) times daily. 60 each 3  . lisinopril (ZESTRIL) 40 MG tablet Take 1 tablet (40 mg total) by mouth daily. To lower blood pressure 90 tablet 1  . spironolactone (ALDACTONE) 25 MG tablet Take 1 tablet (25 mg total) by mouth daily. 30 tablet 3  . gabapentin (NEURONTIN) 300 MG capsule Take 1 capsule (300 mg total) by mouth at bedtime. (Patient not taking: Reported on 07/16/2019) 30 capsule 3  . hydrocortisone 2.5 % cream Apply topically 2 (two) times daily. (Patient not taking: Reported on 07/16/2019) 30 g 1  . oxyCODONE-acetaminophen (PERCOCET) 5-325 MG tablet Take 1-2 tablets by mouth every 4 (four) hours as needed for moderate pain or severe pain. (Patient not taking: Reported on 07/16/2019) 20 tablet 0  . predniSONE (DELTASONE) 20 MG tablet 3 tabs po day one, then 2 tabs daily x 4 days (Patient not taking: Reported on 07/16/2019) 11 tablet 0   No facility-administered medications prior to visit.      ROS Review of Systems  Constitutional:  Negative for activity change and appetite change.  HENT: Negative for sinus pressure and sore throat.   Respiratory: Negative for chest tightness, shortness of breath and wheezing.   Cardiovascular: Negative for chest pain and palpitations.  Gastrointestinal: Negative for abdominal distention, abdominal pain and constipation.  Genitourinary: Negative.   Musculoskeletal:       See hpi  Psychiatric/Behavioral: Negative for behavioral problems and dysphoric mood.    Objective:  BP (!) 149/84   Pulse (!) 102   Temp 98.2 F (36.8 C) (Oral)   Ht 5\' 6"  (1.676 m)   Wt 230 lb 3.2 oz (104.4 kg)   SpO2  92%   BMI 37.16 kg/m   BP/Weight 07/16/2019 05/29/2019 05/24/2019  Systolic BP 149 160 154  Diastolic BP 84 92 77  Wt. (Lbs) 230.2 231 230  BMI 37.16 37.28 37.12      Physical Exam Constitutional:      Appearance: She is well-developed.  Cardiovascular:     Rate and Rhythm: Normal rate.     Heart sounds: Normal heart sounds. No murmur.  Pulmonary:     Effort: Pulmonary effort is normal.     Breath sounds: Normal breath sounds. No wheezing or rales.  Chest:     Chest wall: No tenderness.  Abdominal:     General: Bowel sounds are normal. There is no distension.     Palpations: Abdomen is soft. There is no mass.     Tenderness: There is no abdominal tenderness.  Musculoskeletal: Normal range of motion.  Neurological:     Mental Status: She is alert and oriented to person, place, and time.     CMP Latest Ref Rng & Units 05/24/2019 03/21/2019 03/02/2019  Glucose 70 - 99 mg/dL 409(W115(H) 81 119(J172(H)  BUN 8 - 23 mg/dL 17 18 22   Creatinine 0.44 - 1.00 mg/dL 4.780.93 2.950.83 6.210.90  Sodium 135 - 145 mmol/L 141 144 138  Potassium 3.5 - 5.1 mmol/L 4.1 4.0 4.2  Chloride 98 - 111 mmol/L 105 102 99  CO2 22 - 32 mmol/L 25 21 27   Calcium 8.9 - 10.3 mg/dL 3.0(Q8.7(L) 9.3 6.5(H8.6(L)  Total Protein 6.0 - 8.5 g/dL - - -  Total Bilirubin 0.0 - 1.2 mg/dL - - -  Alkaline Phos 39 - 117 IU/L - - -  AST 0 - 40 IU/L - - -  ALT 0 - 32 IU/L - - -    Lipid Panel     Component Value Date/Time   CHOL 145 07/08/2017 0846   TRIG 69 07/08/2017 0846   HDL 36 (L) 07/08/2017 0846   CHOLHDL 4.0 07/08/2017 0846   CHOLHDL 4.0 03/14/2015 1047   VLDL 15 03/14/2015 1047   LDLCALC 95 07/08/2017 0846    CBC    Component Value Date/Time   WBC 6.6 05/24/2019 1012   RBC 6.48 (H) 05/24/2019 1012   HGB 18.3 (H) 05/24/2019 1012   HGB 19.2 (H) 03/21/2019 1224   HCT 57.1 (H) 05/24/2019 1012   HCT 60.4 (H) 03/21/2019 1224   PLT 189 05/24/2019 1012   PLT 197 03/21/2019 1224   MCV 88.1 05/24/2019 1012   MCV 89 03/21/2019 1224    MCH 28.2 05/24/2019 1012   MCHC 32.0 05/24/2019 1012   RDW 17.8 (H) 05/24/2019 1012   RDW 16.4 (H) 03/21/2019 1224   LYMPHSABS 1.7 05/24/2019 1012   LYMPHSABS 1.6 03/21/2019 1224   MONOABS 0.9 05/24/2019 1012   EOSABS 0.2 05/24/2019 1012   EOSABS 0.4 03/21/2019 1224  BASOSABS 0.1 05/24/2019 1012   BASOSABS 0.1 03/21/2019 1224    Lab Results  Component Value Date   HGBA1C 6.4 (H) 01/19/2016    Assessment & Plan:   1. Lumbar radiculopathy ADA paper work completed - tiZANidine (ZANAFLEX) 4 MG tablet; Take 1 tablet (4 mg total) by mouth every 8 (eight) hours as needed for muscle spasms.  Dispense: 90 tablet; Refill: 1  2. Spinal stenosis of lumbar region, unspecified whether neurogenic claudication present - tiZANidine (ZANAFLEX) 4 MG tablet; Take 1 tablet (4 mg total) by mouth every 8 (eight) hours as needed for muscle spasms.  Dispense: 90 tablet; Refill: 1  3. Other emphysema (HCC) - Fluticasone-Salmeterol (ADVAIR DISKUS) 100-50 MCG/DOSE AEPB; Inhale 1 puff into the lungs 2 (two) times daily.  Dispense: 60 each; Refill: 3  4. Essential hypertension - amLODipine (NORVASC) 10 MG tablet; Take 1 tablet (10 mg total) by mouth daily.  Dispense: 30 tablet; Refill: 6 - spironolactone (ALDACTONE) 25 MG tablet; Take 1 tablet (25 mg total) by mouth daily.  Dispense: 30 tablet; Refill: 3 - lisinopril (ZESTRIL) 40 MG tablet; Take 1 tablet (40 mg total) by mouth daily. To lower blood pressure  Dispense: 90 tablet; Refill: 1    Meds ordered this encounter  Medications  . Fluticasone-Salmeterol (ADVAIR DISKUS) 100-50 MCG/DOSE AEPB    Sig: Inhale 1 puff into the lungs 2 (two) times daily.    Dispense:  60 each    Refill:  3    May need PASS program  . amLODipine (NORVASC) 10 MG tablet    Sig: Take 1 tablet (10 mg total) by mouth daily.    Dispense:  30 tablet    Refill:  6  . spironolactone (ALDACTONE) 25 MG tablet    Sig: Take 1 tablet (25 mg total) by mouth daily.    Dispense:  30  tablet    Refill:  3  . lisinopril (ZESTRIL) 40 MG tablet    Sig: Take 1 tablet (40 mg total) by mouth daily. To lower blood pressure    Dispense:  90 tablet    Refill:  1  . tiZANidine (ZANAFLEX) 4 MG tablet    Sig: Take 1 tablet (4 mg total) by mouth every 8 (eight) hours as needed for muscle spasms.    Dispense:  90 tablet    Refill:  1    Follow-up: Return in about 3 months (around 10/16/2019) for medical conditions.       Charlott Rakes, MD, FAAFP. [facility] and [place], [place]   07/16/2019, 4:29 PM

## 2019-07-16 NOTE — Progress Notes (Signed)
Patient has ADA paperwork that needs to be filled out.  Patient is having pain from lower back down to feet.

## 2019-07-16 NOTE — Patient Instructions (Signed)
Spinal Stenosis ° °Spinal stenosis happens when the open space (spinal canal) between the bones of your spine (vertebrae) gets smaller. It is caused by bone pushing into the open spaces of your backbone (spine). This puts pressure on your backbone and the nerves in your backbone. Treatment often focuses on managing any pain and symptoms. In some cases, surgery may be needed. °Follow these instructions at home: °Managing pain, stiffness, and swelling ° °· Do all exercises and stretches as told by your doctor. °· Stand and sit up straight (use good posture). If you were given a brace or a corset, wear it as told by your doctor. °· Do not do any activities that cause pain. Ask your doctor what activities are safe for you. °· Do not lift anything that is heavier than 10 lb (4.5 kg) or heavier than your doctor tells you. °· Try to stay at a healthy weight. Talk with your doctor if you need help losing weight. °· If directed, put heat on the affected area as often as told by your doctor. Use the heat source that your doctor recommends, such as a moist heat pack or a heating pad. °? Put a towel between your skin and the heat source. °? Leave the heat on for 20-30 minutes. °? Remove the heat if your skin turns bright red. This is especially important if you are not able to feel pain, heat, or cold. You may have a greater risk of getting burned. °General instructions °· Take over-the-counter and prescription medicines only as told by your doctor. °· Do not use any products that contain nicotine or tobacco, such as cigarettes and e-cigarettes. If you need help quitting, ask your doctor. °· Eat a healthy diet. This includes plenty of fruits and vegetables, whole grains, and low-fat (lean) protein. °· Keep all follow-up visits as told by your doctor. This is important. °Contact a doctor if: °· Your symptoms do not get better. °· Your symptoms get worse. °· You have a fever. °Get help right away if: °· You have new or worse pain  in your neck or upper back. °· You have very bad pain that medicine does not control. °· You are dizzy. °· You have vision problems, blurred vision, or double vision. °· You have a very bad headache that is worse when you stand. °· You feel sick to your stomach (nauseous). °· You throw up (vomit). °· You have new or worse numbness or tingling in your back or legs. °· You have pain, redness, swelling, or warmth in your arm or leg. °Summary °· Spinal stenosis happens when the open space (spinal canal) between the bones of your spine gets smaller (narrow). °· Contact a doctor if your symptoms get worse. °· In some cases, surgery may be needed. °This information is not intended to replace advice given to you by your health care provider. Make sure you discuss any questions you have with your health care provider. °Document Released: 03/04/2011 Document Revised: 10/21/2017 Document Reviewed: 10/13/2016 °Elsevier Patient Education © 2020 [employer] Inc[employer] ° °

## 2019-07-24 ENCOUNTER — Ambulatory Visit: Payer: Self-pay | Admitting: Family Medicine

## 2019-07-25 ENCOUNTER — Other Ambulatory Visit: Payer: Self-pay | Admitting: Pharmacist

## 2019-07-25 DIAGNOSIS — J438 Other emphysema: Secondary | ICD-10-CM

## 2019-07-25 MED ORDER — ALBUTEROL SULFATE HFA 108 (90 BASE) MCG/ACT IN AERS: 2.0000 | INHALATION_SPRAY | RESPIRATORY_TRACT | 3 refills | Status: DC | PRN

## 2019-07-26 MED FILL — ALBUTEROL SULFATE HFA 108 (: 108 (90 BAS | 25 days supply | Qty: 9 | Fill #0

## 2019-07-31 MED FILL — LISINOPRIL 40 MG TABLET: 40 | 30 days supply | Qty: 30 | Fill #4

## 2019-07-31 MED FILL — !ADVAIR 100/50 DISKUS: 100-50 | 30 days supply | Qty: 60 | Fill #1

## 2019-07-31 MED FILL — FUROSEMIDE 40 MG TAB: 40 | 30 days supply | Qty: 30 | Fill #4

## 2019-07-31 MED FILL — ?AMLODIPINE BESYLATE 10 MG: 10 | 30 days supply | Qty: 30 | Fill #3

## 2019-09-11 MED FILL — SPIRONOLACTONE 25 MG TABLET: 25 | 30 days supply | Qty: 30 | Fill #1

## 2019-09-11 MED FILL — FUROSEMIDE 40 MG TAB: 40 | 30 days supply | Qty: 30 | Fill #5

## 2019-09-11 MED FILL — LISINOPRIL 40 MG TABLET: 40 | 30 days supply | Qty: 30 | Fill #5

## 2019-09-11 MED FILL — ?AMLODIPINE BESYLATE 10 MG: 10 | 30 days supply | Qty: 30 | Fill #4

## 2019-09-11 MED FILL — SPIRIVA 18 MCG CP-HANDIHALE: 18 | 30 days supply | Qty: 30 | Fill #1

## 2019-10-16 ENCOUNTER — Other Ambulatory Visit: Payer: Self-pay

## 2019-10-16 ENCOUNTER — Ambulatory Visit: Payer: Self-pay | Attending: Family Medicine | Admitting: Family Medicine

## 2019-10-16 ENCOUNTER — Encounter: Payer: Self-pay | Admitting: Family Medicine

## 2019-10-16 DIAGNOSIS — M5416 Radiculopathy, lumbar region: Secondary | ICD-10-CM

## 2019-10-16 DIAGNOSIS — J438 Other emphysema: Secondary | ICD-10-CM

## 2019-10-16 DIAGNOSIS — I10 Essential (primary) hypertension: Secondary | ICD-10-CM

## 2019-10-16 DIAGNOSIS — L03019 Cellulitis of unspecified finger: Secondary | ICD-10-CM

## 2019-10-16 DIAGNOSIS — I5032 Chronic diastolic (congestive) heart failure: Secondary | ICD-10-CM

## 2019-10-16 DIAGNOSIS — M48061 Spinal stenosis, lumbar region without neurogenic claudication: Secondary | ICD-10-CM

## 2019-10-16 MED ORDER — LISINOPRIL 40 MG PO TABS: 40.0000 mg | ORAL_TABLET | Freq: Every day | ORAL | 1 refills | Status: DC

## 2019-10-16 MED ORDER — SPIRONOLACTONE 25 MG PO TABS: 25.0000 mg | ORAL_TABLET | Freq: Every day | ORAL | 3 refills | Status: DC

## 2019-10-16 MED ORDER — FLUTICASONE-SALMETEROL 100-50 MCG/DOSE IN AEPB: 1.0000 | INHALATION_SPRAY | Freq: Two times a day (BID) | RESPIRATORY_TRACT | 3 refills | Status: DC

## 2019-10-16 MED ORDER — DULOXETINE HCL 60 MG PO CPEP: 60.0000 mg | ORAL_CAPSULE | Freq: Every day | ORAL | 3 refills | Status: DC

## 2019-10-16 MED ORDER — ALBUTEROL SULFATE HFA 108 (90 BASE) MCG/ACT IN AERS: 2.0000 | INHALATION_SPRAY | RESPIRATORY_TRACT | 6 refills | Status: DC | PRN

## 2019-10-16 MED ORDER — FUROSEMIDE 40 MG PO TABS: 40.0000 mg | ORAL_TABLET | Freq: Every day | ORAL | 6 refills | Status: DC

## 2019-10-16 MED ORDER — AMLODIPINE BESYLATE 10 MG PO TABS: 10.0000 mg | ORAL_TABLET | Freq: Every day | ORAL | 6 refills | Status: DC

## 2019-10-16 MED ORDER — CEPHALEXIN 500 MG PO CAPS: 500.0000 mg | ORAL_CAPSULE | Freq: Two times a day (BID) | ORAL | 0 refills | Status: DC

## 2019-10-16 MED ORDER — TIZANIDINE HCL 4 MG PO TABS: 4.0000 mg | ORAL_TABLET | Freq: Three times a day (TID) | ORAL | 3 refills | Status: DC | PRN

## 2019-10-16 MED FILL — LISINOPRIL 40 MG TABLET: 40 | 30 days supply | Qty: 30 | Fill #0

## 2019-10-16 MED FILL — ?AMLODIPINE BESYLATE 10 MG: 10 | 30 days supply | Qty: 30 | Fill #0

## 2019-10-16 MED FILL — ?DULOXETINE HCL 60 MG CPEP: 60 | 30 days supply | Qty: 30 | Fill #0

## 2019-10-16 MED FILL — ?FUROSEMIDE 40 MG TABLET: 40 | 30 days supply | Qty: 30 | Fill #0

## 2019-10-16 MED FILL — FLUTICASONE-SALMETEROL 100-: 100-50 | 30 days supply | Qty: 60 | Fill #0

## 2019-10-16 MED FILL — ALBUTEROL SULFATE HFA 108 (: 108 (90 BAS | 16 days supply | Qty: 18 | Fill #0

## 2019-10-16 MED FILL — tiZANidine HCL 4 MG TABS: 4 | 30 days supply | Qty: 90 | Fill #0

## 2019-10-16 NOTE — Progress Notes (Signed)
Virtual Visit via Telephone Note  I connected with RAND BOLLER, on 10/16/2019 at 8:38 AM by telephone due to the COVID-19 pandemic and verified that I am speaking with the correct person using two identifiers.   Consent: I discussed the limitations, risks, security and privacy concerns of performing an evaluation and management service by telephone and the availability of in person appointments. I also discussed with the patient that there may be a patient responsible charge related to this service. The patient expressed understanding and agreed to proceed.   Location of Patient: Home  Location of Provider: Clinic   Persons participating in Telemedicine visit: Vonnetta Akey Farrington-CMA Dr. Margarita Rana     History of Present Illness: Shari Obrien is a 63 year old female with history of hypertension, COPD, HFpEF(EF 65-70% from 2-D echo of 06/2016), polycythemia, lumbar radiculopathy due to spinal stenosis who presents today for a follow-up visit. She continues to experience back pain described as an 8/10 with radiation to both legs.  Denies recent falls.  She stopped Gabapentin due to a funny reaction. Currently out of [place]; unable to afford physical therapy due to lack of insurance.  She complains of swollen finger which is painful and stiff, she is unable to make a fist. It is not erythematous but she states she has a knot on the pulp of her finger.  Her emphysema is stable and she denies dyspnea, wheezing or chest pain and is compliant with her antihypertensive.  She has had no recent COPD exacerbations but thinks the albuterol causes palpitations. Past Medical History:  Diagnosis Date  . Aortic atherosclerosis ([place])   . CHF (congestive heart failure) ([place])   . COPD (chronic obstructive pulmonary disease) ([place]) Dx 2013  . HTN (hypertension) 08/30/2013  . Hypertension   . Lower leg edema 06/2016   Allergies  Allergen Reactions  . Periactin  [Cyproheptadine] Swelling    Current Outpatient Medications on File Prior to Visit  Medication Sig Dispense Refill  . albuterol (VENTOLIN HFA) 108 (90 Base) MCG/ACT inhaler Inhale 2 puffs into the lungs every 4 (four) hours as needed for wheezing or shortness of breath. 18 g 3  . amLODipine (NORVASC) 10 MG tablet Take 1 tablet (10 mg total) by mouth daily. 30 tablet 6  . Aspirin-Caffeine (BAYER BACK & BODY PAIN EX ST PO) Take 2 tablets by mouth 2 (two) times a day.     . Fluticasone-Salmeterol (ADVAIR DISKUS) 100-50 MCG/DOSE AEPB Inhale 1 puff into the lungs 2 (two) times daily. 60 each 3  . furosemide (LASIX) 40 MG tablet Take two daily for 4 days then reduce to one daily (Patient taking differently: Take 40 mg by mouth daily. ) 90 tablet 1  . hydrocortisone 2.5 % cream Apply topically 2 (two) times daily. 30 g 1  . ipratropium-albuterol (DUONEB) 0.5-2.5 (3) MG/3ML SOLN Take 3 mLs by nebulization every 6 (six) hours as needed. 360 mL 11  . lisinopril (ZESTRIL) 40 MG tablet Take 1 tablet (40 mg total) by mouth daily. To lower blood pressure 90 tablet 1  . OXYGEN Inhale 2 L into the lungs continuous.     Marland Kitchen spironolactone (ALDACTONE) 25 MG tablet Take 1 tablet (25 mg total) by mouth daily. 30 tablet 3  . tiZANidine (ZANAFLEX) 4 MG tablet Take 1 tablet (4 mg total) by mouth every 8 (eight) hours as needed for muscle spasms. 90 tablet 1  . buPROPion (WELLBUTRIN SR) 150 MG 12 hr tablet Take 1 tablet (150 mg  total) by mouth 2 (two) times daily. (Patient not taking: Reported on 10/16/2019) 60 tablet 2  . gabapentin (NEURONTIN) 300 MG capsule Take 1 capsule (300 mg total) by mouth at bedtime. (Patient not taking: Reported on 07/16/2019) 30 capsule 3  . Misc. Devices MISC Portable Oxygen concentration. Diagnosis- chronic respiratory failure with hypoxia 1 each 0  . oxyCODONE-acetaminophen (PERCOCET) 5-325 MG tablet Take 1-2 tablets by mouth every 4 (four) hours as needed for moderate pain or severe pain.  (Patient not taking: Reported on 07/16/2019) 20 tablet 0  . [DISCONTINUED] cetirizine (ZYRTEC) 10 MG tablet Take 1 tablet (10 mg total) by mouth daily as needed for allergies. (Patient not taking: Reported on 03/21/2019) 30 tablet 0  . [DISCONTINUED] loratadine (ALLERGY RELIEF) 10 MG tablet Take 1 tablet (10 mg total) by mouth daily. For nasal congestion/allergy symptoms (Patient not taking: Reported on 04/04/2019) 90 tablet 3  . [DISCONTINUED] metoprolol tartrate (LOPRESSOR) 50 MG tablet Take 1 tablet (50 mg total) by mouth 2 (two) times daily. (Patient not taking: Reported on 05/24/2019) 60 tablet 3  . [DISCONTINUED] tiotropium (SPIRIVA HANDIHALER) 18 MCG inhalation capsule Place 1 capsule (18 mcg total) into inhaler and inhale daily. (Patient not taking: Reported on 05/24/2019) 30 capsule 11   No current facility-administered medications on file prior to visit.     Observations/Objective: Awake, alert, oriented x3 Not in acute distress  CMP Latest Ref Rng & Units 05/24/2019 03/21/2019 03/02/2019  Glucose 70 - 99 mg/dL 161(W115(H) 81 960(A172(H)  BUN 8 - 23 mg/dL 17 18 22   Creatinine 0.44 - 1.00 mg/dL 5.400.93 9.810.83 1.910.90  Sodium 135 - 145 mmol/L 141 144 138  Potassium 3.5 - 5.1 mmol/L 4.1 4.0 4.2  Chloride 98 - 111 mmol/L 105 102 99  CO2 22 - 32 mmol/L 25 21 27   Calcium 8.9 - 10.3 mg/dL 4.7(W8.7(L) 9.3 2.9(F8.6(L)  Total Protein 6.0 - 8.5 g/dL - - -  Total Bilirubin 0.0 - 1.2 mg/dL - - -  Alkaline Phos 39 - 117 IU/L - - -  AST 0 - 40 IU/L - - -  ALT 0 - 32 IU/L - - -    Lipid Panel     Component Value Date/Time   CHOL 145 07/08/2017 0846   TRIG 69 07/08/2017 0846   HDL 36 (L) 07/08/2017 0846   CHOLHDL 4.0 07/08/2017 0846   CHOLHDL 4.0 03/14/2015 1047   VLDL 15 03/14/2015 1047   LDLCALC 95 07/08/2017 0846   LABVLDL 14 07/08/2017 0846    Assessment and Plan: 1. Lumbar radiculopathy Uncontrolled She will benefit from PT but has no medical coverage Advised to apply for [facility] discount to facilitate referral -  tiZANidine (ZANAFLEX) 4 MG tablet; Take 1 tablet (4 mg total) by mouth every 8 (eight) hours as needed for muscle spasms.  Dispense: 90 tablet; Refill: 3  2. Spinal stenosis of lumbar region, unspecified whether neurogenic claudication present See #1 above - tiZANidine (ZANAFLEX) 4 MG tablet; Take 1 tablet (4 mg total) by mouth every 8 (eight) hours as needed for muscle spasms.  Dispense: 90 tablet; Refill: 3  3. Essential hypertension Stable - DULoxetine (CYMBALTA) 60 MG capsule; Take 1 capsule (60 mg total) by mouth daily. For back pain  Dispense: 30 capsule; Refill: 3 - spironolactone (ALDACTONE) 25 MG tablet; Take 1 tablet (25 mg total) by mouth daily.  Dispense: 30 tablet; Refill: 3 - lisinopril (ZESTRIL) 40 MG tablet; Take 1 tablet (40 mg total) by mouth daily. To lower blood  pressure  Dispense: 90 tablet; Refill: 1 - amLODipine (NORVASC) 10 MG tablet; Take 1 tablet (10 mg total) by mouth daily.  Dispense: 30 tablet; Refill: 6  4. Chronic diastolic heart failure (HCC) Doing well on Lasix  5. Other emphysema (HCC) Stable Consider switching to Xopenex if palpitations albuterol persist She needs to apply for medication assistance Xopenex will need to be obtained via that route - Fluticasone-Salmeterol (ADVAIR DISKUS) 100-50 MCG/DOSE AEPB; Inhale 1 puff into the lungs 2 (two) times daily.  Dispense: 60 each; Refill: 3 - albuterol (VENTOLIN HFA) 108 (90 Base) MCG/ACT inhaler; Inhale 2 puffs into the lungs every 4 (four) hours as needed for wheezing or shortness of breath.  Dispense: 18 g; Refill: 6  6. Cellulitis of finger, unspecified laterality - cephALEXin (KEFLEX) 500 MG capsule; Take 1 capsule (500 mg total) by mouth 2 (two) times daily.  Dispense: 14 capsule; Refill: 0   Follow Up Instructions: Return in about 3 months (around 01/16/2020) for Medical conditions.    I discussed the assessment and treatment plan with the patient. The patient was provided an opportunity to ask  questions and all were answered. The patient agreed with the plan and demonstrated an understanding of the instructions.   The patient was advised to call back or seek an in-person evaluation if the symptoms worsen or if the condition fails to improve as anticipated.     I provided 20 minutes total of non-face-to-face time during this encounter including median intraservice time, reviewing previous notes, labs, imaging, medications, management and patient verbalized understanding.     Hoy Register, MD, FAAFP. [facility] [place], [place] [phone]   10/16/2019, 8:38 AM

## 2019-10-16 NOTE — Progress Notes (Signed)
Patient verified DOB Patient has taken medication today. Patient has not eaten today. Patient complains of bilateral leg pain described as achy. Refill on inhaler.. Patient states the albuterol inhaler causes palpitations.

## 2019-10-24 ENCOUNTER — Ambulatory Visit: Payer: Self-pay | Attending: Family Medicine

## 2019-10-24 ENCOUNTER — Other Ambulatory Visit: Payer: Self-pay

## 2019-11-29 MED FILL — ?AMLODIPINE BESYLATE 10 MG: 10 | 30 days supply | Qty: 30 | Fill #1

## 2019-11-29 MED FILL — ?FUROSEMIDE 40 MG TABLET: 40 | 30 days supply | Qty: 30 | Fill #1

## 2019-11-29 MED FILL — ALBUTEROL SULFATE HFA 108 (: 108 (90 BAS | 25 days supply | Qty: 9 | Fill #1

## 2019-11-29 MED FILL — LISINOPRIL 40 MG TABLET: 40 | 30 days supply | Qty: 30 | Fill #1

## 2019-11-30 MED FILL — ALBUTEROL SULFATE HFA 108 (: 108 (90 BAS | 16 days supply | Qty: 18 | Fill #1

## 2020-01-08 MED FILL — FLUTICASONE-SALMETEROL 100-: 100-50 | 30 days supply | Qty: 60 | Fill #1

## 2020-01-08 MED FILL — LISINOPRIL 40 MG TABLET: 40 | 30 days supply | Qty: 30 | Fill #2

## 2020-01-08 MED FILL — ?AMLODIPINE BESYL 10MG TABL: 10 | 30 days supply | Qty: 30 | Fill #2

## 2020-01-08 MED FILL — !VENTOLIN HFA INHALER: 108 (90 BAS | 16 days supply | Qty: 18 | Fill #2

## 2020-01-08 MED FILL — ?FUROSEMIDE 40 MG TABLET: 40 | 30 days supply | Qty: 30 | Fill #2

## 2020-02-27 MED FILL — AMLODIPINE BESYLATE 10 MG T: 10 | 30 days supply | Qty: 30 | Fill #5

## 2020-02-27 MED FILL — LISINOPRIL 40 MG TABLET: 40 | 30 days supply | Qty: 30 | Fill #3

## 2020-02-27 MED FILL — ALBUTEROL SULFATE HFA 108 (: 108 (90 BAS | 16 days supply | Qty: 18 | Fill #3

## 2020-02-27 MED FILL — ?SPIRONOLACTONE 25 MG TABLE: 25 | 30 days supply | Qty: 30 | Fill #2

## 2020-02-27 MED FILL — ?FUROSEMIDE 40 MG TABLET: 40 | 30 days supply | Qty: 30 | Fill #3

## 2020-03-11 MED FILL — $VENTOLIN HFA 18G INHALER: 108 (90 BAS | 48 days supply | Qty: 54 | Fill #4

## 2020-03-12 MED FILL — IPRAT-ALBUT 0.5-3(2.5) MG/3: 0.5-2.5 (3) | 30 days supply | Qty: 360 | Fill #0

## 2020-03-28 MED FILL — ?SPIRONOLACTONE 25 MG TABLE: 25 | 30 days supply | Qty: 30 | Fill #3

## 2020-03-28 MED FILL — LISINOPRIL 40 MG TABLET: 40 | 30 days supply | Qty: 30 | Fill #4

## 2020-03-28 MED FILL — ?FUROSEMIDE 4OMG TABLETS: 40 | 30 days supply | Qty: 30 | Fill #4

## 2020-03-28 MED FILL — AMLODIPINE BESYLATE 10 MG T: 10 | 30 days supply | Qty: 30 | Fill #6

## 2020-05-14 ENCOUNTER — Ambulatory Visit: Payer: Medicare HMO | Attending: Family Medicine | Admitting: Family Medicine

## 2020-05-14 ENCOUNTER — Other Ambulatory Visit: Payer: Self-pay | Admitting: Family Medicine

## 2020-05-14 ENCOUNTER — Encounter: Payer: Self-pay | Admitting: Family Medicine

## 2020-05-14 ENCOUNTER — Other Ambulatory Visit: Payer: Self-pay

## 2020-05-14 VITALS — BP 117/74 | HR 83 | Ht 66.0 in | Wt 231.4 lb

## 2020-05-14 DIAGNOSIS — I700 Atherosclerosis of aorta: Secondary | ICD-10-CM

## 2020-05-14 DIAGNOSIS — I10 Essential (primary) hypertension: Secondary | ICD-10-CM | POA: Diagnosis not present

## 2020-05-14 DIAGNOSIS — Z1211 Encounter for screening for malignant neoplasm of colon: Secondary | ICD-10-CM

## 2020-05-14 DIAGNOSIS — I5032 Chronic diastolic (congestive) heart failure: Secondary | ICD-10-CM

## 2020-05-14 DIAGNOSIS — J438 Other emphysema: Secondary | ICD-10-CM | POA: Diagnosis not present

## 2020-05-14 DIAGNOSIS — M5416 Radiculopathy, lumbar region: Secondary | ICD-10-CM | POA: Diagnosis not present

## 2020-05-14 DIAGNOSIS — M48061 Spinal stenosis, lumbar region without neurogenic claudication: Secondary | ICD-10-CM | POA: Diagnosis not present

## 2020-05-14 DIAGNOSIS — Z1231 Encounter for screening mammogram for malignant neoplasm of breast: Secondary | ICD-10-CM | POA: Diagnosis not present

## 2020-05-14 MED ORDER — AMLODIPINE BESYLATE 10 MG PO TABS: 10.0000 mg | ORAL_TABLET | Freq: Every day | ORAL | 6 refills | Status: DC

## 2020-05-14 MED ORDER — ALBUTEROL SULFATE HFA 108 (90 BASE) MCG/ACT IN AERS: 2.0000 | INHALATION_SPRAY | RESPIRATORY_TRACT | 6 refills | Status: DC | PRN

## 2020-05-14 MED ORDER — TRAMADOL HCL 50 MG PO TABS: 50.0000 mg | ORAL_TABLET | Freq: Every day | ORAL | 0 refills | Status: DC

## 2020-05-14 MED ORDER — TIZANIDINE HCL 4 MG PO TABS: 4.0000 mg | ORAL_TABLET | Freq: Three times a day (TID) | ORAL | 3 refills | Status: DC | PRN

## 2020-05-14 MED ORDER — FLUTICASONE-SALMETEROL 100-50 MCG/DOSE IN AEPB: 1.0000 | INHALATION_SPRAY | Freq: Two times a day (BID) | RESPIRATORY_TRACT | 3 refills | Status: DC

## 2020-05-14 MED ORDER — SPIRONOLACTONE 25 MG PO TABS: 25.0000 mg | ORAL_TABLET | Freq: Every day | ORAL | 6 refills | Status: DC

## 2020-05-14 MED ORDER — LISINOPRIL 40 MG PO TABS: 40.0000 mg | ORAL_TABLET | Freq: Every day | ORAL | 1 refills | Status: DC

## 2020-05-14 MED FILL — tiZANidine HCL 4 MG TABS: 4 | 30 days supply | Qty: 90 | Fill #0

## 2020-05-14 MED FILL — ALBUTEROL SULFATE HFA 108 (: 108 (90 BAS | 16 days supply | Qty: 18 | Fill #0

## 2020-05-14 MED FILL — traMADol HCL 50 MG TABS: 50 | 7 days supply | Qty: 7 | Fill #0

## 2020-05-14 NOTE — Progress Notes (Signed)
Patient is wanting a referral as discussed at recent OV for her back pain. She now has insurance.

## 2020-05-14 NOTE — Progress Notes (Signed)
Subjective:  Patient ID: Shari Obrien, female    DOB: Nov 19, 1956  Age: 64 y.o. MRN: [id number]  CC: Back Pain   HPI Shari Obrien is a 64 year old female with history of hypertension, COPD, HFpEF(EF 65-70% from 2-D echo of 06/2016), polycythemia, lumbar radiculopathy due to spinal stenosis who presents today for a follow-up visit. She has not been able to work due to her back pain which is worse when she is active or on her feet for prolonged period of time but improves when she is at rest and pain is absent at this time.  She is currently applying for disability. Pain medications she is currently on have been ineffective and she would like a referral to PT. Her COPD has been stable and from her heart failure perspective she is doing well with no exacerbations.  She endorses compliance with her medications.  Past Medical History:  Diagnosis Date  . Aortic atherosclerosis ([place])   . CHF (congestive heart failure) ([place])   . COPD (chronic obstructive pulmonary disease) ([place]) Dx 2013  . HTN (hypertension) 08/30/2013  . Hypertension   . Lower leg edema 06/2016    Past Surgical History:  Procedure Laterality Date  . ABDOMINAL HYSTERECTOMY    . BACK SURGERY      Family History  Problem Relation Age of Onset  . Hypertension Maternal Grandmother     Allergies  Allergen Reactions  . Periactin [Cyproheptadine] Swelling    Outpatient Medications Prior to Visit  Medication Sig Dispense Refill  . Aspirin-Caffeine (BAYER BACK & BODY PAIN EX ST PO) Take 2 tablets by mouth 2 (two) times a day.     . furosemide (LASIX) 40 MG tablet Take 1 tablet (40 mg total) by mouth daily. 30 tablet 6  . hydrocortisone 2.5 % cream Apply topically 2 (two) times daily. 30 g 1  . ipratropium-albuterol (DUONEB) 0.5-2.5 (3) MG/3ML SOLN Take 3 mLs by nebulization every 6 (six) hours as needed. 360 mL 11  . Misc. Devices MISC Portable Oxygen concentration. Diagnosis- chronic respiratory failure with  hypoxia 1 each 0  . OXYGEN Inhale 2 L into the lungs continuous.     Marland Kitchen albuterol (VENTOLIN HFA) 108 (90 Base) MCG/ACT inhaler Inhale 2 puffs into the lungs every 4 (four) hours as needed for wheezing or shortness of breath. 18 g 6  . amLODipine (NORVASC) 10 MG tablet Take 1 tablet (10 mg total) by mouth daily. 30 tablet 6  . Fluticasone-Salmeterol (ADVAIR DISKUS) 100-50 MCG/DOSE AEPB Inhale 1 puff into the lungs 2 (two) times daily. 60 each 3  . lisinopril (ZESTRIL) 40 MG tablet Take 1 tablet (40 mg total) by mouth daily. To lower blood pressure 90 tablet 1  . spironolactone (ALDACTONE) 25 MG tablet Take 1 tablet (25 mg total) by mouth daily. 30 tablet 3  . tiZANidine (ZANAFLEX) 4 MG tablet Take 1 tablet (4 mg total) by mouth every 8 (eight) hours as needed for muscle spasms. 90 tablet 3  . cephALEXin (KEFLEX) 500 MG capsule Take 1 capsule (500 mg total) by mouth 2 (two) times daily. (Patient not taking: Reported on 05/14/2020) 14 capsule 0  . DULoxetine (CYMBALTA) 60 MG capsule Take 1 capsule (60 mg total) by mouth daily. For back pain (Patient not taking: Reported on 05/14/2020) 30 capsule 3  . oxyCODONE-acetaminophen (PERCOCET) 5-325 MG tablet Take 1-2 tablets by mouth every 4 (four) hours as needed for moderate pain or severe pain. (Patient not taking: Reported on 07/16/2019) 20 tablet  0   No facility-administered medications prior to visit.     ROS Review of Systems  General: negative for fever, weight loss, appetite change Eyes: no visual symptoms. ENT: no ear symptoms, no sinus tenderness, no nasal congestion or sore throat. Neck: no pain  Respiratory: no wheezing, shortness of breath, cough Cardiovascular: no chest pain, no dyspnea on exertion, no pedal edema, no orthopnea. Gastrointestinal: no abdominal pain, no diarrhea, no constipation Genito-Urinary: no urinary frequency, no dysuria, no polyuria. Hematologic: no bruising Endocrine: no cold or heat intolerance Neurological: no  headaches, no seizures, no tremors Musculoskeletal: see HPI Skin: no pruritus, no rash. Psychological: no depression, no anxiety,    Objective:  BP 117/74   Pulse 83   Ht _0  (1.676 m)   Wt 231 lb 6.4 oz (105 kg)   SpO2 92%   BMI 37.35 kg/m   BP/Weight 05/14/2020 3/47/4259 03/27/3874  Systolic BP 643 329 518  Diastolic BP 74 84 92  Wt. (Lbs) 231.4 230.2 231  BMI 37.35 37.16 37.28      Physical Exam Constitutional: normal appearing,  Eyes: PERRLA HEENT: Head is atraumatic, normal sinuses, normal oropharynx, normal appearing tonsils and palate, tympanic membrane is normal bilaterally. Neck: normal range of motion, no thyromegaly, no JVD Cardiovascular: normal rate and rhythm, normal heart sounds, no murmurs, rub or gallop, no pedal edema Respiratory: Normal breath sounds, clear to auscultation bilaterally, no wheezes, no rales, no rhonchi Abdomen: soft, not tender to palpation, normal bowel sounds, no enlarged organs Musculoskeletal: Full ROM, no tenderness in joints Skin: warm and dry, no lesions. Neurological: alert, oriented x3, cranial nerves I-XII grossly intact , normal motor strength, normal sensation. Psychological: normal mood.   CMP Latest Ref Rng & Units 05/24/2019 03/21/2019 03/02/2019  Glucose 70 - 99 mg/dL 115(H) 81 172(H)  BUN 8 - 23 mg/dL _1 Creatinine 0.44 - 1.00 mg/dL 0.93 0.83 0.90  Sodium 135 - 145 mmol/L 141 144 138  Potassium 3.5 - 5.1 mmol/L 4.1 4.0 4.2  Chloride 98 - 111 mmol/L 105 102 99  CO2 22 - 32 mmol/L _2 Calcium 8.9 - 10.3 mg/dL 8.7(L) 9.3 8.6(L)  Total Protein 6.0 - 8.5 g/dL - - -  Total Bilirubin 0.0 - 1.2 mg/dL - - -  Alkaline Phos 39 - 117 IU/L - - -  AST 0 - 40 IU/L - - -  ALT 0 - 32 IU/L - - -    Lipid Panel     Component Value Date/Time   CHOL 145 07/08/2017 0846   TRIG 69 07/08/2017 0846   HDL 36 (L) 07/08/2017 0846   CHOLHDL 4.0 07/08/2017 0846   CHOLHDL 4.0 03/14/2015 1047   VLDL 15 03/14/2015 1047    LDLCALC 95 07/08/2017 0846    CBC    Component Value Date/Time   WBC 6.6 05/24/2019 1012   RBC 6.48 (H) 05/24/2019 1012   HGB 18.3 (H) 05/24/2019 1012   HGB 19.2 (H) 03/21/2019 1224   HCT 57.1 (H) 05/24/2019 1012   HCT 60.4 (H) 03/21/2019 1224   PLT 189 05/24/2019 1012   PLT 197 03/21/2019 1224   MCV 88.1 05/24/2019 1012   MCV 89 03/21/2019 1224   MCH 28.2 05/24/2019 1012   MCHC 32.0 05/24/2019 1012   RDW 17.8 (H) 05/24/2019 1012   RDW 16.4 (H) 03/21/2019 1224   LYMPHSABS 1.7 05/24/2019 1012   LYMPHSABS 1.6 03/21/2019 1224   MONOABS 0.9 05/24/2019 1012   EOSABS  0.2 05/24/2019 1012   EOSABS 0.4 03/21/2019 1224   BASOSABS 0.1 05/24/2019 1012   BASOSABS 0.1 03/21/2019 1224    Lab Results  Component Value Date   HGBA1C 6.4 (H) 01/19/2016    Assessment & Plan:  1. Chronic diastolic heart failure (HCC) EF of 65 to 70% from 2017 Euvolemic   2. Aortic atherosclerosis (HCC) Currently on statin Risk factor modification  3. Colon cancer screening - Cologuard  4. Encounter for screening mammogram for malignant neoplasm of breast - MM DIGITAL SCREENING BILATERAL; Future  5. Lumbar radiculopathy Uncontrolled Tramadol added to regimen Apply heat Weight loss will be beneficial - tiZANidine (ZANAFLEX) 4 MG tablet; Take 1 tablet (4 mg total) by mouth every 8 (eight) hours as needed for muscle spasms.  Dispense: 90 tablet; Refill: 3 - Ambulatory referral to Physical Therapy  6. Spinal stenosis of lumbar region, unspecified whether neurogenic claudication present See #5 above - tiZANidine (ZANAFLEX) 4 MG tablet; Take 1 tablet (4 mg total) by mouth every 8 (eight) hours as needed for muscle spasms.  Dispense: 90 tablet; Refill: 3 - Ambulatory referral to Physical Therapy  7. Other emphysema (HCC) Stable - Fluticasone-Salmeterol (ADVAIR DISKUS) 100-50 MCG/DOSE AEPB; Inhale 1 puff into the lungs 2 (two) times daily.  Dispense: 60 each; Refill: 3 - albuterol (VENTOLIN HFA)  108 (90 Base) MCG/ACT inhaler; Inhale 2 puffs into the lungs every 4 (four) hours as needed for wheezing or shortness of breath.  Dispense: 18 g; Refill: 6  8. Essential hypertension Controlled Counseled on blood pressure goal of less than 130/80, low-sodium, DASH diet, medication compliance, 150 minutes of moderate intensity exercise per week. Discussed medication compliance, adverse effects. - lisinopril (ZESTRIL) 40 MG tablet; Take 1 tablet (40 mg total) by mouth daily. To lower blood pressure  Dispense: 90 tablet; Refill: 1 - amLODipine (NORVASC) 10 MG tablet; Take 1 tablet (10 mg total) by mouth daily.  Dispense: 30 tablet; Refill: 6 - spironolactone (ALDACTONE) 25 MG tablet; Take 1 tablet (25 mg total) by mouth daily.  Dispense: 30 tablet; Refill: 6 - CMP14+EGFR   Health Care Maintenance: See above Meds ordered this encounter  Medications  . tiZANidine (ZANAFLEX) 4 MG tablet    Sig: Take 1 tablet (4 mg total) by mouth every 8 (eight) hours as needed for muscle spasms.    Dispense:  90 tablet    Refill:  3  . Fluticasone-Salmeterol (ADVAIR DISKUS) 100-50 MCG/DOSE AEPB    Sig: Inhale 1 puff into the lungs 2 (two) times daily.    Dispense:  60 each    Refill:  3    May need PASS program  . lisinopril (ZESTRIL) 40 MG tablet    Sig: Take 1 tablet (40 mg total) by mouth daily. To lower blood pressure    Dispense:  90 tablet    Refill:  1  . amLODipine (NORVASC) 10 MG tablet    Sig: Take 1 tablet (10 mg total) by mouth daily.    Dispense:  30 tablet    Refill:  6  . traMADol (ULTRAM) 50 MG tablet    Sig: Take 1 tablet (50 mg total) by mouth at bedtime.    Dispense:  30 tablet    Refill:  0  . spironolactone (ALDACTONE) 25 MG tablet    Sig: Take 1 tablet (25 mg total) by mouth daily.    Dispense:  30 tablet    Refill:  6  . albuterol (VENTOLIN HFA) 108 (90 Base) MCG/ACT  inhaler    Sig: Inhale 2 puffs into the lungs every 4 (four) hours as needed for wheezing or shortness of  breath.    Dispense:  18 g    Refill:  6    Follow-up: Return in about 1 month (around 06/13/2020) for medicare wellness exam.       Charlott Rakes, MD, FAAFP. [facility] and [place], [place]   05/14/2020, 4:47 PM

## 2020-05-15 LAB — CMP14+EGFR
ALT: 14 IU/L (ref 0–32)
AST: 20 IU/L (ref 0–40)
Albumin/Globulin Ratio: 1.8 (ref 1.2–2.2)
Albumin: 4.4 g/dL (ref 3.8–4.8)
Alkaline Phosphatase: 71 IU/L (ref 48–121)
BUN/Creatinine Ratio: 26 (ref 12–28)
BUN: 28 mg/dL — ABNORMAL HIGH (ref 8–27)
Bilirubin Total: 0.8 mg/dL (ref 0.0–1.2)
CO2: 22 mmol/L (ref 20–29)
Calcium: 9.2 mg/dL (ref 8.7–10.3)
Chloride: 102 mmol/L (ref 96–106)
Creatinine, Ser: 1.06 mg/dL — ABNORMAL HIGH (ref 0.57–1.00)
GFR calc Af Amer: 64 mL/min/{1.73_m2} (ref >59)
GFR calc non Af Amer: 55 mL/min/{1.73_m2} — ABNORMAL LOW (ref >59)
Globulin, Total: 2.5 g/dL (ref 1.5–4.5)
Glucose: 97 mg/dL (ref 65–99)
Potassium: 4.5 mmol/L (ref 3.5–5.2)
Sodium: 146 mmol/L — ABNORMAL HIGH (ref 134–144)
Total Protein: 6.9 g/dL (ref 6.0–8.5)

## 2020-05-19 ENCOUNTER — Telehealth: Payer: Self-pay

## 2020-05-19 NOTE — Telephone Encounter (Signed)
-----   Message from Hoy Register, MD sent at 05/15/2020 11:15 AM EDT ----- Please inform the patient that labs are normal. Thank you.

## 2020-05-19 NOTE — Telephone Encounter (Signed)
Patient was called and a voicemail was left informing patient to return phone call for lab results. 

## 2020-05-19 NOTE — Telephone Encounter (Signed)
Patient name and DOB has been verified Patient was informed of lab results. Patient had no questions.  

## 2020-05-28 MED FILL — AMLODIPINE BESYLATE 10 MG T: 10 | 90 days supply | Qty: 90 | Fill #4

## 2020-05-28 MED FILL — DULoxetine HCL 60 MG CPEP: 60 | 90 days supply | Qty: 90 | Fill #1

## 2020-05-28 MED FILL — LISINOPRIL 40 MG TABLET: 40 | 90 days supply | Qty: 90 | Fill #0

## 2020-05-28 MED FILL — FUROSEMIDE 40 MG TAB: 40 | 30 days supply | Qty: 30 | Fill #6

## 2020-06-04 ENCOUNTER — Ambulatory Visit: Payer: Medicare HMO | Attending: Family Medicine

## 2020-06-04 ENCOUNTER — Other Ambulatory Visit: Payer: Self-pay

## 2020-06-04 DIAGNOSIS — M5386 Other specified dorsopathies, lumbar region: Secondary | ICD-10-CM | POA: Diagnosis not present

## 2020-06-04 DIAGNOSIS — M5416 Radiculopathy, lumbar region: Secondary | ICD-10-CM | POA: Insufficient documentation

## 2020-06-04 DIAGNOSIS — R262 Difficulty in walking, not elsewhere classified: Secondary | ICD-10-CM | POA: Insufficient documentation
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
## 2020-06-12 ENCOUNTER — Ambulatory Visit: Payer: Medicare HMO

## 2020-06-16 ENCOUNTER — Other Ambulatory Visit: Payer: Self-pay

## 2020-06-16 ENCOUNTER — Ambulatory Visit: Payer: Medicare HMO

## 2020-06-16 DIAGNOSIS — M5416 Radiculopathy, lumbar region: Secondary | ICD-10-CM | POA: Diagnosis not present

## 2020-06-16 DIAGNOSIS — R262 Difficulty in walking, not elsewhere classified: Secondary | ICD-10-CM

## 2020-06-16 DIAGNOSIS — M5386 Other specified dorsopathies, lumbar region: Secondary | ICD-10-CM | POA: Diagnosis not present
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
## 2020-06-17 ENCOUNTER — Encounter: Payer: Medicare HMO | Admitting: Family Medicine

## 2020-06-19 ENCOUNTER — Ambulatory Visit: Payer: Medicare HMO

## 2020-06-23 ENCOUNTER — Ambulatory Visit: Payer: Medicare HMO | Attending: Family Medicine

## 2020-06-23 ENCOUNTER — Ambulatory Visit: Payer: Medicare HMO

## 2020-06-26 ENCOUNTER — Telehealth: Payer: Self-pay | Admitting: Family Medicine

## 2020-06-26 ENCOUNTER — Ambulatory Visit: Payer: Medicare HMO

## 2020-06-26 DIAGNOSIS — M5416 Radiculopathy, lumbar region: Secondary | ICD-10-CM

## 2020-06-26 NOTE — Telephone Encounter (Signed)
Please advise.  Copied from CRM [id number]. Topic: General - Other >> Jun 26, 2020  9:32 AM Marylen Ponto wrote: Reason for CRM: Pt stated she needs to speak with Dr. Baxter Flattery nurse regarding going to therapy. Pt would not provide any further details. Pt requests call back. Cb# [phone]

## 2020-06-26 NOTE — Telephone Encounter (Signed)
Patient states that she has missed 3 appointments due to the exercise that they have her doing at Pt.  She states that she would like a MRI of the back and she also states that the tramadol is not working for pain.

## 2020-06-27 NOTE — Telephone Encounter (Signed)
Order has been placed.  Please schedule MRI thank you.

## 2020-06-27 NOTE — Telephone Encounter (Signed)
Patient would like referral to neurology for her back pain.  Patient is requesting pain medication tramadol is not working.

## 2020-06-30 MED ORDER — ACETAMINOPHEN-CODEINE #3 300-30 MG PO TABS: 1.0000 | ORAL_TABLET | Freq: Two times a day (BID) | ORAL | 0 refills | Status: DC | PRN

## 2020-06-30 MED FILL — ACETAMINOPHEN-COD #3 TABLET: 300-30 | 30 days supply | Qty: 60 | Fill #0

## 2020-06-30 NOTE — Telephone Encounter (Signed)
Patient would like the referral to a spine specialist.

## 2020-06-30 NOTE — Telephone Encounter (Signed)
She should be seeing a spine specialist and not a neurologist for her back pain if she would like I am happy to place the referral. I have written a prescription for Tylenol 3 and discontinue tramadol.

## 2020-07-01 ENCOUNTER — Other Ambulatory Visit: Payer: Self-pay | Admitting: Family Medicine

## 2020-07-01 DIAGNOSIS — I5032 Chronic diastolic (congestive) heart failure: Secondary | ICD-10-CM

## 2020-07-01 DIAGNOSIS — I10 Essential (primary) hypertension: Secondary | ICD-10-CM

## 2020-07-01 MED FILL — ADVAIR 100/50 DISKUS: 100-50 | 30 days supply | Qty: 60 | Fill #3

## 2020-07-01 MED FILL — SPIRONOLACTONE 25 MG TABLET: 25 | 30 days supply | Qty: 30 | Fill #0

## 2020-07-01 MED FILL — FUROSEMIDE 40 MG TAB: 40 | 90 days supply | Qty: 90 | Fill #0

## 2020-07-01 MED FILL — ALBUTEROL SULFATE HFA 108 (: 108 (90 BAS | 16 days supply | Qty: 18 | Fill #1

## 2020-07-01 NOTE — Telephone Encounter (Signed)
Done

## 2020-07-02 ENCOUNTER — Ambulatory Visit: Payer: Medicare HMO

## 2020-07-04 ENCOUNTER — Ambulatory Visit: Payer: Medicare HMO

## 2020-07-08 ENCOUNTER — Ambulatory Visit (HOSPITAL_COMMUNITY): Admission: RE | Admit: 2020-07-08 | Payer: Medicare HMO | Source: Ambulatory Visit

## 2020-07-18 ENCOUNTER — Ambulatory Visit: Payer: Medicare HMO

## 2020-07-23 ENCOUNTER — Ambulatory Visit: Payer: Medicare HMO | Attending: Family Medicine | Admitting: Family Medicine

## 2020-07-23 ENCOUNTER — Other Ambulatory Visit: Payer: Self-pay
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
## 2020-09-01 MED FILL — ADVAIR 100/50 DISKUS: 100-50 | 30 days supply | Qty: 60 | Fill #0

## 2020-09-01 MED FILL — LISINOPRIL 40 MG TABLET: 40 | 90 days supply | Qty: 90 | Fill #1

## 2020-09-01 MED FILL — AMLODIPINE BESYLATE 10 MG T: 10 | 30 days supply | Qty: 30 | Fill #0

## 2020-09-01 MED FILL — SPIRONOLACTONE 25 MG TABLET: 25 | 30 days supply | Qty: 30 | Fill #1

## 2020-09-01 MED FILL — FUROSEMIDE 40 MG TAB: 40 | 90 days supply | Qty: 90 | Fill #0

## 2020-09-01 MED FILL — ALBUTEROL SULFATE HFA 108 (: 108 (90 BAS | 16 days supply | Qty: 18 | Fill #2

## 2020-09-25 ENCOUNTER — Ambulatory Visit: Payer: Medicare HMO | Attending: Physician Assistant | Admitting: Physician Assistant

## 2020-09-25 ENCOUNTER — Other Ambulatory Visit: Payer: Self-pay | Admitting: Physician Assistant

## 2020-09-25 ENCOUNTER — Other Ambulatory Visit: Payer: Self-pay

## 2020-09-25 DIAGNOSIS — I5032 Chronic diastolic (congestive) heart failure: Secondary | ICD-10-CM

## 2020-09-25 DIAGNOSIS — M48061 Spinal stenosis, lumbar region without neurogenic claudication: Secondary | ICD-10-CM

## 2020-09-25 DIAGNOSIS — M5416 Radiculopathy, lumbar region: Secondary | ICD-10-CM | POA: Diagnosis not present

## 2020-09-25 DIAGNOSIS — I10 Essential (primary) hypertension: Secondary | ICD-10-CM

## 2020-09-25 DIAGNOSIS — J438 Other emphysema: Secondary | ICD-10-CM | POA: Diagnosis not present

## 2020-09-25 MED ORDER — IPRATROPIUM-ALBUTEROL 0.5-2.5 (3) MG/3ML IN SOLN: 3.0000 mL | Freq: Four times a day (QID) | RESPIRATORY_TRACT | 11 refills | Status: DC | PRN

## 2020-09-25 MED ORDER — IBUPROFEN 600 MG PO TABS: 600.0000 mg | ORAL_TABLET | Freq: Three times a day (TID) | ORAL | 0 refills | Status: DC | PRN

## 2020-09-25 MED ORDER — DICLOFENAC SODIUM 1 % EX GEL: 4.0000 g | Freq: Four times a day (QID) | CUTANEOUS | 1 refills | Status: DC

## 2020-09-25 MED ORDER — FUROSEMIDE 40 MG PO TABS: 40.0000 mg | ORAL_TABLET | Freq: Every day | ORAL | 0 refills | Status: DC

## 2020-09-25 MED ORDER — LISINOPRIL 40 MG PO TABS: 40.0000 mg | ORAL_TABLET | Freq: Every day | ORAL | 1 refills | Status: DC

## 2020-09-25 MED FILL — IBUPROFEN 600 MG TABLET: 600 | 20 days supply | Qty: 60 | Fill #0

## 2020-09-25 MED FILL — IPRAT-ALBUT 0.5-3(2.5) MG/3: 0.5-2.5 (3) | 20 days supply | Qty: 360 | Fill #0

## 2020-09-25 MED FILL — DICLOFENAC SODIUM 1% GEL: 1 | 25 days supply | Qty: 100 | Fill #0

## 2020-09-25 NOTE — Progress Notes (Signed)
Virtual Visit via Telephone Note  I connected with Shari Obrien on 09/25/20 at  1:30 PM EDT by telephone and verified that I am speaking with the correct person using two identifiers.  Location: Patient: Shari Obrien Provider: Georgian Co, PA-C   I discussed the limitations, risks, security and privacy concerns of performing an evaluation and management service by telephone and the availability of in person appointments. I also discussed with the patient that there may be a patient responsible charge related to this service. The patient expressed understanding and agreed to proceed.  PATIENT visit by telephone virtually in the context of Covid-19 pandemic. Patient location:  home My Location:  [facility] office Persons on the call:  Me and the patient   History of Present Illness:  Patient  Still having back pain and breakthrough pain bc only taking tylenol #3 every 12 hrs.  Needs referral to pain management and also refills on a few meds.  No new s/sx.      Observations/Objective:  NAD.  A&Ox3   Assessment and Plan:   1. Essential hypertension - lisinopril (ZESTRIL) 40 MG tablet; Take 1 tablet (40 mg total) by mouth daily. To lower blood pressure  Dispense: 90 tablet; Refill: 1 - furosemide (LASIX) 40 MG tablet; Take 1 tablet (40 mg total) by mouth daily.  Dispense: 90 tablet; Refill: 0  2. Chronic diastolic heart failure (HCC) - furosemide (LASIX) 40 MG tablet; Take 1 tablet (40 mg total) by mouth daily.  Dispense: 90 tablet; Refill: 0  3. Lumbar radiculopathy - Ambulatory referral to Pain Clinic  4. Spinal stenosis of lumbar region, unspecified whether neurogenic claudication present - Ambulatory referral to Pain Clinic - ibuprofen (ADVIL) 600 MG tablet; Take 1 tablet (600 mg total) by mouth every 8 (eight) hours as needed.  Dispense: 60 tablet; Refill: 0 - diclofenac Sodium (VOLTAREN) 1 % GEL; Apply 4 g topically 4 (four) times daily.  Dispense: 100 g; Refill: 1  5. Other  emphysema (HCC) - ipratropium-albuterol (DUONEB) 0.5-2.5 (3) MG/3ML SOLN; Take 3 mLs by nebulization every 6 (six) hours as needed.  Dispense: 360 mL; Refill: 11    Follow Up Instructions: See PCP in 2-3 months   I discussed the assessment and treatment plan with the patient. The patient was provided an opportunity to ask questions and all were answered. The patient agreed with the plan and demonstrated an understanding of the instructions.   The patient was advised to call back or seek an in-person evaluation if the symptoms worsen or if the condition fails to improve as anticipated.  I provided 17 minutes of non-face-to-face time during this encounter.   Georgian Co, PA-C  Patient ID: Shari Obrien, female   DOB: Nov 21, 1956, 64 y.o.   MRN: [id number]

## 2020-10-07 MED FILL — ALBUTEROL SULFATE HFA 108 (: 108 (90 BAS | 16 days supply | Qty: 18 | Fill #3

## 2020-10-07 MED FILL — ADVAIR 100/50 DISKUS: 100-50 | 30 days supply | Qty: 60 | Fill #1

## 2020-10-07 MED FILL — AMLODIPINE BESYLATE 10 MG T: 10 | 30 days supply | Qty: 30 | Fill #1

## 2020-10-08 DIAGNOSIS — Z1331 Encounter for screening for depression: Secondary | ICD-10-CM | POA: Diagnosis not present

## 2020-10-08 DIAGNOSIS — F17210 Nicotine dependence, cigarettes, uncomplicated: Secondary | ICD-10-CM | POA: Diagnosis not present

## 2020-10-08 DIAGNOSIS — M129 Arthropathy, unspecified: Secondary | ICD-10-CM | POA: Diagnosis not present

## 2020-10-08 DIAGNOSIS — M546 Pain in thoracic spine: Secondary | ICD-10-CM | POA: Diagnosis not present

## 2020-10-08 DIAGNOSIS — E559 Vitamin D deficiency, unspecified: Secondary | ICD-10-CM | POA: Diagnosis not present

## 2020-10-08 DIAGNOSIS — M545 Low back pain, unspecified: Secondary | ICD-10-CM | POA: Diagnosis not present

## 2020-10-08 DIAGNOSIS — Z1159 Encounter for screening for other viral diseases: Secondary | ICD-10-CM | POA: Diagnosis not present

## 2020-10-08 DIAGNOSIS — Z79899 Other long term (current) drug therapy: Secondary | ICD-10-CM | POA: Diagnosis not present

## 2020-10-08 DIAGNOSIS — M542 Cervicalgia: Secondary | ICD-10-CM | POA: Diagnosis not present

## 2020-10-08 DIAGNOSIS — Z1339 Encounter for screening examination for other mental health and behavioral disorders: Secondary | ICD-10-CM | POA: Diagnosis not present

## 2020-10-21 DIAGNOSIS — M545 Low back pain, unspecified: Secondary | ICD-10-CM | POA: Diagnosis not present

## 2020-10-21 DIAGNOSIS — Z6841 Body Mass Index (BMI) 40.0 and over, adult: Secondary | ICD-10-CM | POA: Diagnosis not present

## 2020-10-21 DIAGNOSIS — Z79899 Other long term (current) drug therapy: Secondary | ICD-10-CM | POA: Diagnosis not present

## 2020-10-21 DIAGNOSIS — I10 Essential (primary) hypertension: Secondary | ICD-10-CM | POA: Diagnosis not present

## 2020-11-04 MED FILL — SPIRONOLACTONE 25 MG TABLET: 25 | 30 days supply | Qty: 30 | Fill #2

## 2020-11-04 MED FILL — ALBUTEROL SULFATE HFA 108 (: 108 (90 BAS | 16 days supply | Qty: 18 | Fill #3

## 2020-11-04 MED FILL — AMLODIPINE BESYLATE 10 MG T: 10 | 30 days supply | Qty: 30 | Fill #1

## 2020-11-04 MED FILL — ADVAIR 100/50 DISKUS: 100-50 | 30 days supply | Qty: 60 | Fill #1

## 2020-12-03 MED FILL — LISINOPRIL 40 MG TABLET: 40 | 90 days supply | Qty: 90 | Fill #0

## 2020-12-03 MED FILL — SPIRONOLACTONE 25 MG TABLET: 25 | 90 days supply | Qty: 90 | Fill #3

## 2020-12-03 MED FILL — AMLODIPINE BESYLATE 10 MG T: 10 | 90 days supply | Qty: 90 | Fill #2

## 2020-12-03 MED FILL — FUROSEMIDE 40 MG TAB: 40 | 90 days supply | Qty: 90 | Fill #0

## 2020-12-03 MED FILL — ADVAIR 100/50 DISKUS: 100-50 | 30 days supply | Qty: 60 | Fill #2

## 2020-12-03 MED FILL — ALBUTEROL SULFATE HFA 108 (: 108 (90 BAS | 16 days supply | Qty: 18 | Fill #4

## 2020-12-17 DIAGNOSIS — Z79899 Other long term (current) drug therapy: Secondary | ICD-10-CM | POA: Diagnosis not present

## 2020-12-17 DIAGNOSIS — I10 Essential (primary) hypertension: Secondary | ICD-10-CM | POA: Diagnosis not present

## 2020-12-17 DIAGNOSIS — Z6841 Body Mass Index (BMI) 40.0 and over, adult: Secondary | ICD-10-CM | POA: Diagnosis not present

## 2020-12-17 DIAGNOSIS — M545 Low back pain, unspecified: Secondary | ICD-10-CM | POA: Diagnosis not present

## 2020-12-17 DIAGNOSIS — R6889 Other general symptoms and signs: Secondary | ICD-10-CM | POA: Diagnosis not present

## 2021-03-03 ENCOUNTER — Other Ambulatory Visit: Payer: Self-pay | Admitting: Physician Assistant

## 2021-03-03 ENCOUNTER — Other Ambulatory Visit: Payer: Self-pay

## 2021-03-03 DIAGNOSIS — I5032 Chronic diastolic (congestive) heart failure: Secondary | ICD-10-CM

## 2021-03-03 DIAGNOSIS — I10 Essential (primary) hypertension: Secondary | ICD-10-CM

## 2021-03-03 MED FILL — Fluticasone-Salmeterol Aer Powder BA 100-50 MCG/ACT: RESPIRATORY_TRACT | 30 days supply | Qty: 60 | Fill #0 | Status: AC

## 2021-03-03 MED FILL — Amlodipine Besylate Tab 10 MG (Base Equivalent): ORAL | 30 days supply | Qty: 30 | Fill #0 | Status: AC

## 2021-03-03 MED FILL — Albuterol Sulfate Inhal Aero 108 MCG/ACT (90MCG Base Equiv): RESPIRATORY_TRACT | 16 days supply | Qty: 18 | Fill #0 | Status: AC

## 2021-03-03 MED FILL — Lisinopril Tab 40 MG: ORAL | 30 days supply | Qty: 30 | Fill #0 | Status: AC

## 2021-03-03 MED FILL — Spironolactone Tab 25 MG: ORAL | 30 days supply | Qty: 30 | Fill #0 | Status: AC

## 2021-03-03 NOTE — Telephone Encounter (Signed)
Requested medication (s) are due for refill today: yes  Requested medication (s) are on the active medication list: yes  Last refill: 11/23/2020  Future visit scheduled: no  Notes to clinic:  overdue for follow up  Last appt was 09-25-2020   Requested Prescriptions  Pending Prescriptions Disp Refills   furosemide (LASIX) 40 MG tablet 90 tablet 0    Sig: TAKE 1 TABLET (40 MG TOTAL) BY MOUTH DAILY.      Cardiovascular:  Diuretics - Loop Failed - 03/03/2021 10:49 AM      Failed - Na in normal range and within 360 days    Sodium  Date Value Ref Range Status  05/14/2020 146 (H) 134 - 144 mmol/L Final          Failed - Cr in normal range and within 360 days    Creat  Date Value Ref Range Status  01/19/2016 0.84 0.50 - 0.99 mg/dL Final   Creatinine, Ser  Date Value Ref Range Status  05/14/2020 1.06 (H) 0.57 - 1.00 mg/dL Final          Passed - K in normal range and within 360 days    Potassium  Date Value Ref Range Status  05/14/2020 4.5 3.5 - 5.2 mmol/L Final          Passed - Ca in normal range and within 360 days    Calcium  Date Value Ref Range Status  05/14/2020 9.2 8.7 - 10.3 mg/dL Final   Calcium, Ion  Date Value Ref Range Status  05/15/2008 0.97 (L)  Final          Passed - Last BP in normal range    BP Readings from Last 1 Encounters:  05/14/20 117/74          Passed - Valid encounter within last 6 months    Recent Outpatient Visits           5 months ago Lumbar radiculopathy   [facility] [place], [place], [place]   7 months ago Erroneous encounter - disregard   [facility] [place], Odette Horns, [place]   9 months ago Colon cancer screening   [facility] [place], Odette Horns, [place]   1 year ago Cellulitis of finger, unspecified laterality   [facility] Hoy Register, [place]   1 year ago Lumbar radiculopathy   [facility] Hoy Register, MD

## 2021-03-04 ENCOUNTER — Other Ambulatory Visit: Payer: Self-pay

## 2021-03-04 MED ORDER — FUROSEMIDE 40 MG PO TABS
40.0000 mg | ORAL_TABLET | Freq: Every day | ORAL | 0 refills | Status: DC
  Filled 2021-03-04 – 2021-03-18 (×3): qty 30, 30d supply, fill #0

## 2021-03-11 ENCOUNTER — Other Ambulatory Visit: Payer: Self-pay

## 2021-03-18 ENCOUNTER — Other Ambulatory Visit: Payer: Self-pay

## 2021-03-25 ENCOUNTER — Emergency Department (HOSPITAL_COMMUNITY)
Admission: EM | Admit: 2021-03-25 | Discharge: 2021-03-25 | Disposition: A | Discharge status: Home / Self Care | Payer: Medicare HMO | Attending: Emergency Medicine | Admitting: Emergency Medicine

## 2021-03-25 ENCOUNTER — Emergency Department (HOSPITAL_COMMUNITY): Payer: Medicare HMO

## 2021-03-25 ENCOUNTER — Other Ambulatory Visit: Payer: Self-pay

## 2021-03-25 DIAGNOSIS — Z7951 Long term (current) use of inhaled steroids: Secondary | ICD-10-CM | POA: Diagnosis not present

## 2021-03-25 DIAGNOSIS — F17210 Nicotine dependence, cigarettes, uncomplicated: Secondary | ICD-10-CM | POA: Insufficient documentation

## 2021-03-25 DIAGNOSIS — I110 Hypertensive heart disease with heart failure: Secondary | ICD-10-CM | POA: Insufficient documentation

## 2021-03-25 DIAGNOSIS — J449 Chronic obstructive pulmonary disease, unspecified: Secondary | ICD-10-CM | POA: Insufficient documentation

## 2021-03-25 DIAGNOSIS — Z7982 Long term (current) use of aspirin: Secondary | ICD-10-CM | POA: Diagnosis not present

## 2021-03-25 DIAGNOSIS — Z79899 Other long term (current) drug therapy: Secondary | ICD-10-CM | POA: Insufficient documentation

## 2021-03-25 DIAGNOSIS — M7989 Other specified soft tissue disorders: Secondary | ICD-10-CM | POA: Diagnosis not present

## 2021-03-25 DIAGNOSIS — M79671 Pain in right foot: Secondary | ICD-10-CM | POA: Insufficient documentation

## 2021-03-25 DIAGNOSIS — I5030 Unspecified diastolic (congestive) heart failure: Secondary | ICD-10-CM | POA: Insufficient documentation

## 2021-03-25 DIAGNOSIS — M7731 Calcaneal spur, right foot: Secondary | ICD-10-CM | POA: Diagnosis not present

## 2021-03-25 MED ORDER — COLCHICINE 0.6 MG PO TABS
ORAL_TABLET | ORAL | 0 refills | Status: DC
  Filled 2021-03-25: qty 5, fill #0
  Filled 2021-03-26: qty 5, 4d supply, fill #0

## 2021-03-25 MED ORDER — IBUPROFEN 400 MG PO TABS
600.0000 mg | ORAL_TABLET | Freq: Once | ORAL | Status: AC
  Administered 2021-03-25: 600 mg via ORAL
  Filled 2021-03-25: qty 1

## 2021-03-25 MED ORDER — CEPHALEXIN 500 MG PO CAPS
500.0000 mg | ORAL_CAPSULE | Freq: Three times a day (TID) | ORAL | 0 refills | Status: AC
  Filled 2021-03-25: qty 21, 7d supply, fill #0

## 2021-03-25 MED ORDER — ACETAMINOPHEN 325 MG PO TABS
650.0000 mg | ORAL_TABLET | Freq: Once | ORAL | Status: AC
  Administered 2021-03-25: 650 mg via ORAL
  Filled 2021-03-25: qty 2
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
## 2021-03-25 NOTE — ED Triage Notes (Signed)
Pt reports R foot pain and swelling x 2 weeks. Sts same thing happened last year to both legs and was prescribed abx at that time, so has been taking leftover abx from one year ago to help with this pain without relief. OTC medications not helping.

## 2021-03-25 NOTE — ED Notes (Signed)
Pt in XR. 

## 2021-03-25 NOTE — Discharge Instructions (Addendum)
Call your primary care doctor or specialist as discussed in the next 2-3 days.   Return immediately back to the ER if:  Your symptoms worsen within the next 12-24 hours. You develop new symptoms such as new fevers, persistent vomiting, new pain, shortness of breath, or new weakness or numbness, or if you have any other concerns.  

## 2021-03-26 ENCOUNTER — Other Ambulatory Visit: Payer: Self-pay

## 2021-03-26 ENCOUNTER — Other Ambulatory Visit: Payer: Self-pay | Admitting: Family Medicine

## 2021-03-26 NOTE — Telephone Encounter (Signed)
Requested medication (s) are due for refill today: tapered dose   Requested medication (s) are on the active medication list: yes   Last refill:  03/25/21 #5 0 refills   Future visit scheduled: no   Notes to clinic:  tapered dose. Pharmacy comment: HOSPITAL MD not responding, can you resend for [facility] (ins preference)?     Requested Prescriptions  Pending Prescriptions Disp Refills   colchicine 0.6 MG tablet 5 tablet 0    Sig: Take 2 tablets by mouth on day 1.  Then take 1 tablet a day for the next 5 days.      Endocrinology:  Gout Agents Failed - 03/26/2021  2:38 PM      Failed - Uric Acid in normal range and within 360 days    No results found for: POCURA, LABURIC        Failed - Cr in normal range and within 360 days    Creat  Date Value Ref Range Status  01/19/2016 0.84 0.50 - 0.99 mg/dL Final   Creatinine, Ser  Date Value Ref Range Status  05/14/2020 1.06 (H) 0.57 - 1.00 mg/dL Final          Passed - Valid encounter within last 12 months    Recent Outpatient Visits           6 months ago Lumbar radiculopathy   [facility] [place], [place], [place]   8 months ago Erroneous encounter - disregard   [facility] [place], Odette Horns, MD   10 months ago Colon cancer screening   [place] [facility] Hoy Register, MD   1 year ago Cellulitis of finger, unspecified laterality   [facility] Hoy Register, MD   1 year ago Lumbar radiculopathy   [facility] Hoy Register, MD

## 2021-03-27 ENCOUNTER — Other Ambulatory Visit: Payer: Self-pay

## 2021-03-27 MED ORDER — COLCHICINE 0.6 MG PO CAPS
ORAL_CAPSULE | ORAL | 0 refills | Status: DC
  Filled 2021-03-27 – 2021-04-22 (×2): qty 30, 29d supply, fill #0

## 2021-04-03 ENCOUNTER — Other Ambulatory Visit: Payer: Self-pay

## 2021-04-22 ENCOUNTER — Other Ambulatory Visit: Payer: Self-pay

## 2021-04-22 ENCOUNTER — Other Ambulatory Visit: Payer: Self-pay | Admitting: Family Medicine

## 2021-04-22 DIAGNOSIS — J438 Other emphysema: Secondary | ICD-10-CM

## 2021-04-22 DIAGNOSIS — I10 Essential (primary) hypertension: Secondary | ICD-10-CM

## 2021-04-22 DIAGNOSIS — I5032 Chronic diastolic (congestive) heart failure: Secondary | ICD-10-CM

## 2021-04-22 MED ORDER — FLUTICASONE-SALMETEROL 100-50 MCG/ACT IN AEPB
1.0000 | INHALATION_SPRAY | Freq: Two times a day (BID) | RESPIRATORY_TRACT | 0 refills | Status: DC
Start: 2021-04-22 — End: 2021-05-29
  Filled 2021-04-22: qty 60, 30d supply, fill #0

## 2021-04-22 MED FILL — Lisinopril Tab 40 MG: ORAL | 30 days supply | Qty: 30 | Fill #1 | Status: AC

## 2021-04-22 MED FILL — Albuterol Sulfate Inhal Aero 108 MCG/ACT (90MCG Base Equiv): RESPIRATORY_TRACT | 16 days supply | Qty: 18 | Fill #1 | Status: AC

## 2021-04-22 MED FILL — Amlodipine Besylate Tab 10 MG (Base Equivalent): ORAL | 30 days supply | Qty: 30 | Fill #1 | Status: AC

## 2021-04-22 NOTE — Telephone Encounter (Signed)
Future visit in 2 months  

## 2021-04-22 NOTE — Telephone Encounter (Signed)
Notes to clinic:  Patient was schedule for appt in August  Review to fill enough medication until that time    Requested Prescriptions  Pending Prescriptions Disp Refills   furosemide (LASIX) 40 MG tablet 30 tablet 0    Sig: Take 1 tablet (40 mg total) by mouth daily.      Cardiovascular:  Diuretics - Loop Failed - 04/22/2021 10:55 AM      Failed - Na in normal range and within 360 days    Sodium  Date Value Ref Range Status  05/14/2020 146 (H) 134 - 144 mmol/L Final          Failed - Cr in normal range and within 360 days    Creat  Date Value Ref Range Status  01/19/2016 0.84 0.50 - 0.99 mg/dL Final   Creatinine, Ser  Date Value Ref Range Status  05/14/2020 1.06 (H) 0.57 - 1.00 mg/dL Final          Failed - Last BP in normal range    BP Readings from Last 1 Encounters:  03/25/21 140/88          Failed - Valid encounter within last 6 months    Recent Outpatient Visits           6 months ago Lumbar radiculopathy   [facility] [place], [place], [place]   9 months ago Erroneous encounter - disregard   [place] [facility] [place], Odette Horns, MD   11 months ago Colon cancer screening   [place] [facility] [place], Odette Horns, MD   1 year ago Cellulitis of finger, unspecified laterality   [place] [facility] Hoy Register, MD   1 year ago Lumbar radiculopathy   [place] [facility] [place], Odette Horns, MD       Future Appointments             In 2 months Hoy Register, MD [facility]             Passed - K in normal range and within 360 days    Potassium  Date Value Ref Range Status  05/14/2020 4.5 3.5 - 5.2 mmol/L Final          Passed - Ca in normal range and within 360 days    Calcium  Date Value Ref Range Status  05/14/2020 9.2 8.7 - 10.3 mg/dL Final   Calcium, Ion  Date Value Ref Range Status  05/15/2008  0.97 (L)  Final           Signed Prescriptions Disp Refills   fluticasone-salmeterol (ADVAIR) 100-50 MCG/ACT AEPB 60 each 0    Sig: Inhale 1 puff into the lungs 2 (two) times daily.      Pulmonology:  Combination Products Passed - 04/22/2021 10:55 AM      Passed - Valid encounter within last 12 months    Recent Outpatient Visits           6 months ago Lumbar radiculopathy   [facility] [place], [place], [place]   9 months ago Erroneous encounter - disregard   [place] [facility] Hoy Register, MD   11 months ago Colon cancer screening    [facility]   1 year ago Cellulitis of finger, unspecified laterality   [facility] Hoy Register, MD  1 year ago Lumbar radiculopathy   [place] [facility] Hoy Register, MD       Future Appointments             In 2 months Hoy Register, MD [facility]

## 2021-04-22 NOTE — Telephone Encounter (Signed)
Pt called back to schedule appt for med refills. She scheduled the next available appt with PCP which is not until august however she is out of her medication and needs refills to last her until her scheduled time.  Listed below are all of the meds she needs refilled:   furosemide (LASIX) 40 MG tablet  Fluticasone-Salmeterol (ADVAIR) 100-50 MCG/DOSE AEPB  albuterol (VENTOLIN HFA) 108 (90 Base) MCG/ACT inhaler  amLODipine (NORVASC) 10 MG tablet lisinopril (ZESTRIL) 40 MG tablet  [facility]  [address] [place] [address]  Phone: [phone] Fax: [phone]

## 2021-04-23 ENCOUNTER — Other Ambulatory Visit: Payer: Self-pay

## 2021-04-23 MED ORDER — FUROSEMIDE 40 MG PO TABS
40.0000 mg | ORAL_TABLET | Freq: Every day | ORAL | 1 refills | Status: DC
  Filled 2021-04-23: qty 30, 30d supply, fill #0
  Filled 2021-05-29: qty 30, 30d supply, fill #1

## 2021-04-24 ENCOUNTER — Other Ambulatory Visit: Payer: Self-pay

## 2021-04-27 ENCOUNTER — Other Ambulatory Visit: Payer: Self-pay

## 2021-04-28 ENCOUNTER — Other Ambulatory Visit: Payer: Self-pay

## 2021-05-12 ENCOUNTER — Other Ambulatory Visit: Payer: Self-pay

## 2021-05-12 DIAGNOSIS — Z7982 Long term (current) use of aspirin: Secondary | ICD-10-CM | POA: Diagnosis not present

## 2021-05-12 DIAGNOSIS — I5030 Unspecified diastolic (congestive) heart failure: Secondary | ICD-10-CM | POA: Insufficient documentation

## 2021-05-12 DIAGNOSIS — I110 Hypertensive heart disease with heart failure: Secondary | ICD-10-CM | POA: Insufficient documentation

## 2021-05-12 DIAGNOSIS — M79601 Pain in right arm: Secondary | ICD-10-CM | POA: Diagnosis not present

## 2021-05-12 DIAGNOSIS — R600 Localized edema: Secondary | ICD-10-CM | POA: Insufficient documentation

## 2021-05-12 DIAGNOSIS — F17210 Nicotine dependence, cigarettes, uncomplicated: Secondary | ICD-10-CM | POA: Insufficient documentation

## 2021-05-12 DIAGNOSIS — M79621 Pain in right upper arm: Secondary | ICD-10-CM | POA: Diagnosis not present

## 2021-05-12 DIAGNOSIS — M7989 Other specified soft tissue disorders: Secondary | ICD-10-CM | POA: Diagnosis not present

## 2021-05-12 DIAGNOSIS — Z79899 Other long term (current) drug therapy: Secondary | ICD-10-CM | POA: Diagnosis not present

## 2021-05-12 DIAGNOSIS — M79631 Pain in right forearm: Secondary | ICD-10-CM | POA: Diagnosis not present

## 2021-05-12 DIAGNOSIS — J449 Chronic obstructive pulmonary disease, unspecified: Secondary | ICD-10-CM | POA: Diagnosis not present

## 2021-05-13 ENCOUNTER — Other Ambulatory Visit: Payer: Self-pay

## 2021-05-13 ENCOUNTER — Ambulatory Visit (HOSPITAL_COMMUNITY): Admission: RE | Admit: 2021-05-13 | Payer: Medicare HMO | Source: Ambulatory Visit

## 2021-05-13 ENCOUNTER — Telehealth: Payer: Self-pay

## 2021-05-13 ENCOUNTER — Emergency Department (HOSPITAL_COMMUNITY): Payer: Medicare HMO

## 2021-05-13 ENCOUNTER — Emergency Department (HOSPITAL_COMMUNITY)
Admission: EM | Admit: 2021-05-13 | Discharge: 2021-05-13 | Disposition: A | Discharge status: Home / Self Care | Payer: Medicare HMO | Attending: Emergency Medicine | Admitting: Emergency Medicine

## 2021-05-13 ENCOUNTER — Encounter (HOSPITAL_COMMUNITY): Payer: Self-pay | Admitting: Emergency Medicine

## 2021-05-13 DIAGNOSIS — M79601 Pain in right arm: Secondary | ICD-10-CM | POA: Diagnosis not present

## 2021-05-13 DIAGNOSIS — M79631 Pain in right forearm: Secondary | ICD-10-CM | POA: Diagnosis not present

## 2021-05-13 DIAGNOSIS — M79621 Pain in right upper arm: Secondary | ICD-10-CM | POA: Diagnosis not present

## 2021-05-13 DIAGNOSIS — M7989 Other specified soft tissue disorders: Secondary | ICD-10-CM | POA: Diagnosis not present

## 2021-05-13 MED ORDER — RIVAROXABAN 15 MG PO TABS
15.0000 mg | ORAL_TABLET | Freq: Once | ORAL | Status: AC
  Administered 2021-05-13: 15 mg via ORAL
  Filled 2021-05-13: qty 1

## 2021-05-13 MED ORDER — OXYCODONE-ACETAMINOPHEN 5-325 MG PO TABS
1.0000 | ORAL_TABLET | ORAL | 0 refills | Status: DC | PRN
  Filled 2021-05-13: qty 20, 4d supply, fill #0

## 2021-05-13 MED ORDER — OXYCODONE-ACETAMINOPHEN 5-325 MG PO TABS
1.0000 | ORAL_TABLET | Freq: Once | ORAL | Status: AC
Start: 2021-05-13 — End: 2021-05-13
  Administered 2021-05-13: 1 via ORAL
  Filled 2021-05-13: qty 1
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
## 2021-05-13 NOTE — Discharge Instructions (Addendum)
I am concerned that your arm pain and swelling is from a blood clot in your arm.  Please go to [facility] in the morning for a vascular ultrasound to see if there is indeed a clot present.  If there is, you will need to get a prescription for additional blood thinners and will need to follow-up with the vascular surgeon.  If the ultrasound does not show presence of a clot, you will need to follow-up with your primary care provider for additional evaluation, or return to the emergency department.

## 2021-05-13 NOTE — ED Triage Notes (Signed)
Pt arriving with right arm pain. Pt states pain is mostly in her shoulder and elbow. Pt is unable to lift right arm. Obvious swelling noted.

## 2021-05-13 NOTE — ED Notes (Signed)
Patient transported to X-ray 

## 2021-05-13 NOTE — Telephone Encounter (Signed)
Patient calls today to get pain medicine because ED provider sent it to [facility] They are unable to fill it as they do not fill controlled substances. Advised patient to call [facility] ED to get prescription resent, added [facility] on [address] per patient request. I called ED on patient behalf to request transfer, unable to get through.

## 2021-05-20 ENCOUNTER — Ambulatory Visit (HOSPITAL_COMMUNITY): Payer: Medicare HMO

## 2021-05-29 ENCOUNTER — Other Ambulatory Visit: Payer: Self-pay | Admitting: Family Medicine

## 2021-05-29 ENCOUNTER — Other Ambulatory Visit: Payer: Self-pay

## 2021-05-29 DIAGNOSIS — J438 Other emphysema: Secondary | ICD-10-CM

## 2021-05-29 DIAGNOSIS — I10 Essential (primary) hypertension: Secondary | ICD-10-CM

## 2021-05-29 MED ORDER — FLUTICASONE-SALMETEROL 100-50 MCG/ACT IN AEPB
1.0000 | INHALATION_SPRAY | Freq: Two times a day (BID) | RESPIRATORY_TRACT | 0 refills | Status: DC
  Filled 2021-05-29 (×2): qty 60, 30d supply, fill #0

## 2021-05-29 MED ORDER — AMLODIPINE BESYLATE 10 MG PO TABS
10.0000 mg | ORAL_TABLET | Freq: Every day | ORAL | 0 refills | Status: DC
  Filled 2021-05-29: qty 30, 30d supply, fill #0

## 2021-05-29 MED ORDER — ALBUTEROL SULFATE HFA 108 (90 BASE) MCG/ACT IN AERS
2.0000 | INHALATION_SPRAY | RESPIRATORY_TRACT | 0 refills | Status: DC | PRN
  Filled 2021-05-29: qty 18, 16d supply, fill #0

## 2021-05-29 MED FILL — Lisinopril Tab 40 MG: ORAL | 30 days supply | Qty: 30 | Fill #2 | Status: AC

## 2021-05-29 NOTE — Telephone Encounter (Signed)
Notes to clinic:  Patient has upcoming appt on 06/24/2021 Review for refill until that time   Requested Prescriptions  Pending Prescriptions Disp Refills   albuterol (VENTOLIN HFA) 108 (90 Base) MCG/ACT inhaler 18 g 6    Sig: INHALE 2 PUFFS INTO THE LUNGS EVERY 4 (FOUR) HOURS AS NEEDED FOR WHEEZING OR SHORTNESS OF BREATH.      Pulmonology:  Beta Agonists Failed - 05/29/2021  8:49 AM      Failed - One inhaler should last at least one month. If the patient is requesting refills earlier, contact the patient to check for uncontrolled symptoms.      Passed - Valid encounter within last 12 months    Recent Outpatient Visits           8 months ago Lumbar radiculopathy   [facility] [place], [place], [place]   10 months ago Erroneous encounter - disregard   [facility] Hoy Register, MD   1 year ago Colon cancer screening   [facility] Hoy Register, MD   1 year ago Cellulitis of finger, unspecified laterality   [facility] Hoy Register, MD   1 year ago Lumbar radiculopathy   [facility] [place], Odette Horns, MD       Future Appointments             In 3 weeks Hoy Register, MD [facility]               amLODipine (NORVASC) 10 MG tablet 30 tablet 6    Sig: TAKE 1 TABLET (10 MG TOTAL) BY MOUTH DAILY.      Cardiovascular:  Calcium Channel Blockers Failed - 05/29/2021  8:49 AM      Failed - Valid encounter within last 6 months    Recent Outpatient Visits           8 months ago Lumbar radiculopathy   [facility] [place], [place], [place]   10 months ago Erroneous encounter - disregard   [place] [facility] Hoy Register, MD   1 year ago Colon cancer screening   [place] [facility] Hoy Register, MD   1 year ago Cellulitis  of finger, unspecified laterality   [place] [facility] Hoy Register, MD   1 year ago Lumbar radiculopathy   [facility] [place], Odette Horns, MD       Future Appointments             In 3 weeks Hoy Register, MD [facility]             Passed - Last BP in normal range    BP Readings from Last 1 Encounters:  05/13/21 124/77            fluticasone-salmeterol (ADVAIR DISKUS) 100-50 MCG/ACT AEPB 60 each 0    Sig: Inhale 1 puff into the lungs 2 (two) times daily.      Pulmonology:  Combination Products Passed - 05/29/2021  8:49 AM      Passed - Valid encounter within last 12 months    Recent Outpatient Visits           8 months ago Lumbar radiculopathy   [facility] [place], [place], [place]   10 months ago  Erroneous encounter - disregard   [facility] Hoy Register, MD   1 year ago Colon cancer screening   [place] [facility] Hoy Register, MD   1 year ago Cellulitis of finger, unspecified laterality   [facility] H[facility], MD   1 year ago Lumbar radiculopathy   [facility] Hoy Register, MD       Future Appointments             In 3 weeks Hoy Register, MD [facility]

## 2021-06-01 ENCOUNTER — Other Ambulatory Visit: Payer: Self-pay

## 2021-06-24 ENCOUNTER — Other Ambulatory Visit: Payer: Self-pay

## 2021-06-24 ENCOUNTER — Ambulatory Visit: Payer: Medicare HMO | Attending: Family Medicine | Admitting: Family Medicine

## 2021-06-24 ENCOUNTER — Encounter: Payer: Self-pay | Admitting: Family Medicine

## 2021-06-24 VITALS — BP 117/77 | HR 92 | Ht 64.0 in | Wt 246.0 lb

## 2021-06-24 DIAGNOSIS — J438 Other emphysema: Secondary | ICD-10-CM

## 2021-06-24 DIAGNOSIS — M48061 Spinal stenosis, lumbar region without neurogenic claudication: Secondary | ICD-10-CM

## 2021-06-24 DIAGNOSIS — I10 Essential (primary) hypertension: Secondary | ICD-10-CM | POA: Diagnosis not present

## 2021-06-24 DIAGNOSIS — R6889 Other general symptoms and signs: Secondary | ICD-10-CM | POA: Diagnosis not present

## 2021-06-24 DIAGNOSIS — E119 Type 2 diabetes mellitus without complications: Secondary | ICD-10-CM

## 2021-06-24 LAB — POCT GLYCOSYLATED HEMOGLOBIN (HGB A1C): HbA1c, POC (controlled diabetic range): 6.8 % (ref 0.0–7.0)

## 2021-06-24 MED ORDER — TRELEGY ELLIPTA 100-62.5-25 MCG/INH IN AEPB
1.0000 | INHALATION_SPRAY | Freq: Every day | RESPIRATORY_TRACT | 6 refills | Status: DC
  Filled 2021-06-24: qty 60, 30d supply, fill #0

## 2021-06-24 MED ORDER — LISINOPRIL 40 MG PO TABS
ORAL_TABLET | ORAL | 1 refills | Status: DC
  Filled 2021-06-24: qty 90, 90d supply, fill #0
  Filled 2021-10-01: qty 90, 90d supply, fill #1

## 2021-06-24 MED ORDER — ACCU-CHEK GUIDE VI STRP
ORAL_STRIP | 12 refills | Status: DC
  Filled 2021-06-24: qty 100, 90d supply, fill #0

## 2021-06-24 MED ORDER — ALBUTEROL SULFATE HFA 108 (90 BASE) MCG/ACT IN AERS
2.0000 | INHALATION_SPRAY | RESPIRATORY_TRACT | 6 refills | Status: DC | PRN
  Filled 2021-06-24: qty 18, 16d supply, fill #0
  Filled 2021-08-04: qty 18, 16d supply, fill #1
  Filled 2021-10-01: qty 18, 16d supply, fill #2
  Filled 2021-11-11: qty 18, 16d supply, fill #3
  Filled 2021-12-22: qty 18, 16d supply, fill #0
  Filled 2021-12-22: qty 18, 16d supply, fill #4
  Filled 2022-01-13: qty 18, 16d supply, fill #1
  Filled 2022-01-18: qty 18, 16d supply, fill #0
  Filled 2022-01-18 – 2022-03-09 (×2): qty 18, 16d supply, fill #1

## 2021-06-24 MED ORDER — SPIRONOLACTONE 25 MG PO TABS
ORAL_TABLET | Freq: Every day | ORAL | 1 refills | Status: DC
  Filled 2021-06-24: qty 90, 90d supply, fill #0
  Filled 2021-10-01: qty 90, 90d supply, fill #1

## 2021-06-24 MED ORDER — TIZANIDINE HCL 4 MG PO TABS
4.0000 mg | ORAL_TABLET | Freq: Three times a day (TID) | ORAL | 6 refills | Status: DC | PRN
  Filled 2021-06-24: qty 90, 30d supply, fill #0

## 2021-06-24 MED ORDER — DULOXETINE HCL 60 MG PO CPEP
60.0000 mg | ORAL_CAPSULE | Freq: Every day | ORAL | 1 refills | Status: DC
  Filled 2021-06-24: qty 90, 90d supply, fill #0

## 2021-06-24 MED ORDER — AMLODIPINE BESYLATE 10 MG PO TABS
10.0000 mg | ORAL_TABLET | Freq: Every day | ORAL | 1 refills | Status: DC
  Filled 2021-06-24: qty 90, 90d supply, fill #0
  Filled 2021-10-01: qty 90, 90d supply, fill #1

## 2021-06-24 MED ORDER — METFORMIN HCL 500 MG PO TABS
500.0000 mg | ORAL_TABLET | Freq: Two times a day (BID) | ORAL | 1 refills | Status: DC
  Filled 2021-06-24: qty 180, 90d supply, fill #0

## 2021-06-24 MED ORDER — ACCU-CHEK GUIDE ME W/DEVICE KIT
1.0000 | PACK | Freq: Every day | 0 refills | Status: DC
  Filled 2021-06-24: qty 1, 30d supply, fill #0
  Filled 2021-06-25 (×2): qty 1, 365d supply, fill #0

## 2021-06-24 NOTE — Patient Instructions (Signed)
[employer] neurological surgery (7th ed., pp. 2373-2383). [place], [place]: [employer]."> [employer] neurological surgery (7th ed., pp. 2344-2347). [place], [place]: [employer].">  Spinal Stenosis  Spinal stenosis is a condition that happens when the spinal canal narrows. The spinal canal is the space between the bones of your spine (vertebrae). This narrowing puts pressure on the spinal cord or nerves. Spinal stenosiscan affect the vertebrae in the neck, upper back, and lower back. This condition can range from mild to severe. In some cases, there are nosymptoms. What are the causes? This condition is caused by areas of bone pushing into the spinal canal. This condition may be present at birth (congenital), or it may be caused by: Slow breakdown of your vertebrae (spinal degeneration). This usually starts around 66 years of age. Injury (trauma) to your spine. Tumors in your spine. Calcium deposits in your spine. What increases the risk? The following factors may make you more likely to develop this condition: Being older than age 50. Having a problem present at birth with an abnormally shaped spine (congenitalspinal deformity), such as scoliosis. Having arthritis. What are the signs or symptoms? Symptoms of this condition include: Pain in the neck or back that is generally worse with activities, particularly when you stand or walk. Numbness, tingling, hot or cold sensations, weakness, or tiredness (fatigue) in your leg or legs. Pain going from the buttock, down the thigh, and to the calf (sciatica). This can happen in one or both legs. Frequent episodes of falling. A foot-slapping gait that leads to muscle weakness. In more severe cases, you may develop: Problems having a bowel movement or urinating. Difficulty having sex. Loss of feeling in your legs and inability to walk. Symptoms may come on slowly and get worse over time. In some cases, there are no symptoms. How is this  diagnosed? This condition is diagnosed based on your medical history and a physical exam.You will also have tests, such as an MRI, a CT scan, or an X-ray. How is this treated? Treatment for this condition often focuses on managing your pain and any other symptoms. Treatment may include: Practicing good posture to lessen pressure on your nerves. Exercising to strengthen muscles, build endurance, improve balance, and maintain range of motion. This may include physical therapy to restore movement and strength to your back. Losing weight, if needed. Medicines to reduce inflammation or pain. This may include a medicine that is injected into your spine (steroidinjection). Assistive devices, such as a corset or brace. In some cases, surgery may be needed. The most common procedure is decompression laminectomy. This is done to remove excess bone that putspressure on your nerve roots. Follow these instructions at home: Managing pain, stiffness, and swelling  Practice good posture. If you were given a brace or a corset, wear it as told by your health care provider. Maintain a healthy weight. Talk with your health care provider if you need help losing weight. If directed, apply heat to the affected area as often as told by your health care provider. Use the heat source that your health care provider recommends, such as a moist heat pack or a heating pad. Place a towel between your skin and the heat source. Leave the heat on for 20-30 minutes. Remove the heat if your skin turns bright red. This is especially important if you are unable to feel pain, heat, or cold. You may have a greater risk of getting burned.  Activity Do all exercises and stretches as told by your   health care provider. Do not do any activities that cause pain. Ask your health care provider what activities are safe for you. Do not lift anything that is heavier than 10 lb (4.5 kg), or the limit that you are told by your health care  provider. Return to your normal activities as told by your health care provider. Ask your health care provider what activities are safe for you. General instructions Take over-the-counter and prescription medicines only as told by your health care provider. Do not use any products that contain nicotine or tobacco, such as cigarettes, e-cigarettes, and chewing tobacco. If you need help quitting, ask your health care provider. Eat a healthy diet. This includes plenty of fruits and vegetables, whole grains, and low-fat (lean) protein. Keep all follow-up visits as told by your health care provider. This is important. Contact a health care provider if: Your symptoms do not get better or they get worse. You have a fever. Get help right away if: You have new pain or symptoms of severe pain, such as: New or worsening pain in your neck or upper back. Severe pain that cannot be controlled with medicines. A severe headache that gets worse when you stand. You are dizzy. You have vision problems, such as blurred vision or double vision. You have nausea or you vomit. You develop new or worsening numbness or tingling in your back or legs. You have pain, redness, swelling, or warmth in your arm or leg. Summary Spinal stenosis is a condition that happens when the spinal canal narrows. The spinal canal is the space between the bones of your spine (vertebrae). This narrowing puts pressure on the spinal cord or nerves. This condition may be caused by a birth defect, breakdown of your vertebrae, trauma, tumors, or calcium deposits. Spinal stenosis can cause numbness, weakness, or pain in the buttocks, neck, back, and legs. This condition is usually diagnosed with your medical history, a physical exam, and tests, such as an MRI, a CT scan, or an X-ray. This information is not intended to replace advice given to you by your health care provider. Make sure you discuss any questions you have with your healthcare  provider. Document Revised: 09/06/2019 Document Reviewed: 09/06/2019 Elsevier Patient Education  2022 [employer]  
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
## 2021-06-24 NOTE — Progress Notes (Signed)
Pain in back.  A1C-6.8

## 2021-06-25 ENCOUNTER — Other Ambulatory Visit: Payer: Self-pay | Admitting: Family Medicine

## 2021-06-25 ENCOUNTER — Other Ambulatory Visit: Payer: Self-pay

## 2021-06-25 LAB — CMP14+EGFR
ALT: 16 IU/L (ref 0–32)
AST: 18 IU/L (ref 0–40)
Albumin/Globulin Ratio: 1.6 (ref 1.2–2.2)
Albumin: 4.4 g/dL (ref 3.8–4.8)
Alkaline Phosphatase: 69 IU/L (ref 44–121)
BUN/Creatinine Ratio: 28 (ref 12–28)
BUN: 30 mg/dL — ABNORMAL HIGH (ref 8–27)
Bilirubin Total: 0.3 mg/dL (ref 0.0–1.2)
CO2: 25 mmol/L (ref 20–29)
Calcium: 9.5 mg/dL (ref 8.7–10.3)
Chloride: 100 mmol/L (ref 96–106)
Creatinine, Ser: 1.06 mg/dL — ABNORMAL HIGH (ref 0.57–1.00)
Globulin, Total: 2.7 g/dL (ref 1.5–4.5)
Glucose: 123 mg/dL — ABNORMAL HIGH (ref 65–99)
Potassium: 4.2 mmol/L (ref 3.5–5.2)
Sodium: 142 mmol/L (ref 134–144)
Total Protein: 7.1 g/dL (ref 6.0–8.5)
eGFR: 58 mL/min/{1.73_m2} — ABNORMAL LOW (ref >59)

## 2021-06-25 LAB — LIPID PANEL
Chol/HDL Ratio: 4 ratio (ref 0.0–4.4)
Cholesterol, Total: 181 mg/dL (ref 100–199)
HDL: 45 mg/dL (ref >39)
LDL Chol Calc (NIH): 120 mg/dL — ABNORMAL HIGH (ref 0–99)
Triglycerides: 89 mg/dL (ref 0–149)
VLDL Cholesterol Cal: 16 mg/dL (ref 5–40)

## 2021-06-25 MED ORDER — ATORVASTATIN CALCIUM 20 MG PO TABS
20.0000 mg | ORAL_TABLET | Freq: Every day | ORAL | 3 refills | Status: DC
  Filled 2021-06-25: qty 30, 30d supply, fill #0

## 2021-07-01 ENCOUNTER — Ambulatory Visit: Payer: Medicare HMO | Admitting: Pharmacist

## 2021-07-02 ENCOUNTER — Ambulatory Visit: Payer: Medicare HMO | Admitting: Pharmacist

## 2021-07-02 ENCOUNTER — Other Ambulatory Visit: Payer: Self-pay

## 2021-07-02 DIAGNOSIS — I10 Essential (primary) hypertension: Secondary | ICD-10-CM

## 2021-07-02 DIAGNOSIS — I5032 Chronic diastolic (congestive) heart failure: Secondary | ICD-10-CM

## 2021-07-03 ENCOUNTER — Other Ambulatory Visit: Payer: Self-pay

## 2021-07-03 MED ORDER — FUROSEMIDE 40 MG PO TABS
40.0000 mg | ORAL_TABLET | Freq: Every day | ORAL | 1 refills | Status: DC
  Filled 2021-07-03 – 2021-07-28 (×2): qty 30, 30d supply, fill #0
  Filled 2021-10-01: qty 30, 30d supply, fill #1

## 2021-07-04 ENCOUNTER — Ambulatory Visit (HOSPITAL_COMMUNITY): Payer: Medicare HMO

## 2021-07-06 ENCOUNTER — Telehealth: Payer: Self-pay

## 2021-07-06 NOTE — Telephone Encounter (Signed)
Patient was called and a voicemail was left informing patient to return phone call for lab results.   CRM created. 

## 2021-07-06 NOTE — Telephone Encounter (Signed)
-----   Message from Hoy Register, MD sent at 06/25/2021  1:15 PM EDT ----- Please inform her that kidney and liver functions are normal.  Cholesterol is slightly elevated and I have placed her on a new medication called atorvastatin.  Compliance with a low-cholesterol diet and exercise will be beneficial.

## 2021-07-07 ENCOUNTER — Ambulatory Visit: Payer: Medicare HMO | Admitting: Pharmacist

## 2021-07-09 ENCOUNTER — Other Ambulatory Visit: Payer: Self-pay

## 2021-07-09 ENCOUNTER — Ambulatory Visit (HOSPITAL_COMMUNITY)
Admission: RE | Admit: 2021-07-09 | Discharge: 2021-07-09 | Disposition: A | Discharge status: Home / Self Care | Payer: Medicare HMO | Source: Ambulatory Visit | Attending: Family Medicine | Admitting: Family Medicine

## 2021-07-09 DIAGNOSIS — R6889 Other general symptoms and signs: Secondary | ICD-10-CM | POA: Diagnosis not present

## 2021-07-09 DIAGNOSIS — M4316 Spondylolisthesis, lumbar region: Secondary | ICD-10-CM | POA: Diagnosis not present

## 2021-07-09 DIAGNOSIS — M5116 Intervertebral disc disorders with radiculopathy, lumbar region: Secondary | ICD-10-CM | POA: Diagnosis not present

## 2021-07-09 DIAGNOSIS — M48061 Spinal stenosis, lumbar region without neurogenic claudication: Secondary | ICD-10-CM | POA: Insufficient documentation

## 2021-07-09 DIAGNOSIS — M5117 Intervertebral disc disorders with radiculopathy, lumbosacral region: Secondary | ICD-10-CM | POA: Diagnosis not present

## 2021-07-13 ENCOUNTER — Other Ambulatory Visit: Payer: Self-pay | Admitting: Family Medicine

## 2021-07-13 DIAGNOSIS — M48061 Spinal stenosis, lumbar region without neurogenic claudication: Secondary | ICD-10-CM

## 2021-07-17 ENCOUNTER — Telehealth: Payer: Self-pay

## 2021-07-17 ENCOUNTER — Other Ambulatory Visit: Payer: Self-pay

## 2021-07-17 NOTE — Telephone Encounter (Signed)
Patient name and DOB has been verified Patient was informed of lab results. Patient had no questions.  

## 2021-07-17 NOTE — Telephone Encounter (Signed)
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  This can explain her lower back symptoms and I have referred her to a spine specialist for further evaluation.

## 2021-07-19 ENCOUNTER — Telehealth: Payer: Self-pay

## 2021-07-19 NOTE — Telephone Encounter (Signed)
Called pt to schedule Annual Wellness Visit, No answer left message to call [facility] to schedule at [phone]  

## 2021-07-22 ENCOUNTER — Encounter: Payer: Self-pay | Admitting: Physical Medicine and Rehabilitation

## 2021-07-22 ENCOUNTER — Other Ambulatory Visit: Payer: Self-pay

## 2021-07-22 ENCOUNTER — Ambulatory Visit (INDEPENDENT_AMBULATORY_CARE_PROVIDER_SITE_OTHER): Payer: Medicare HMO | Admitting: Physical Medicine and Rehabilitation

## 2021-07-22 ENCOUNTER — Telehealth: Payer: Self-pay | Admitting: Physical Medicine and Rehabilitation

## 2021-07-22 VITALS — BP 156/79 | HR 80

## 2021-07-22 DIAGNOSIS — M48062 Spinal stenosis, lumbar region with neurogenic claudication: Secondary | ICD-10-CM | POA: Diagnosis not present

## 2021-07-22 DIAGNOSIS — M47816 Spondylosis without myelopathy or radiculopathy, lumbar region: Secondary | ICD-10-CM | POA: Diagnosis not present

## 2021-07-22 DIAGNOSIS — M4726 Other spondylosis with radiculopathy, lumbar region: Secondary | ICD-10-CM

## 2021-07-22 DIAGNOSIS — R6889 Other general symptoms and signs: Secondary | ICD-10-CM | POA: Diagnosis not present

## 2021-07-22 DIAGNOSIS — M5416 Radiculopathy, lumbar region: Secondary | ICD-10-CM

## 2021-07-22 MED ORDER — DIAZEPAM 5 MG PO TABS: ORAL_TABLET | ORAL | 0 refills | Status: DC
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
## 2021-07-22 NOTE — Progress Notes (Signed)
Pt state lower back pain that travels down both legs, mostly the right. Pt state walking, standing, bending and carrying makes the pain worse. Pt state she takes pain meds and laying down helps ease her pain.  Numeric Pain Rating Scale and Functional Assessment Average Pain 9 Pain Right Now 4 My pain is intermittent, sharp, dull, stabbing, tingling, and aching Pain is worse with: walking, bending, standing, and some activites Pain improves with: rest and medication   In the last MONTH (on 0-10 scale) has pain interfered with the following?  1. General activity like being  able to carry out your everyday physical activities such as walking, climbing stairs, carrying groceries, or moving a chair?  Rating(7)  2. Relation with others like being able to carry out your usual social activities and roles such as  activities at home, at work and in your community. Rating(8)  3. Enjoyment of life such that you have  been bothered by emotional problems such as feeling anxious, depressed or irritable?  Rating(9)

## 2021-07-22 NOTE — Telephone Encounter (Signed)
Needs auth for bilateral L3 TF. Scheduled for 9/8 with driver.

## 2021-07-28 ENCOUNTER — Other Ambulatory Visit: Payer: Self-pay

## 2021-07-29 ENCOUNTER — Ambulatory Visit: Payer: Medicare HMO | Admitting: Family Medicine

## 2021-07-30 ENCOUNTER — Ambulatory Visit: Payer: Medicare HMO | Admitting: Physical Medicine and Rehabilitation

## 2021-08-04 ENCOUNTER — Ambulatory Visit: Payer: Medicare HMO | Attending: Family Medicine | Admitting: Family Medicine

## 2021-08-04 ENCOUNTER — Encounter: Payer: Self-pay | Admitting: Family Medicine

## 2021-08-04 ENCOUNTER — Telehealth: Payer: Self-pay

## 2021-08-04 ENCOUNTER — Other Ambulatory Visit: Payer: Self-pay

## 2021-08-04 VITALS — BP 127/81 | HR 87 | Ht 64.0 in | Wt 237.0 lb

## 2021-08-04 DIAGNOSIS — Z0000 Encounter for general adult medical examination without abnormal findings: Secondary | ICD-10-CM

## 2021-08-04 DIAGNOSIS — E2839 Other primary ovarian failure: Secondary | ICD-10-CM | POA: Diagnosis not present

## 2021-08-04 DIAGNOSIS — Z23 Encounter for immunization: Secondary | ICD-10-CM | POA: Diagnosis not present

## 2021-08-04 DIAGNOSIS — R6889 Other general symptoms and signs: Secondary | ICD-10-CM | POA: Diagnosis not present

## 2021-08-04 DIAGNOSIS — Z1231 Encounter for screening mammogram for malignant neoplasm of breast: Secondary | ICD-10-CM

## 2021-08-04 DIAGNOSIS — Z1211 Encounter for screening for malignant neoplasm of colon: Secondary | ICD-10-CM

## 2021-08-04 NOTE — Progress Notes (Signed)
Subjective:   Shari Obrien is a 65 y.o. female who presents for Medicare Annual (Subsequent) preventive examination.  Review of Systems    General: negative for fever, weight loss, appetite change Eyes: no visual symptoms. ENT: no ear symptoms, no sinus tenderness, no nasal congestion or sore throat. Neck: no pain  Respiratory: no wheezing, shortness of breath, cough Cardiovascular: no chest pain, no dyspnea on exertion, no pedal edema, no orthopnea. Gastrointestinal: no abdominal pain, no diarrhea, no constipation Genito-Urinary: no urinary frequency, no dysuria, no polyuria. Hematologic: no bruising Endocrine: no cold or heat intolerance Neurological: no headaches, no seizures, no tremors Musculoskeletal: + back pain Skin: no pruritus, no rash. Psychological: no depression, no anxiety,         Objective:    Today's Vitals   08/04/21 1014 08/04/21 1015  BP: 127/81   Pulse: 87   SpO2: 95%   Weight: 237 lb (107.5 kg)   Height: _0  (1.626 m)   PainSc:  8    Body mass index is 40.68 kg/m.  Advanced Directives 08/04/2021 05/13/2021 06/08/2020 06/04/2020 05/14/2020 05/24/2019 02/27/2019  Does Patient Have a Medical Advance Directive? _1  No No  Would patient like information on creating a medical advance directive? - - No - Patient declined No - Patient declined - - No - Guardian declined    Current Medications (verified) Outpatient Encounter Medications as of 08/04/2021  Medication Sig   albuterol (VENTOLIN HFA) 108 (90 Base) MCG/ACT inhaler Inhale 2 puffs into the lungs every 4 (four) hours as needed for wheezing or shortness of breath.   amLODipine (NORVASC) 10 MG tablet Take 1 tablet (10 mg total) by mouth daily.   Aspirin-Caffeine (BAYER BACK & BODY PAIN EX ST PO) Take 2 tablets by mouth 2 (two) times a day.   atorvastatin (LIPITOR) 20 MG tablet Take 1 tablet (20 mg total) by mouth daily.   Blood Glucose Monitoring Suppl (ACCU-CHEK GUIDE ME) w/Device KIT Use  as directed daily before breakfast.   Colchicine 0.6 MG CAPS Take 2 capsules by mouth on day 1, then 1 capsule daily for the next 5 days.   diclofenac Sodium (VOLTAREN) 1 % GEL APPLY 4 G TOPICALLY 4 (FOUR) TIMES DAILY.   DULoxetine (CYMBALTA) 60 MG capsule Take 1 capsule (60 mg total) by mouth daily. For back pain   Fluticasone-Umeclidin-Vilant (TRELEGY ELLIPTA) 100-62.5-25 MCG/INH AEPB Inhale 1 puff into the lungs daily.   furosemide (LASIX) 40 MG tablet Take 1 tablet (40 mg total) by mouth daily.   glucose blood (ACCU-CHEK GUIDE) test strip Use as instructed daily   hydrocortisone 2.5 % cream Apply topically 2 (two) times daily.   ibuprofen (ADVIL) 600 MG tablet TAKE 1 TABLET (600 MG TOTAL) BY MOUTH EVERY 8 (EIGHT) HOURS AS NEEDED.   ipratropium-albuterol (DUONEB) 0.5-2.5 (3) MG/3ML SOLN TAKE 3 MLS BY NEBULIZATION EVERY 6 (SIX) HOURS AS NEEDED.   lisinopril (ZESTRIL) 40 MG tablet TAKE 1 TABLET (40 MG TOTAL) BY MOUTH DAILY. TO LOWER BLOOD PRESSURE   metFORMIN (GLUCOPHAGE) 500 MG tablet Take 1 tablet (500 mg total) by mouth 2 (two) times daily with a meal.   oxyCODONE-acetaminophen (PERCOCET) 5-325 MG tablet Take 1 tablet by mouth every 4 (four) hours as needed for moderate pain or severe pain.   OXYGEN Inhale 2 L into the lungs continuous.    spironolactone (ALDACTONE) 25 MG tablet TAKE 1 TABLET (25 MG TOTAL) BY MOUTH DAILY.   tiZANidine (ZANAFLEX) 4 MG tablet  Take 1 tablet (4 mg total) by mouth every 8 (eight) hours as needed for muscle spasms.   diazepam (VALIUM) 5 MG tablet Take one tablet by mouth with food one hour prior to procedure. May repeat 30 minutes prior if needed. (Patient not taking: Reported on 08/04/2021)   [DISCONTINUED] cetirizine (ZYRTEC) 10 MG tablet Take 1 tablet (10 mg total) by mouth daily as needed for allergies. (Patient not taking: Reported on 03/21/2019)   [DISCONTINUED] loratadine (ALLERGY RELIEF) 10 MG tablet Take 1 tablet (10 mg total) by mouth daily. For nasal  congestion/allergy symptoms (Patient not taking: Reported on 04/04/2019)   [DISCONTINUED] metoprolol tartrate (LOPRESSOR) 50 MG tablet Take 1 tablet (50 mg total) by mouth 2 (two) times daily. (Patient not taking: Reported on 05/24/2019)   [DISCONTINUED] tiotropium (SPIRIVA HANDIHALER) 18 MCG inhalation capsule Place 1 capsule (18 mcg total) into inhaler and inhale daily. (Patient not taking: Reported on 05/24/2019)   No facility-administered encounter medications on file as of 08/04/2021.    Allergies (verified) Periactin [cyproheptadine]   History: Past Medical History:  Diagnosis Date   Aortic atherosclerosis (HCC)    CHF (congestive heart failure) (HCC)    COPD (chronic obstructive pulmonary disease) ([place]) Dx 2013   HTN (hypertension) 08/30/2013   Hypertension    Lower leg edema 06/2016   Past Surgical History:  Procedure Laterality Date   ABDOMINAL HYSTERECTOMY     BACK SURGERY     Family History  Problem Relation Age of Onset   Hypertension Maternal Grandmother    Social History   Socioeconomic History   Marital status: Widowed    Spouse name: Not on file   Number of children: Not on file   Years of education: Not on file   Highest education level: Not on file  Occupational History   Not on file  Tobacco Use   Smoking status: Every Day    Packs/day: 0.50    Years: 38.00    Pack years: 19.00    Types: Cigarettes   Smokeless tobacco: Never  Vaping Use   Vaping Use: Never used  Substance and Sexual Activity   Alcohol use: No   Drug use: No   Sexual activity: Not on file  Other Topics Concern   Not on file  Social History Narrative   Lives alone in her apartment in [place]. Works as a Insurance account manager at [employer].   Social Determinants of Health   Financial Resource Strain: Not on file  Food Insecurity: Not on file  Transportation Needs: Not on file  Physical Activity: Not on file  Stress: Not on file  Social Connections: Not on file    Tobacco  Counseling Ready to quit: Not Answered Counseling given: Not Answered   Clinical Intake:  Pre-visit preparation completed: No  Pain : 0-10 Pain Score: 8  Pain Location: Back     Diabetes: Yes CBG done?: No     Diabetic?Yes  Interpreter Needed?: No      Activities of Daily Living In your present state of health, do you have any difficulty performing the following activities: 08/04/2021  Hearing? N  Vision? Y  Difficulty concentrating or making decisions? N  Walking or climbing stairs? Y  Dressing or bathing? N  Doing errands, shopping? N  Preparing Food and eating ? N  Using the Toilet? N  In the past six months, have you accidently leaked urine? N  Do you have problems with loss of bowel control? N  Managing your Medications? N  Managing your Finances? N  Housekeeping or managing your Housekeeping? N  Some recent data might be hidden    Patient Care Team: Charlott Rakes, MD as PCP - General (Family Medicine)  Indicate any recent Medical Services you may have received from other than [facility] providers in the past year (date may be approximate).     Assessment:   This is a routine wellness examination for Merikay.  Hearing/Vision screen Vision Screening   Right eye Left eye Both eyes  Without correction 20/40 20/30   With correction       Dietary issues and exercise activities discussed:     Goals Addressed   None    Depression Screen PHQ 2/9 Scores 08/04/2021 06/24/2021 05/14/2020 10/16/2019 07/16/2019 04/04/2019 03/21/2019  PHQ - 2 Score _0 0 _1 PHQ- 9 Score _2 - _3 Exception Documentation - - - - - Medical reason -    Fall Risk Fall Risk  08/04/2021 05/14/2020 10/16/2019 07/16/2019 05/29/2019  Falls in the past year? 0 0 0 0 0  Number falls in past yr: 0 - - - -  Injury with Fall? 0 - - - -  Risk for fall due to : - No Fall Risks - - -  Follow up - - Falls evaluation completed - -    FALL RISK PREVENTION PERTAINING TO THE  HOME:  Any stairs in or around the home? No  If so, are there any without handrails?  N/a Home free of loose throw rugs in walkways, pet beds, electrical cords, etc? Yes  Adequate lighting in your home to reduce risk of falls? Yes   ASSISTIVE DEVICES UTILIZED TO PREVENT FALLS:  Life alert? No  Use of a cane, walker or w/c? No  Grab bars in the bathroom? No  Shower chair or bench in shower? No  Elevated toilet seat or a handicapped toilet? No   TIMED UP AND GO:  Was the test performed? Yes .  Length of time to ambulate 10 feet: 10 sec.   Gait slow and steady without use of assistive device  Cognitive Function:        Immunizations Immunization History  Administered Date(s) Administered   Influenza,inj,Quad PF,6+ Mos 08/16/2017, 07/16/2019   Pneumococcal Polysaccharide-23 06/30/2016, 03/02/2019   Tdap 04/04/2019    TDAP status: Up to date  Flu Vaccine status: Up to date  Pneumococcal vaccine status: Up to date    Qualifies for Shingles Vaccine? Yes   Zostavax completed No   Shingrix Completed?: Yes  Screening Tests Health Maintenance  Topic Date Due   COVID-19 Vaccine (1) Never done   Zoster Vaccines- Shingrix (1 of 2) Never done   COLONOSCOPY (Pts 45-2yr Insurance coverage will need to be confirmed)  Never done   MAMMOGRAM  Never done   DEXA SCAN  Never done   PNA vac Low Risk Adult (1 of 2 - PCV13) 03/09/2020   INFLUENZA VACCINE  06/22/2021   TETANUS/TDAP  04/03/2029   Hepatitis C Screening  Completed   HPV VACCINES  Aged Out    Health Maintenance  Health Maintenance Due  Topic Date Due   COVID-19 Vaccine (1) Never done   Zoster Vaccines- Shingrix (1 of 2) Never done   COLONOSCOPY (Pts 45-447yrInsurance coverage will need to be confirmed)  Never done   MAMMOGRAM  Never done   DEXA SCAN  Never done   PNA vac Low Risk Adult (1 of 2 -  PCV13) 03/09/2020   INFLUENZA VACCINE  06/22/2021    Colorectal cancer screening: Referral to GI placed  today. Pt aware the office will call re: appt.  Mammogram status: Ordered today. Pt provided with contact info and advised to call to schedule appt.   Bone Density status: Ordered today. Pt provided with contact info and advised to call to schedule appt.   Additional Screening:  Hepatitis C Screening: does qualify; Completed 04/04/19   Dental Screening: Recommended annual dental exams for proper oral hygiene  Community Resource Referral / Chronic Care Management: CRR required this visit?  Yes  -provided resource for [facility] health transportation.  CCM required this visit?  No      Plan:   1. Encounter for Medicare annual wellness exam Counseled on 150 minutes of exercise per week, healthy eating (including decreased daily intake of saturated fats, cholesterol, added sugars, sodium),  routine healthcare maintenance.  - Flu Vaccine QUAD 62moIM (Fluarix, Fluzone & Alfiuria Quad PF)  2. Screening for colon cancer - Ambulatory referral to Gastroenterology  3. Estrogen deficiency - DG Bone Density; Future  4. Encounter for screening mammogram for malignant neoplasm of breast - MM 3D SCREEN BREAST BILATERAL; Future  5. Need for pneumococcal vaccine - Pneumococcal conjugate vaccine 20-valent   I have personally reviewed and noted the following in the patient's chart:   Medical and social history Use of alcohol, tobacco or illicit drugs  Current medications and supplements including opioid prescriptions.  Functional ability and status Nutritional status Physical activity Advanced directives List of other physicians Hospitalizations, surgeries, and ER visits in previous 12 months Vitals Screenings to include cognitive, depression, and falls Referrals and appointments  In addition, I have reviewed and discussed with patient certain preventive protocols, quality metrics, and best practice recommendations. A written personalized care plan for preventive services as well as  general preventive health recommendations were provided to patient.     ECharlott Rakes MD   08/04/2021

## 2021-08-04 NOTE — Telephone Encounter (Signed)
Met with the patient when she was in the clinic today and provided her with phone numbers for Medicaid Transportation and [facility]. She has contacted [employer] for transportation to medical appointments but the service has not been reliable.  Explained to her that Medicaid requires a 2-3 day notice to schedule rides.  Also explained that [facility] can generally provide rides same day and they only transport to [facility] facilities.   This CM spoke to [facility] and registered her for future services.  

## 2021-08-04 NOTE — Patient Instructions (Addendum)
  [name] , Thank you for taking time to come for your Medicare Wellness Visit. I appreciate your ongoing commitment to your health goals. Please review the following plan we discussed and let me know if I can assist you in the future.   These are the goals we discussed:  Goals   None     This is a list of the screening recommended for you and due dates:  Health Maintenance  Topic Date Due   COVID-19 Vaccine (1) Never done   Zoster (Shingles) Vaccine (1 of 2) Never done   Colon Cancer Screening  Never done   Mammogram  Never done   DEXA scan (bone density measurement)  Never done   Pneumonia vaccines (1 of 2 - PCV13) 03/09/2020   Flu Shot  06/22/2021   Tetanus Vaccine  04/03/2029   Hepatitis C Screening: USPSTF Recommendation to screen - Ages 18-79 yo.  Completed   HPV Vaccine  Aged Out   MEDICAID TRANSPORTATION [phone]  [facility] [id number]

## 2021-08-06 ENCOUNTER — Other Ambulatory Visit: Payer: Self-pay

## 2021-08-06 ENCOUNTER — Ambulatory Visit: Payer: Self-pay

## 2021-08-06 ENCOUNTER — Ambulatory Visit (INDEPENDENT_AMBULATORY_CARE_PROVIDER_SITE_OTHER): Payer: Medicare HMO | Admitting: Physical Medicine and Rehabilitation

## 2021-08-06 ENCOUNTER — Encounter: Payer: Self-pay | Admitting: Physical Medicine and Rehabilitation

## 2021-08-06 VITALS — BP 145/86 | HR 88

## 2021-08-06 DIAGNOSIS — M5416 Radiculopathy, lumbar region: Secondary | ICD-10-CM

## 2021-08-06 DIAGNOSIS — R6889 Other general symptoms and signs: Secondary | ICD-10-CM | POA: Diagnosis not present

## 2021-08-06 MED ORDER — METHYLPREDNISOLONE ACETATE 80 MG/ML IJ SUSP
80.0000 mg | Freq: Once | INTRAMUSCULAR | Status: AC
  Administered 2021-08-06: 80 mg

## 2021-08-06 NOTE — Progress Notes (Signed)
Shari Obrien - 64 y.o. female MRN [id number]  Date of birth: 11-02-1956  Office Visit Note: Visit Date: 08/06/2021 PCP: Hoy Register, MD Referred by: Hoy Register, MD  Subjective: Chief Complaint  Patient presents with   Lower Back - Pain   Right Leg - Pain   Right Foot - Pain   HPI:  Shari Obrien is a 65 y.o. female who comes in today at the request of Ellin Goodie, FNP for planned Bilateral L3-4 Lumbar Transforaminal epidural steroid injection with fluoroscopic guidance.  The patient has failed conservative care including home exercise, medications, time and activity modification.  This injection will be diagnostic and hopefully therapeutic.  Please see requesting physician notes for further details and justification.   ROS Otherwise per HPI.  Assessment & Plan: Visit Diagnoses:    ICD-10-CM   1. Lumbar radiculopathy  M54.16 XR C-ARM NO REPORT    Epidural Steroid injection    methylPREDNISolone acetate (DEPO-MEDROL) injection 80 mg      Plan: No additional findings.   Meds & Orders:  Meds ordered this encounter  Medications   methylPREDNISolone acetate (DEPO-MEDROL) injection 80 mg    Orders Placed This Encounter  Procedures   XR C-ARM NO REPORT   Epidural Steroid injection    Follow-up: Return if symptoms worsen or fail to improve.   Procedures: No procedures performed  Lumbosacral Transforaminal Epidural Steroid Injection - Sub-Pedicular Approach with Fluoroscopic Guidance  Patient: Shari Obrien      Date of Birth: 12-03-1955 MRN: [id number] PCP: Hoy Register, MD      Visit Date: 08/06/2021   Universal Protocol:    Date/Time: 08/06/2021  Consent Given By: the patient  Position: PRONE  Additional Comments: Vital signs were monitored before and after the procedure. Patient was prepped and draped in the usual sterile fashion. The correct patient, procedure, and site was verified.   Injection Procedure Details:   Procedure  diagnoses: Lumbar radiculopathy [M54.16]    Meds Administered:  Meds ordered this encounter  Medications   methylPREDNISolone acetate (DEPO-MEDROL) injection 80 mg    Laterality: Bilateral  Location/Site: L3  Needle:5.0 in., 22 ga.  Short bevel or Quincke spinal needle  Needle Placement: Transforaminal  Findings:    -Comments: Excellent flow of contrast along the nerve, nerve root and into the epidural space.  Procedure Details: After squaring off the end-plates to get a true AP view, the C-arm was positioned so that an oblique view of the foramen as noted above was visualized. The target area is just inferior to the "nose of the scotty dog" or sub pedicular. The soft tissues overlying this structure were infiltrated with 2-3 ml. of 1% Lidocaine without Epinephrine.  The spinal needle was inserted toward the target using a "trajectory" view along the fluoroscope beam.  Under AP and lateral visualization, the needle was advanced so it did not puncture dura and was located close the 6 O'Clock position of the pedical in AP tracterory. Biplanar projections were used to confirm position. Aspiration was confirmed to be negative for CSF and/or blood. A 1-2 ml. volume of Isovue-250 was injected and flow of contrast was noted at each level. Radiographs were obtained for documentation purposes.   After attaining the desired flow of contrast documented above, a 0.5 to 1.0 ml test dose of 0.25% Marcaine was injected into each respective transforaminal space.  The patient was observed for 90 seconds post injection.  After no sensory deficits were reported, and normal lower extremity  motor function was noted,   the above injectate was administered so that equal amounts of the injectate were placed at each foramen (level) into the transforaminal epidural space.   Additional Comments:  The patient tolerated the procedure well Dressing: 2 x 2 sterile gauze and Band-Aid    Post-procedure  details: Patient was observed during the procedure. Post-procedure instructions were reviewed.  Patient left the clinic in stable condition.    Clinical History: MRI LUMBAR SPINE WITHOUT CONTRAST   TECHNIQUE: Multiplanar, multisequence MR imaging of the lumbar spine was performed. No intravenous contrast was administered.   COMPARISON:  CT lumbar spine 05/24/2019   FINDINGS: Segmentation:  Standard.   Alignment: Grade 1 anterolisthesis at L4-L5. trace retrolisthesis at L2-L3.   Vertebrae: No fracture, evidence of discitis, or aggressive bone lesion.   Conus medullaris and cauda equina: Conus extends to the L1-L2 level. Conus and cauda equina appear normal.   Paraspinal and other soft tissues: Negative   Disc levels:   T12-L1: No significant spinal canal or neural foraminal narrowing.   L1-L2: Moderate bilateral facet arthropathy and ligamentum flavum hypertrophy results in mild spinal canal stenosis. No significant neural foraminal narrowing.   L2-L3: Trace retrolisthesis. Mild broad-based disc bulging, ligamentum flavum hypertrophy, and bilateral facet arthropathy results in moderate spinal canal stenosis and mild bilateral neural foraminal narrowing.   L3-L4: Broad-based disc bulging, ligamentum flavum hypertrophy, and bilateral facet arthropathy results in moderate-severe spinal canal stenosis and mild left neural foraminal narrowing.   L4-L5: Grade 1 anterolisthesis with mild disc bulging, ligamentum flavum hypertrophy, and severe bilateral facet arthropathy results in moderate spinal canal stenosis, severe right and severe left neural foraminal narrowing.   L5-S1: Disc height loss with broad-based disc bulging, ligamentum flavum hypertrophy, and severe bilateral facet arthropathy results in severe right and mild left neural foraminal narrowing.   IMPRESSION: Multilevel degenerative changes of the lumbar spine with prominent bilateral facet arthropathy,  with varying degrees of spinal canal stenosis and neural foraminal narrowing as summarized below:   L1-L2: Mild spinal canal stenosis.   L2-L3: Moderate spinal canal stenosis and mild bilateral neural foraminal narrowing.   L3-L4: Moderate-severe spinal canal stenosis. Mild left neural foraminal narrowing.   L4-L5: Grade 1 anterolisthesis. Moderate spinal canal stenosis. Severe bilateral neural foraminal narrowing.   L5-S1: Severe right and mild left neural foraminal narrowing.     Electronically Signed   By: Caprice Renshaw M.D.   On: 07/11/2021 10:03     Objective:  VS:  HT:    WT:   BMI:     BP:(!) 145/86  HR:88bpm  TEMP: ( )  RESP:  Physical Exam Vitals and nursing note reviewed.  Constitutional:      General: She is not in acute distress.    Appearance: Normal appearance. She is not ill-appearing.  HENT:     Head: Normocephalic and atraumatic.     Right Ear: External ear normal.     Left Ear: External ear normal.  Eyes:     Extraocular Movements: Extraocular movements intact.  Cardiovascular:     Rate and Rhythm: Normal rate.     Pulses: Normal pulses.  Pulmonary:     Effort: Pulmonary effort is normal. No respiratory distress.  Abdominal:     General: There is no distension.     Palpations: Abdomen is soft.  Musculoskeletal:        General: Tenderness present.     Cervical back: Neck supple.     Right lower leg:  No edema.     Left lower leg: No edema.     Comments: Patient has good distal strength with no pain over the greater trochanters.  No clonus or focal weakness.  Skin:    Findings: No erythema, lesion or rash.  Neurological:     General: No focal deficit present.     Mental Status: She is alert and oriented to person, place, and time.     Sensory: No sensory deficit.     Motor: No weakness or abnormal muscle tone.     Coordination: Coordination normal.  Psychiatric:        Mood and Affect: Mood normal.        Behavior: Behavior normal.      Imaging: No results found.

## 2021-08-06 NOTE — Progress Notes (Signed)
Pt state lower back pain that travels down her right leg and foot. Pt state any movement can cause the pain to get worse. Pt state she take pain meds to help ease her pain.  Numeric Pain Rating Scale and Functional Assessment Average Pain 8   In the last MONTH (on 0-10 scale) has pain interfered with the following?  1. General activity like being  able to carry out your everyday physical activities such as walking, climbing stairs, carrying groceries, or moving a chair?  Rating(10)   +Driver, -BT, -Dye Allergies.

## 2021-08-06 NOTE — Patient Instructions (Signed)

## 2021-08-09 NOTE — Procedures (Signed)
Lumbosacral Transforaminal Epidural Steroid Injection - Sub-Pedicular Approach with Fluoroscopic Guidance  Patient: Shari Obrien      Date of Birth: [birth year] MRN: [id number] PCP: Hoy Register, MD      Visit Date: 08/06/2021   Universal Protocol:    Date/Time: 08/06/2021  Consent Given By: the patient  Position: PRONE  Additional Comments: Vital signs were monitored before and after the procedure. Patient was prepped and draped in the usual sterile fashion. The correct patient, procedure, and site was verified.   Injection Procedure Details:   Procedure diagnoses: Lumbar radiculopathy [M54.16]    Meds Administered:  Meds ordered this encounter  Medications   methylPREDNISolone acetate (DEPO-MEDROL) injection 80 mg    Laterality: Bilateral  Location/Site: L3  Needle:5.0 in., 22 ga.  Short bevel or Quincke spinal needle  Needle Placement: Transforaminal  Findings:    -Comments: Excellent flow of contrast along the nerve, nerve root and into the epidural space.  Procedure Details: After squaring off the end-plates to get a true AP view, the C-arm was positioned so that an oblique view of the foramen as noted above was visualized. The target area is just inferior to the "nose of the scotty dog" or sub pedicular. The soft tissues overlying this structure were infiltrated with 2-3 ml. of 1% Lidocaine without Epinephrine.  The spinal needle was inserted toward the target using a "trajectory" view along the fluoroscope beam.  Under AP and lateral visualization, the needle was advanced so it did not puncture dura and was located close the 6 O'Clock position of the pedical in AP tracterory. Biplanar projections were used to confirm position. Aspiration was confirmed to be negative for CSF and/or blood. A 1-2 ml. volume of Isovue-250 was injected and flow of contrast was noted at each level. Radiographs were obtained for documentation purposes.   After attaining the desired  flow of contrast documented above, a 0.5 to 1.0 ml test dose of 0.25% Marcaine was injected into each respective transforaminal space.  The patient was observed for 90 seconds post injection.  After no sensory deficits were reported, and normal lower extremity motor function was noted,   the above injectate was administered so that equal amounts of the injectate were placed at each foramen (level) into the transforaminal epidural space.   Additional Comments:  The patient tolerated the procedure well Dressing: 2 x 2 sterile gauze and Band-Aid    Post-procedure details: Patient was observed during the procedure. Post-procedure instructions were reviewed.  Patient left the clinic in stable condition.

## 2021-10-01 ENCOUNTER — Other Ambulatory Visit: Payer: Self-pay | Admitting: Pharmacist

## 2021-10-01 ENCOUNTER — Other Ambulatory Visit: Payer: Self-pay

## 2021-10-01 DIAGNOSIS — J439 Emphysema, unspecified: Secondary | ICD-10-CM

## 2021-10-01 MED ORDER — TRELEGY ELLIPTA 100-62.5-25 MCG/ACT IN AEPB
1.0000 | INHALATION_SPRAY | Freq: Every day | RESPIRATORY_TRACT | 3 refills | Status: DC
Start: 2021-10-01 — End: 2022-07-06
  Filled 2021-10-01: qty 60, 30d supply, fill #0
  Filled 2021-12-22: qty 60, 30d supply, fill #1
  Filled 2021-12-22: qty 60, 30d supply, fill #0
  Filled 2022-03-09: qty 60, 30d supply, fill #1
  Filled 2022-04-08: qty 60, 30d supply, fill #2

## 2021-11-11 ENCOUNTER — Other Ambulatory Visit: Payer: Self-pay | Admitting: Family Medicine

## 2021-11-11 ENCOUNTER — Other Ambulatory Visit: Payer: Self-pay

## 2021-11-11 DIAGNOSIS — I10 Essential (primary) hypertension: Secondary | ICD-10-CM

## 2021-11-11 DIAGNOSIS — I5032 Chronic diastolic (congestive) heart failure: Secondary | ICD-10-CM

## 2021-11-11 MED ORDER — FUROSEMIDE 40 MG PO TABS
40.0000 mg | ORAL_TABLET | Freq: Every day | ORAL | 2 refills | Status: DC
  Filled 2021-11-11 – 2021-12-22 (×2): qty 30, 30d supply, fill #0
  Filled 2021-12-22: qty 30, 30d supply, fill #1
  Filled 2022-01-18: qty 30, 30d supply, fill #0
  Filled 2022-01-18: qty 30, 30d supply, fill #1

## 2021-11-12 ENCOUNTER — Other Ambulatory Visit: Payer: Self-pay

## 2021-11-13 ENCOUNTER — Other Ambulatory Visit: Payer: Self-pay

## 2021-12-22 ENCOUNTER — Other Ambulatory Visit: Payer: Self-pay

## 2022-01-13 ENCOUNTER — Other Ambulatory Visit: Payer: Self-pay

## 2022-01-13 ENCOUNTER — Other Ambulatory Visit: Payer: Self-pay | Admitting: Family Medicine

## 2022-01-13 ENCOUNTER — Other Ambulatory Visit (HOSPITAL_COMMUNITY): Payer: Self-pay

## 2022-01-13 DIAGNOSIS — I10 Essential (primary) hypertension: Secondary | ICD-10-CM

## 2022-01-13 NOTE — Telephone Encounter (Signed)
Pt called to check status of refill request/ she stated she is out of medication and she has an appt for 3.14.23/ please advise

## 2022-01-14 ENCOUNTER — Other Ambulatory Visit: Payer: Self-pay

## 2022-01-18 ENCOUNTER — Other Ambulatory Visit: Payer: Self-pay | Admitting: Family Medicine

## 2022-01-18 ENCOUNTER — Other Ambulatory Visit (HOSPITAL_COMMUNITY): Payer: Self-pay

## 2022-01-18 ENCOUNTER — Other Ambulatory Visit: Payer: Self-pay

## 2022-01-18 DIAGNOSIS — I10 Essential (primary) hypertension: Secondary | ICD-10-CM

## 2022-01-18 MED ORDER — AMLODIPINE BESYLATE 10 MG PO TABS
10.0000 mg | ORAL_TABLET | Freq: Every day | ORAL | 0 refills | Status: DC
  Filled 2022-01-18: qty 30, 30d supply, fill #0

## 2022-01-18 MED ORDER — LISINOPRIL 40 MG PO TABS
ORAL_TABLET | ORAL | 0 refills | Status: DC
  Filled 2022-01-18: qty 30, 30d supply, fill #0

## 2022-01-18 NOTE — Telephone Encounter (Signed)
Pt asking for  temporay supply of amlodipine 10 mg and lisinopril, because she is all out. Francis Dowse also says she doenst have transportaiton to pickup. She was told mail delivery protocol was changed. Please call back for assistance.

## 2022-01-19 ENCOUNTER — Other Ambulatory Visit: Payer: Self-pay

## 2022-01-22 ENCOUNTER — Other Ambulatory Visit: Payer: Self-pay

## 2022-01-25 ENCOUNTER — Other Ambulatory Visit: Payer: Self-pay

## 2022-02-02 ENCOUNTER — Ambulatory Visit: Payer: Medicare HMO | Admitting: Family Medicine

## 2022-03-09 ENCOUNTER — Other Ambulatory Visit (HOSPITAL_COMMUNITY): Payer: Self-pay

## 2022-03-09 ENCOUNTER — Other Ambulatory Visit: Payer: Self-pay | Admitting: Family Medicine

## 2022-03-09 DIAGNOSIS — I5032 Chronic diastolic (congestive) heart failure: Secondary | ICD-10-CM

## 2022-03-09 DIAGNOSIS — I10 Essential (primary) hypertension: Secondary | ICD-10-CM

## 2022-03-09 MED ORDER — FUROSEMIDE 40 MG PO TABS
40.0000 mg | ORAL_TABLET | Freq: Every day | ORAL | 1 refills | Status: DC
  Filled 2022-03-09: qty 30, 30d supply, fill #0
  Filled 2022-04-08: qty 30, 30d supply, fill #1

## 2022-03-09 MED ORDER — AMLODIPINE BESYLATE 10 MG PO TABS
10.0000 mg | ORAL_TABLET | Freq: Every day | ORAL | 1 refills | Status: DC
  Filled 2022-03-09: qty 30, 30d supply, fill #0
  Filled 2022-04-08: qty 30, 30d supply, fill #1

## 2022-03-09 MED ORDER — LISINOPRIL 40 MG PO TABS
ORAL_TABLET | ORAL | 1 refills | Status: DC
  Filled 2022-03-09: qty 30, 30d supply, fill #0
  Filled 2022-04-08: qty 30, 30d supply, fill #1

## 2022-04-08 ENCOUNTER — Other Ambulatory Visit (HOSPITAL_COMMUNITY): Payer: Self-pay

## 2022-04-08 ENCOUNTER — Other Ambulatory Visit: Payer: Self-pay | Admitting: Family Medicine

## 2022-04-08 DIAGNOSIS — J438 Other emphysema: Secondary | ICD-10-CM

## 2022-04-08 MED ORDER — ALBUTEROL SULFATE HFA 108 (90 BASE) MCG/ACT IN AERS
2.0000 | INHALATION_SPRAY | RESPIRATORY_TRACT | 6 refills | Status: DC | PRN
  Filled 2022-04-08: qty 18, 16d supply, fill #0
  Filled 2022-05-20: qty 18, 16d supply, fill #1
  Filled 2022-07-06: qty 6.7, 16d supply, fill #2

## 2022-04-26 ENCOUNTER — Ambulatory Visit: Payer: Medicare HMO | Admitting: Family Medicine

## 2022-05-20 ENCOUNTER — Other Ambulatory Visit: Payer: Self-pay | Admitting: Family Medicine

## 2022-05-20 ENCOUNTER — Other Ambulatory Visit (HOSPITAL_COMMUNITY): Payer: Self-pay

## 2022-05-20 DIAGNOSIS — I5032 Chronic diastolic (congestive) heart failure: Secondary | ICD-10-CM

## 2022-05-20 DIAGNOSIS — I10 Essential (primary) hypertension: Secondary | ICD-10-CM

## 2022-05-20 MED ORDER — FUROSEMIDE 40 MG PO TABS
40.0000 mg | ORAL_TABLET | Freq: Every day | ORAL | 1 refills | Status: DC
  Filled 2022-05-20: qty 30, 30d supply, fill #0
  Filled 2022-07-06: qty 30, 30d supply, fill #1

## 2022-05-20 MED ORDER — AMLODIPINE BESYLATE 10 MG PO TABS
10.0000 mg | ORAL_TABLET | Freq: Every day | ORAL | 1 refills | Status: DC
  Filled 2022-05-20: qty 30, 30d supply, fill #0
  Filled 2022-07-06: qty 30, 30d supply, fill #1

## 2022-05-20 MED ORDER — LISINOPRIL 40 MG PO TABS
ORAL_TABLET | ORAL | 1 refills | Status: DC
  Filled 2022-05-20: qty 30, 30d supply, fill #0
  Filled 2022-07-06: qty 30, 30d supply, fill #1

## 2022-05-20 NOTE — Telephone Encounter (Signed)
Requested medication (s) are due for refill today:   Yes for all 3  Requested medication (s) are on the active medication list:   Yes for all 3  Future visit scheduled:   Yes 9/14   Last ordered: All 3 ordered 03/09/2022 #30, 1 refill  Returned because all labs are due and been over 6 months since seen.   Provider to review for refills prior to upcoming appt in Sept.   Requested Prescriptions  Pending Prescriptions Disp Refills   furosemide (LASIX) 40 MG tablet 30 tablet 1    Sig: Take 1 tablet (40 mg total) by mouth daily.     Cardiovascular:  Diuretics - Loop Failed - 05/20/2022  1:09 PM      Failed - K in normal range and within 180 days    Potassium  Date Value Ref Range Status  06/24/2021 4.2 3.5 - 5.2 mmol/L Final         Failed - Ca in normal range and within 180 days    Calcium  Date Value Ref Range Status  06/24/2021 9.5 8.7 - 10.3 mg/dL Final   Calcium, Ion  Date Value Ref Range Status  05/15/2008 0.97 (L)  Final         Failed - Na in normal range and within 180 days    Sodium  Date Value Ref Range Status  06/24/2021 142 134 - 144 mmol/L Final         Failed - Cr in normal range and within 180 days    Creat  Date Value Ref Range Status  01/19/2016 0.84 0.50 - 0.99 mg/dL Final   Creatinine, Ser  Date Value Ref Range Status  06/24/2021 1.06 (H) 0.57 - 1.00 mg/dL Final         Failed - Cl in normal range and within 180 days    Chloride  Date Value Ref Range Status  06/24/2021 100 96 - 106 mmol/L Final         Failed - Mg Level in normal range and within 180 days    No results found for: "MG"       Failed - Last BP in normal range    BP Readings from Last 1 Encounters:  08/06/21 (!) 145/86         Failed - Valid encounter within last 6 months    Recent Outpatient Visits           9 months ago Encounter for [employer] annual wellness exam   [facility] [place], [place], [place]   11 months ago Type 2 diabetes mellitus  without complication, without long-term current use of insulin (HCC)   [facility] Hoy Register, [place]   1 year ago Lumbar radiculopathy   [facility] [place], [place], [place]   1 year ago Erroneous encounter - disregard   [place] [facility] [place], Odette Horns, MD   2 years ago Colon cancer screening   [facility] [place], Odette Horns, [place]       Future Appointments             In 2 months Hoy Register, MD [facility]             amLODipine (NORVASC) 10 MG tablet 30 tablet 1    Sig: Take 1 tablet by mouth daily.     Cardiovascular: Calcium Channel Blockers 2 Failed -  05/20/2022  1:09 PM      Failed - Last BP in normal range    BP Readings from Last 1 Encounters:  08/06/21 (!) 145/86         Failed - Valid encounter within last 6 months    Recent Outpatient Visits           9 months ago Encounter for [employer] annual wellness exam   [facility] [place], [place], [place]   11 months ago Type 2 diabetes mellitus without complication, without long-term current use of insulin (HCC)   [facility] [facility] [place], [place], [place]   1 year ago Lumbar radiculopathy   [facility] [place], [place], [place]   1 year ago Erroneous encounter - disregard   [place] [facility] [place], Odette Horns, [place]   2 years ago Colon cancer screening   [facility] [place], Odette Horns, [place]       Future Appointments             In 2 months Hoy Register, MD [facility]            Passed - Last Heart Rate in normal range    Pulse Readings from Last 1 Encounters:  08/06/21 88          lisinopril (ZESTRIL) 40 MG tablet 30 tablet 1    Sig: TAKE 1 TABLET BY MOUTH DAILY TO LOWER BLOOD PRESSURE     Cardiovascular:   ACE Inhibitors Failed - 05/20/2022  1:09 PM      Failed - Cr in normal range and within 180 days    Creat  Date Value Ref Range Status  01/19/2016 0.84 0.50 - 0.99 mg/dL Final   Creatinine, Ser  Date Value Ref Range Status  06/24/2021 1.06 (H) 0.57 - 1.00 mg/dL Final         Failed - K in normal range and within 180 days    Potassium  Date Value Ref Range Status  06/24/2021 4.2 3.5 - 5.2 mmol/L Final         Failed - Last BP in normal range    BP Readings from Last 1 Encounters:  08/06/21 (!) 145/86         Failed - Valid encounter within last 6 months    Recent Outpatient Visits           9 months ago Encounter for [employer] annual wellness exam   [place] [place] [place], [place], [place]   11 months ago Type 2 diabetes mellitus without complication, without long-term current use of insulin (HCC)   [place] [facility] Hoy Register, [place]   1 year ago Lumbar radiculopathy   [facility] [place], [place], [place]   1 year ago Erroneous encounter - disregard   [place] [facility] Hoy Register, MD   2 years ago Colon cancer screening    [facility] Hoy Register, [place]       Future Appointments             In 2 months Hoy Register, MD [facility]            Passed - Patient is not pregnant

## 2022-07-06 ENCOUNTER — Other Ambulatory Visit (HOSPITAL_COMMUNITY): Payer: Self-pay

## 2022-07-06 ENCOUNTER — Other Ambulatory Visit: Payer: Self-pay | Admitting: Family Medicine

## 2022-07-06 DIAGNOSIS — J439 Emphysema, unspecified: Secondary | ICD-10-CM

## 2022-07-06 NOTE — Telephone Encounter (Signed)
Requested medication (s) are due for refill today: yes  Requested medication (s) are on the active medication list: yes  Last refill:  10/01/21 60 each 3 RF  Future visit scheduled: yes  Notes to clinic:  med not assigned a protocol   Requested Prescriptions  Pending Prescriptions Disp Refills   Fluticasone-Umeclidin-Vilant (TRELEGY ELLIPTA) 100-62.5-25 MCG/ACT AEPB 60 each 3    Sig: Inhale 1 puff into the lungs daily.     Off-Protocol Failed - 07/06/2022  1:15 PM      Failed - Medication not assigned to a protocol, review manually.      Failed - Valid encounter within last 12 months    Recent Outpatient Visits           11 months ago Encounter for [employer] annual wellness exam   [place] [facility] [place], [place], [place]   1 year ago Type 2 diabetes mellitus without complication, without long-term current use of insulin (HCC)   [place] [facility] Hoy Register, [place]   1 year ago Lumbar radiculopathy   [facility] [place], [place], [place]   1 year ago Erroneous encounter - disregard   [place] [facility] Hoy Register, MD   2 years ago Colon cancer screening   [place] [facility] Hoy Register, MD       Future Appointments             In 1 month Hoy Register, MD [facility]

## 2022-07-07 ENCOUNTER — Other Ambulatory Visit (HOSPITAL_COMMUNITY): Payer: Self-pay

## 2022-07-07 MED ORDER — TRELEGY ELLIPTA 100-62.5-25 MCG/ACT IN AEPB
1.0000 | INHALATION_SPRAY | Freq: Every day | RESPIRATORY_TRACT | 0 refills | Status: DC
  Filled 2022-07-07: qty 60, 30d supply, fill #0

## 2022-07-10 ENCOUNTER — Other Ambulatory Visit (HOSPITAL_COMMUNITY): Payer: Self-pay

## 2022-07-12 ENCOUNTER — Ambulatory Visit: Payer: Self-pay

## 2022-07-12 NOTE — Telephone Encounter (Signed)
Pt. Wants to talk with Dr. Baxter Flattery nurse. Declines to tell triage nurse what it is in regard to.

## 2022-07-12 NOTE — Telephone Encounter (Signed)
Pt was called and VM was left informing patient to return call. 

## 2022-08-02 ENCOUNTER — Other Ambulatory Visit: Payer: Self-pay | Admitting: Family Medicine

## 2022-08-02 DIAGNOSIS — J438 Other emphysema: Secondary | ICD-10-CM

## 2022-08-02 NOTE — Telephone Encounter (Signed)
Medication Refill - Medication: albuterol (VENTOLIN HFA) 108 (90 Base) MCG/ACT inhaler  Has the patient contacted their pharmacy? Yes.    (Agent: If yes, when and what did the pharmacy advise?) Pharmacy requesting refill  Preferred Pharmacy (with phone number or street name):  [facility] - [place], [place] - [address] Phone:  [phone]  Fax:  [phone]     Has the patient been seen for an appointment in the last year OR does the patient have an upcoming appointment? Yes.    Pharmacy is not listed on pts preferred pharmacies

## 2022-08-04 NOTE — Telephone Encounter (Signed)
Requested medication (s) are due for refill today: Yes  Requested medication (s) are on the active medication list: Yes  Last refill:  04/08/22  Future visit scheduled: Yes  Notes to clinic:  See request.    Requested Prescriptions  Pending Prescriptions Disp Refills   albuterol (VENTOLIN HFA) 108 (90 Base) MCG/ACT inhaler 18 g 0    Sig: Inhale 2 puffs into the lungs every 4 hours as needed for wheezing or shortness of breath.     Pulmonology:  Beta Agonists 2 Failed - 08/02/2022  3:46 PM      Failed - Last BP in normal range    BP Readings from Last 1 Encounters:  08/06/21 (!) 145/86         Failed - Valid encounter within last 12 months    Recent Outpatient Visits           1 year ago Encounter for [employer] annual wellness exam   [place] [place] [facility] [place], [place], [place]   1 year ago Type 2 diabetes mellitus without complication, without long-term current use of insulin (HCC)   [place] [facility] Hoy Register, [place]   1 year ago Lumbar radiculopathy   [facility] [place], [place], [place]   2 years ago Erroneous encounter - disregard   [place] [facility] [place], Odette Horns, [place]   2 years ago Colon cancer screening   [place] [facility] [place], Odette Horns, [place]       Future Appointments             Tomorrow Hoy Register, MD [facility]            Passed - Last Heart Rate in normal range    Pulse Readings from Last 1 Encounters:  08/06/21 88

## 2022-08-05 ENCOUNTER — Other Ambulatory Visit (HOSPITAL_COMMUNITY): Payer: Self-pay

## 2022-08-05 ENCOUNTER — Other Ambulatory Visit: Payer: Self-pay | Admitting: Pharmacist

## 2022-08-05 ENCOUNTER — Ambulatory Visit: Payer: Medicare HMO | Attending: Family Medicine | Admitting: Family Medicine

## 2022-08-05 ENCOUNTER — Encounter: Payer: Self-pay | Admitting: Family Medicine

## 2022-08-05 VITALS — BP 112/72 | HR 88 | Ht 64.0 in | Wt 252.8 lb

## 2022-08-05 DIAGNOSIS — I10 Essential (primary) hypertension: Secondary | ICD-10-CM

## 2022-08-05 DIAGNOSIS — M48061 Spinal stenosis, lumbar region without neurogenic claudication: Secondary | ICD-10-CM | POA: Diagnosis not present

## 2022-08-05 DIAGNOSIS — Z23 Encounter for immunization: Secondary | ICD-10-CM | POA: Diagnosis not present

## 2022-08-05 DIAGNOSIS — E119 Type 2 diabetes mellitus without complications: Secondary | ICD-10-CM | POA: Diagnosis not present

## 2022-08-05 DIAGNOSIS — Z6841 Body Mass Index (BMI) 40.0 and over, adult: Secondary | ICD-10-CM | POA: Diagnosis not present

## 2022-08-05 DIAGNOSIS — I5032 Chronic diastolic (congestive) heart failure: Secondary | ICD-10-CM

## 2022-08-05 DIAGNOSIS — J438 Other emphysema: Secondary | ICD-10-CM | POA: Diagnosis not present

## 2022-08-05 DIAGNOSIS — Z1231 Encounter for screening mammogram for malignant neoplasm of breast: Secondary | ICD-10-CM

## 2022-08-05 DIAGNOSIS — F17210 Nicotine dependence, cigarettes, uncomplicated: Secondary | ICD-10-CM | POA: Diagnosis not present

## 2022-08-05 DIAGNOSIS — R6889 Other general symptoms and signs: Secondary | ICD-10-CM | POA: Diagnosis not present

## 2022-08-05 DIAGNOSIS — R29818 Other symptoms and signs involving the nervous system: Secondary | ICD-10-CM | POA: Diagnosis not present

## 2022-08-05 DIAGNOSIS — E2839 Other primary ovarian failure: Secondary | ICD-10-CM | POA: Diagnosis not present

## 2022-08-05 LAB — POCT GLYCOSYLATED HEMOGLOBIN (HGB A1C): HbA1c, POC (controlled diabetic range): 7 % (ref 0.0–7.0)

## 2022-08-05 MED ORDER — SEMAGLUTIDE(0.25 OR 0.5MG/DOS) 2 MG/3ML ~~LOC~~ SOPN
0.2500 mg | PEN_INJECTOR | SUBCUTANEOUS | 3 refills | Status: DC
  Filled 2022-08-05: qty 3, 56d supply, fill #0

## 2022-08-05 MED ORDER — SPIRONOLACTONE 25 MG PO TABS
25.0000 mg | ORAL_TABLET | Freq: Every day | ORAL | 1 refills | Status: DC
  Filled 2022-08-05: qty 90, 90d supply, fill #0

## 2022-08-05 MED ORDER — IPRATROPIUM-ALBUTEROL 0.5-2.5 (3) MG/3ML IN SOLN
3.0000 mL | Freq: Four times a day (QID) | RESPIRATORY_TRACT | 11 refills | Status: DC | PRN
  Filled 2022-08-05: qty 360, 30d supply, fill #0

## 2022-08-05 MED ORDER — TIZANIDINE HCL 4 MG PO TABS
4.0000 mg | ORAL_TABLET | Freq: Three times a day (TID) | ORAL | 6 refills | Status: DC | PRN
  Filled 2022-08-05: qty 90, 30d supply, fill #0

## 2022-08-05 MED ORDER — AMLODIPINE BESYLATE 10 MG PO TABS
10.0000 mg | ORAL_TABLET | Freq: Every day | ORAL | 1 refills | Status: AC
  Filled 2022-08-05: qty 90, 90d supply, fill #0

## 2022-08-05 MED ORDER — FUROSEMIDE 40 MG PO TABS
40.0000 mg | ORAL_TABLET | Freq: Every day | ORAL | 1 refills | Status: DC
  Filled 2022-08-05: qty 90, 90d supply, fill #0

## 2022-08-05 MED ORDER — DULOXETINE HCL 60 MG PO CPEP
60.0000 mg | ORAL_CAPSULE | Freq: Every day | ORAL | 1 refills | Status: AC
  Filled 2022-08-05: qty 90, 90d supply, fill #0

## 2022-08-05 MED ORDER — TRELEGY ELLIPTA 100-62.5-25 MCG/ACT IN AEPB
1.0000 | INHALATION_SPRAY | Freq: Every day | RESPIRATORY_TRACT | 6 refills | Status: DC
  Filled 2022-08-05: qty 60, 30d supply, fill #0

## 2022-08-05 MED ORDER — LISINOPRIL 40 MG PO TABS
40.0000 mg | ORAL_TABLET | Freq: Every day | ORAL | 1 refills | Status: DC
  Filled 2022-08-05: qty 90, 90d supply, fill #0

## 2022-08-05 MED ORDER — ATORVASTATIN CALCIUM 20 MG PO TABS
20.0000 mg | ORAL_TABLET | Freq: Every day | ORAL | 1 refills | Status: DC
  Filled 2022-08-05: qty 90, 90d supply, fill #0

## 2022-08-05 MED ORDER — ALBUTEROL SULFATE HFA 108 (90 BASE) MCG/ACT IN AERS
2.0000 | INHALATION_SPRAY | RESPIRATORY_TRACT | 3 refills | Status: DC | PRN
  Filled 2022-08-05: qty 6.7, 16d supply, fill #0

## 2022-08-05 NOTE — Progress Notes (Signed)
Discuss inhaler. Back pain

## 2022-08-05 NOTE — Patient Instructions (Signed)

## 2022-08-05 NOTE — Progress Notes (Signed)
Subjective:  Patient ID: Shari Obrien, female    DOB: [birth year]  Age: 66 y.o. MRN: [id number]  CC: Diabetes   HPI Shari Obrien is a 66 y.o. year old female with a history of hypertension, COPD, HFpEF(EF 65-70% from 2-D echo of 06/2016), polycythemia, lumbar radiculopathy due to spinal stenosis, type 2 diabetes mellitus (A1c 7.0) who presents today for a follow-up visit.  Interval History:  She complains she received the wrong albuterol inhaler from the pharmacy.  States she does better with the below albuterol inhaler which is the [facility] Xxx but received another one "which has menthol in it" makes it difficult for her to breathe.  She is unsure if she snores. She is also on Trelegy but states she has to use her albuterol MDI. During sleep she has difficulty breathing.  Denies presence of pedal edema but has gained some weight. She continues to smoke half a pack of cigarettes per day and has been smoking since her teenage years.  Her back pain is getting worse.  At her last visit she had informed me after I referred her to pain management that she did not like the oxycodone she was prescribed.  She is currently on Cymbalta and a muscle relaxant for her pain.  Pain prevents her from standing up straight and is worse when she walks rated as moderate to severe.  For her diabetes she is on metformin and is tolerating it well.  Endorses nonadherence with a diabetic diet as she continues to eat high again denies ice cream.  She has no neuropathy or visual concerns.  Past Medical History:  Diagnosis Date   Aortic atherosclerosis (HCC)    CHF (congestive heart failure) ([place])    COPD (chronic obstructive pulmonary disease) ([place]) Dx 2013   HTN (hypertension) 08/30/2013   Hypertension    Lower leg edema 06/2016    Past Surgical History:  Procedure Laterality Date   ABDOMINAL HYSTERECTOMY     BACK SURGERY      Family History  Problem Relation Age of Onset   Hypertension Maternal Grandmother      Social History   Socioeconomic History   Marital status: Widowed    Spouse name: Not on file   Number of children: Not on file   Years of education: Not on file   Highest education level: Not on file  Occupational History   Not on file  Tobacco Use   Smoking status: Every Day    Packs/day: 0.50    Years: 38.00    Total pack years: 19.00    Types: Cigarettes   Smokeless tobacco: Never  Vaping Use   Vaping Use: Never used  Substance and Sexual Activity   Alcohol use: No   Drug use: No   Sexual activity: Not on file  Other Topics Concern   Not on file  Social History Narrative   Lives alone in her apartment in [place]. Works as a Insurance account manager at [employer].   Social Determinants of Health   Financial Resource Strain: Not on file  Food Insecurity: Not on file  Transportation Needs: Not on file  Physical Activity: Not on file  Stress: Not on file  Social Connections: Not on file    Allergies  Allergen Reactions   Periactin [Cyproheptadine] Swelling    Outpatient Medications Prior to Visit  Medication Sig Dispense Refill   Aspirin-Caffeine (BAYER BACK & BODY PAIN EX ST PO) Take 2 tablets by mouth 2 (two) times a day.  Blood Glucose Monitoring Suppl (ACCU-CHEK GUIDE ME) w/Device KIT Use as directed daily before breakfast. 1 kit 0   Colchicine 0.6 MG CAPS Take 2 capsules by mouth on day 1, then 1 capsule daily for the next 5 days. 30 capsule 0   diazepam (VALIUM) 5 MG tablet Take one tablet by mouth with food one hour prior to procedure. May repeat 30 minutes prior if needed. 2 tablet 0   glucose blood (ACCU-CHEK GUIDE) test strip Use as instructed daily 100 each 12   hydrocortisone 2.5 % cream Apply topically 2 (two) times daily. 30 g 1   oxyCODONE-acetaminophen (PERCOCET) 5-325 MG tablet Take 1 tablet by mouth every 4 (four) hours as needed for moderate pain or severe pain. 20 tablet 0   OXYGEN Inhale 2 L into the lungs continuous.      albuterol (VENTOLIN HFA)  108 (90 Base) MCG/ACT inhaler Inhale 2 puffs into the lungs every 4 hours as needed for wheezing or shortness of breath. 18 g 6   amLODipine (NORVASC) 10 MG tablet Take 1 tablet by mouth daily. 30 tablet 1   atorvastatin (LIPITOR) 20 MG tablet Take 1 tablet (20 mg total) by mouth daily. 30 tablet 3   DULoxetine (CYMBALTA) 60 MG capsule Take 1 capsule (60 mg total) by mouth daily. For back pain 90 capsule 1   Fluticasone-Umeclidin-Vilant (TRELEGY ELLIPTA) 100-62.5-25 MCG/ACT AEPB Inhale 1 puff into the lungs daily. 60 each 0   furosemide (LASIX) 40 MG tablet Take 1 tablet by mouth daily. 30 tablet 1   lisinopril (ZESTRIL) 40 MG tablet TAKE 1 TABLET BY MOUTH DAILY TO LOWER BLOOD PRESSURE 30 tablet 1   metFORMIN (GLUCOPHAGE) 500 MG tablet Take 1 tablet (500 mg total) by mouth 2 (two) times daily with a meal. 180 tablet 1   tiZANidine (ZANAFLEX) 4 MG tablet Take 1 tablet (4 mg total) by mouth every 8 (eight) hours as needed for muscle spasms. 90 tablet 6   ipratropium-albuterol (DUONEB) 0.5-2.5 (3) MG/3ML SOLN TAKE 3 MLS BY NEBULIZATION EVERY 6 (SIX) HOURS AS NEEDED. 360 mL 11   spironolactone (ALDACTONE) 25 MG tablet TAKE 1 TABLET (25 MG TOTAL) BY MOUTH DAILY. 90 tablet 1   No facility-administered medications prior to visit.     ROS Review of Systems  Constitutional:  Negative for activity change and appetite change.  HENT:  Negative for sinus pressure and sore throat.   Respiratory:  Negative for chest tightness, shortness of breath and wheezing.   Cardiovascular:  Negative for chest pain and palpitations.  Gastrointestinal:  Negative for abdominal distention, abdominal pain and constipation.  Genitourinary: Negative.   Musculoskeletal:  Positive for back pain.  Psychiatric/Behavioral:  Negative for behavioral problems and dysphoric mood.     Objective:  BP 112/72   Pulse 88   Ht $R'5\' 4"'Kc$  (1.626 m)   Wt 252 lb 12.8 oz (114.7 kg)   SpO2 93%   BMI 43.39 kg/m      08/05/2022    8:43 AM  08/06/2021    1:13 PM 08/04/2021   10:14 AM  BP/Weight  Systolic BP 035 597 416  Diastolic BP 72 86 81  Wt. (Lbs) 252.8  237  BMI 43.39 kg/m2  40.68 kg/m2      Physical Exam Constitutional:      Appearance: She is well-developed.  Cardiovascular:     Rate and Rhythm: Normal rate.     Heart sounds: Normal heart sounds. No murmur heard. Pulmonary:  Effort: Pulmonary effort is normal.     Breath sounds: Normal breath sounds. No wheezing or rales.  Chest:     Chest wall: No tenderness.  Abdominal:     General: Bowel sounds are normal. There is no distension.     Palpations: Abdomen is soft. There is no mass.     Tenderness: There is no abdominal tenderness.  Musculoskeletal:     Right lower leg: No edema.     Left lower leg: No edema.     Comments: Tenderness on deep palpation of lumbar spine  Neurological:     Mental Status: She is alert and oriented to person, place, and time.  Psychiatric:        Mood and Affect: Mood normal.        Latest Ref Rng & Units 06/24/2021    9:42 AM 05/14/2020    2:38 PM 05/24/2019   10:12 AM  CMP  Glucose 65 - 99 mg/dL 123  97  115   BUN 8 - 27 mg/dL $Remove'30  28  17   'FOvQXen$ Creatinine 0.57 - 1.00 mg/dL 1.06  1.06  0.93   Sodium 134 - 144 mmol/L 142  146  141   Potassium 3.5 - 5.2 mmol/L 4.2  4.5  4.1   Chloride 96 - 106 mmol/L 100  102  105   CO2 20 - 29 mmol/L $RemoveB'25  22  25   'YLLXCVgH$ Calcium 8.7 - 10.3 mg/dL 9.5  9.2  8.7   Total Protein 6.0 - 8.5 g/dL 7.1  6.9    Total Bilirubin 0.0 - 1.2 mg/dL 0.3  0.8    Alkaline Phos 44 - 121 IU/L 69  71    AST 0 - 40 IU/L 18  20    ALT 0 - 32 IU/L 16  14      Lipid Panel     Component Value Date/Time   CHOL 181 06/24/2021 0942   TRIG 89 06/24/2021 0942   HDL 45 06/24/2021 0942   CHOLHDL 4.0 06/24/2021 0942   CHOLHDL 4.0 03/14/2015 1047   VLDL 15 03/14/2015 1047   LDLCALC 120 (H) 06/24/2021 0942    CBC    Component Value Date/Time   WBC 6.6 05/24/2019 1012   RBC 6.48 (H) 05/24/2019 1012   HGB 18.3 (H)  05/24/2019 1012   HGB 19.2 (H) 03/21/2019 1224   HCT 57.1 (H) 05/24/2019 1012   HCT 60.4 (H) 03/21/2019 1224   PLT 189 05/24/2019 1012   PLT 197 03/21/2019 1224   MCV 88.1 05/24/2019 1012   MCV 89 03/21/2019 1224   MCH 28.2 05/24/2019 1012   MCHC 32.0 05/24/2019 1012   RDW 17.8 (H) 05/24/2019 1012   RDW 16.4 (H) 03/21/2019 1224   LYMPHSABS 1.7 05/24/2019 1012   LYMPHSABS 1.6 03/21/2019 1224   MONOABS 0.9 05/24/2019 1012   EOSABS 0.2 05/24/2019 1012   EOSABS 0.4 03/21/2019 1224   BASOSABS 0.1 05/24/2019 1012   BASOSABS 0.1 03/21/2019 1224    Lab Results  Component Value Date   HGBA1C 7.0 08/05/2022    Assessment & Plan:  1. Type 2 diabetes mellitus without complication, without long-term current use of insulin (HCC) Controlled with A1c of 7.0 Discontinue metformin and initiated Ozempic after shared decision making due to weight loss and cardiovascular benefit and she is agreeable Pharmacy resident called in to provide education on administration of Ozempic Counseled on Diabetic diet, my plate method, 563 minutes of moderate intensity exercise/week Blood sugar logs  with fasting goals of 80-120 mg/dl, random of less than 180 and in the event of sugars less than 60 mg/dl or greater than 400 mg/dl encouraged to notify the clinic. Advised on the need for annual eye exams, annual foot exams, Pneumonia vaccine. - POCT glycosylated hemoglobin (Hb A1C) - Microalbumin / creatinine urine ratio - LP+Non-HDL Cholesterol - CMP14+EGFR - Semaglutide,0.25 or 0.5MG /DOS, 2 MG/3ML SOPN; Inject 0.25 mg into the skin once a week.  Dispense: 3 mL; Refill: 3 - atorvastatin (LIPITOR) 20 MG tablet; Take 1 tablet (20 mg total) by mouth daily.  Dispense: 90 tablet; Refill: 1  2. Smoking greater than 20 pack years Spent 3 minutes counseling on cessation but she is not ready to quit - CT CHEST LUNG CANCER SCREENING LOW DOSE WO CONTRAST; Future  3. Encounter for screening mammogram for malignant  neoplasm of breast - MM DIGITAL SCREENING BILATERAL; Future  4. Estrogen deficiency - DG Bone Density; Future  5. Spinal stenosis of lumbar region, unspecified whether neurogenic claudication present Uncontrolled Weight loss will be beneficial Advised to apply heat or ice whichever is tolerated to painful areas. Counseled on evidence of improvement in pain control with regards to yoga, water aerobics, massage, home physical therapy, exercise as tolerated. - Ambulatory referral to Pain Clinic - DULoxetine (CYMBALTA) 60 MG capsule; Take 1 capsule (60 mg total) by mouth daily. For back pain  Dispense: 90 capsule; Refill: 1 - tiZANidine (ZANAFLEX) 4 MG tablet; Take 1 tablet (4 mg total) by mouth every 8 (eight) hours as needed for muscle spasms.  Dispense: 90 tablet; Refill: 6  6. Suspected sleep apnea Given dyspnea we will need to work-up for possible sleep apnea - Split night study; Future  7. Essential hypertension Controlled Continue current regimen Counseled on blood pressure goal of less than 130/80, low-sodium, DASH diet, medication compliance, 150 minutes of moderate intensity exercise per week. Discussed medication compliance, adverse effects. - amLODipine (NORVASC) 10 MG tablet; Take 1 tablet (10 mg total) by mouth daily.  Dispense: 90 tablet; Refill: 1 - furosemide (LASIX) 40 MG tablet; Take 1 tablet (40 mg total) by mouth daily.  Dispense: 90 tablet; Refill: 1 - lisinopril (ZESTRIL) 40 MG tablet; Take 1 tablet (40 mg total) by mouth daily to lower blood pressure.  Dispense: 90 tablet; Refill: 1 - spironolactone (ALDACTONE) 25 MG tablet; Take 1 tablet (25 mg total) by mouth daily.  Dispense: 90 tablet; Refill: 1  8. Chronic diastolic heart failure (HCC) EF of 65 to 70% We will send a BNP Consider repeat echo at next visit - furosemide (LASIX) 40 MG tablet; Take 1 tablet (40 mg total) by mouth daily.  Dispense: 90 tablet; Refill: 1 - Brain natriuretic peptide  9. Other  emphysema (HCC) Uncontrolled, oxygen saturation is 93% Smoking cessation will be beneficial Pharmacy resident called in to speak with her about her inhalers with the goal of trying to get the pharmacy to dispense what she would like Advised that this all depends on her insurance company Low-dose chest CT also ordered and we will review this for additional pathology that could be causing worsening dyspnea - ipratropium-albuterol (DUONEB) 0.5-2.5 (3) MG/3ML SOLN; Take 3 mLs by nebulization every 6 (six) hours as needed.  Dispense: 360 mL; Refill: 11 - Fluticasone-Umeclidin-Vilant (TRELEGY ELLIPTA) 100-62.5-25 MCG/ACT AEPB; Inhale 1 puff into the lungs daily.  Dispense: 60 each; Refill: 6  10. Need for immunization against influenza - Flu Vaccine QUAD 31mo+IM (Fluarix, Fluzone & Alfiuria Quad PF)  Meds ordered this encounter  Medications   Semaglutide,0.25 or 0.5MG /DOS, 2 MG/3ML SOPN    Sig: Inject 0.25 mg into the skin once a week.    Dispense:  3 mL    Refill:  3    Discontinue metformin   amLODipine (NORVASC) 10 MG tablet    Sig: Take 1 tablet (10 mg total) by mouth daily.    Dispense:  90 tablet    Refill:  1   atorvastatin (LIPITOR) 20 MG tablet    Sig: Take 1 tablet (20 mg total) by mouth daily.    Dispense:  90 tablet    Refill:  1   DULoxetine (CYMBALTA) 60 MG capsule    Sig: Take 1 capsule (60 mg total) by mouth daily. For back pain    Dispense:  90 capsule    Refill:  1   furosemide (LASIX) 40 MG tablet    Sig: Take 1 tablet (40 mg total) by mouth daily.    Dispense:  90 tablet    Refill:  1   ipratropium-albuterol (DUONEB) 0.5-2.5 (3) MG/3ML SOLN    Sig: Take 3 mLs by nebulization every 6 (six) hours as needed.    Dispense:  360 mL    Refill:  11   lisinopril (ZESTRIL) 40 MG tablet    Sig: Take 1 tablet (40 mg total) by mouth daily to lower blood pressure.    Dispense:  90 tablet    Refill:  1   spironolactone (ALDACTONE) 25 MG tablet    Sig: Take 1 tablet (25  mg total) by mouth daily.    Dispense:  90 tablet    Refill:  1   Fluticasone-Umeclidin-Vilant (TRELEGY ELLIPTA) 100-62.5-25 MCG/ACT AEPB    Sig: Inhale 1 puff into the lungs daily.    Dispense:  60 each    Refill:  6   tiZANidine (ZANAFLEX) 4 MG tablet    Sig: Take 1 tablet (4 mg total) by mouth every 8 (eight) hours as needed for muscle spasms.    Dispense:  90 tablet    Refill:  6    Follow-up: Return in about 3 months (around 11/04/2022) for Chronic medical conditions.    Visit required 47 minutes of patient care including median intraservice time, reviewing previous notes and test results, coordination of care, counseling the patient in addition to management of chronic medical conditions.Time also spent ordering medications, investigations and documenting in the chart.  All questions were answered to the patient's satisfaction    Charlott Rakes, MD, FAAFP. [facility], [place]   08/05/2022, 3:01 PM

## 2022-08-06 ENCOUNTER — Other Ambulatory Visit (HOSPITAL_COMMUNITY): Payer: Self-pay

## 2022-08-06 LAB — CMP14+EGFR
ALT: 10 IU/L (ref 0–32)
AST: 20 IU/L (ref 0–40)
Albumin/Globulin Ratio: 1.6 (ref 1.2–2.2)
Albumin: 4.3 g/dL (ref 3.9–4.9)
Alkaline Phosphatase: 70 IU/L (ref 44–121)
BUN/Creatinine Ratio: 22 (ref 12–28)
BUN: 28 mg/dL — ABNORMAL HIGH (ref 8–27)
Bilirubin Total: 0.3 mg/dL (ref 0.0–1.2)
CO2: 20 mmol/L (ref 20–29)
Calcium: 9.1 mg/dL (ref 8.7–10.3)
Chloride: 101 mmol/L (ref 96–106)
Creatinine, Ser: 1.27 mg/dL — ABNORMAL HIGH (ref 0.57–1.00)
Globulin, Total: 2.7 g/dL (ref 1.5–4.5)
Glucose: 124 mg/dL — ABNORMAL HIGH (ref 70–99)
Potassium: 4.5 mmol/L (ref 3.5–5.2)
Sodium: 143 mmol/L (ref 134–144)
Total Protein: 7 g/dL (ref 6.0–8.5)
eGFR: 46 mL/min/{1.73_m2} — ABNORMAL LOW (ref >59)

## 2022-08-06 LAB — BRAIN NATRIURETIC PEPTIDE: BNP: 23.1 pg/mL (ref 0.0–100.0)

## 2022-08-06 LAB — LP+NON-HDL CHOLESTEROL
Cholesterol, Total: 175 mg/dL (ref 100–199)
HDL: 38 mg/dL — ABNORMAL LOW (ref >39)
LDL Chol Calc (NIH): 119 mg/dL — ABNORMAL HIGH (ref 0–99)
Total Non-HDL-Chol (LDL+VLDL): 137 mg/dL — ABNORMAL HIGH (ref 0–129)
Triglycerides: 100 mg/dL (ref 0–149)
VLDL Cholesterol Cal: 18 mg/dL (ref 5–40)

## 2022-08-09 ENCOUNTER — Other Ambulatory Visit: Payer: Self-pay

## 2022-08-09 DIAGNOSIS — R29818 Other symptoms and signs involving the nervous system: Secondary | ICD-10-CM

## 2022-08-09 DIAGNOSIS — F17210 Nicotine dependence, cigarettes, uncomplicated: Secondary | ICD-10-CM

## 2022-08-09 MED ORDER — ALBUTEROL SULFATE HFA 108 (90 BASE) MCG/ACT IN AERS: 2.0000 | INHALATION_SPRAY | RESPIRATORY_TRACT | 3 refills | Status: AC | PRN

## 2022-08-12 ENCOUNTER — Other Ambulatory Visit: Payer: Self-pay | Admitting: Family Medicine

## 2022-08-12 DIAGNOSIS — E119 Type 2 diabetes mellitus without complications: Secondary | ICD-10-CM

## 2022-08-12 DIAGNOSIS — I5032 Chronic diastolic (congestive) heart failure: Secondary | ICD-10-CM

## 2022-08-12 DIAGNOSIS — J438 Other emphysema: Secondary | ICD-10-CM

## 2022-08-12 DIAGNOSIS — M48061 Spinal stenosis, lumbar region without neurogenic claudication: Secondary | ICD-10-CM

## 2022-08-12 DIAGNOSIS — I10 Essential (primary) hypertension: Secondary | ICD-10-CM

## 2022-08-12 NOTE — Telephone Encounter (Signed)
Medication Refill - Medication:  Rx #: [id number]  amLODipine (NORVASC) 10 MG tablet [[id number]]   Rx #: [id number]  DULoxetine (CYMBALTA) 60 MG capsule [[id number]]   Rx #: [id number]  furosemide (LASIX) 40 MG tablet [[id number]]  Rx #: [id number]  ipratropium-albuterol (DUONEB) 0.5-2.5 (3) MG/3ML SOLN [[id number]]   Rx #: [id number]  lisinopril (ZESTRIL) 40 MG tablet [[id number]]   Rx #: [id number]  Semaglutide,0.25 or 0.5MG /DOS, 2 MG/3ML SOPN [[id number]]   Rx #: [id number]  spironolactone (ALDACTONE) 25 MG tablet [[id number]]   Rx #: [id number]  tiZANidine (ZANAFLEX) 4 MG tablet [[id number]]   Rx #: [id number]  Fluticasone-Umeclidin-Vilant (TRELEGY ELLIPTA) 100-62.5-25 MCG/ACT AEPB [[id number]]   Has the patient contacted their pharmacy? Yes.  Pharmacy calling  (Agent: If no, request that the patient contact the pharmacy for the refill. If patient does not wish to contact the pharmacy document the reason why and proceed with request.) (Agent: If yes, when and what did the pharmacy advise?)  Preferred Pharmacy (with phone number or street name):  [place], [place]  [place] [address]  Phone: [phone] Fax: [phone]  Hours: Not open 24 hours   Has the patient been seen for an appointment in the last year OR does the patient have an upcoming appointment? Yes.    Agent: Please be advised that RX refills may take up to 3 business days. We ask that you follow-up with your pharmacy.

## 2022-08-13 NOTE — Telephone Encounter (Signed)
Requested medication (s) are due for refill today: No  Requested medication (s) are on the active medication list: Yes  Last refill:  08/05/22 to [facility]  Future visit scheduled: Yes  Notes to clinic:  Left message for pt. To let practice know if she needs these to be sent to [facility].    Requested Prescriptions  Pending Prescriptions Disp Refills   amLODipine (NORVASC) 10 MG tablet 90 tablet 1    Sig: Take 1 tablet (10 mg total) by mouth daily.     Cardiovascular: Calcium Channel Blockers 2 Passed - 08/12/2022  3:36 PM      Passed - Last BP in normal range    BP Readings from Last 1 Encounters:  08/05/22 112/72         Passed - Last Heart Rate in normal range    Pulse Readings from Last 1 Encounters:  08/05/22 88         Passed - Valid encounter within last 6 months    Recent Outpatient Visits           1 week ago Type 2 diabetes mellitus without complication, without long-term current use of insulin ([place])   [facility] [place], Charlane Ferretti, MD   1 year ago Encounter for [employer] annual wellness exam   [place], Charlane Ferretti, MD   1 year ago Type 2 diabetes mellitus without complication, without long-term current use of insulin ([place])   [place], Enobong, MD   1 year ago Lumbar radiculopathy   [place], [place], [place]   2 years ago Erroneous encounter - disregard   [place], Charlane Ferretti, MD       Future Appointments             In 3 months Charlott Rakes, MD [place]             DULoxetine (CYMBALTA) 60 MG capsule 90 capsule 1    Sig: Take 1 capsule (60 mg total) by mouth daily. For back pain     Psychiatry: Antidepressants - SNRI - duloxetine Failed - 08/12/2022  3:36 PM      Failed - Cr in normal range and within 360  days    Creat  Date Value Ref Range Status  01/19/2016 0.84 0.50 - 0.99 mg/dL Final   Creatinine, Ser  Date Value Ref Range Status  08/05/2022 1.27 (H) 0.57 - 1.00 mg/dL Final         Failed - Completed PHQ-2 or PHQ-9 in the last 360 days      Passed - eGFR is 30 or above and within 360 days    GFR, Est African American  Date Value Ref Range Status  01/19/2016 87 >=60 mL/min Final   GFR calc Af Amer  Date Value Ref Range Status  05/14/2020 64 >59 mL/min/1.73 Final    Comment:    **[facility] currently reports eGFR in compliance with the current**   recommendations of the [employer]. [facility] will   update reporting as new guidelines are published from the NKF-ASN   Task force.    GFR, Est Non African American  Date Value Ref Range Status  01/19/2016 76 >=60 mL/min Final    Comment:      The estimated GFR is a calculation valid for adults (>=19 years old) that  uses the CKD-EPI algorithm to adjust for age and sex. It is   not to be used for children, pregnant women, hospitalized patients,    patients on dialysis, or with rapidly changing kidney function. According to the NKDEP, eGFR >89 is normal, 60-89 shows mild impairment, 30-59 shows moderate impairment, 15-29 shows severe impairment and <15 is ESRD.      GFR calc non Af Amer  Date Value Ref Range Status  05/14/2020 55 (L) >59 mL/min/1.73 Final   eGFR  Date Value Ref Range Status  08/05/2022 46 (L) >59 mL/min/1.73 Final         Passed - Last BP in normal range    BP Readings from Last 1 Encounters:  08/05/22 112/72         Passed - Valid encounter within last 6 months    Recent Outpatient Visits           1 week ago Type 2 diabetes mellitus without complication, without long-term current use of insulin ([place])   [facility] [place], Charlane Ferretti, MD   1 year ago Encounter for [employer] annual wellness exam   [place], Charlane Ferretti, MD    1 year ago Type 2 diabetes mellitus without complication, without long-term current use of insulin ([place])   [place], Enobong, MD   1 year ago Lumbar radiculopathy   [place], [place], [place]   2 years ago Erroneous encounter - disregard   [place], Charlane Ferretti, MD       Future Appointments             In 3 months Charlott Rakes, MD [place]             furosemide (LASIX) 40 MG tablet 90 tablet 1    Sig: Take 1 tablet (40 mg total) by mouth daily.     Cardiovascular:  Diuretics - Loop Failed - 08/12/2022  3:36 PM      Failed - Cr in normal range and within 180 days    Creat  Date Value Ref Range Status  01/19/2016 0.84 0.50 - 0.99 mg/dL Final   Creatinine, Ser  Date Value Ref Range Status  08/05/2022 1.27 (H) 0.57 - 1.00 mg/dL Final         Failed - Mg Level in normal range and within 180 days    No results found for: "MG"       Passed - K in normal range and within 180 days    Potassium  Date Value Ref Range Status  08/05/2022 4.5 3.5 - 5.2 mmol/L Final         Passed - Ca in normal range and within 180 days    Calcium  Date Value Ref Range Status  08/05/2022 9.1 8.7 - 10.3 mg/dL Final   Calcium, Ion  Date Value Ref Range Status  05/15/2008 0.97 (L)  Final         Passed - Na in normal range and within 180 days    Sodium  Date Value Ref Range Status  08/05/2022 143 134 - 144 mmol/L Final         Passed - Cl in normal range and within 180 days    Chloride  Date Value Ref Range Status  08/05/2022 101 96 - 106 mmol/L Final  Passed - Last BP in normal range    BP Readings from Last 1 Encounters:  08/05/22 112/72         Passed - Valid encounter within last 6 months    Recent Outpatient Visits           1 week ago Type 2 diabetes mellitus without complication, without long-term current use of  insulin ([place])   [facility] [place], Charlane Ferretti, MD   1 year ago Encounter for [employer] annual wellness exam   [place], Charlane Ferretti, MD   1 year ago Type 2 diabetes mellitus without complication, without long-term current use of insulin ([place])   [place], Enobong, MD   1 year ago Lumbar radiculopathy   [place], [place], [place]   2 years ago Erroneous encounter - disregard   [place], Charlane Ferretti, MD       Future Appointments             In 3 months Charlott Rakes, MD [place]             ipratropium-albuterol (DUONEB) 0.5-2.5 (3) MG/3ML SOLN 360 mL 11    Sig: Take 3 mLs by nebulization every 6 (six) hours as needed.     Pulmonology:  Combination Products - albuterol / ipratropium Passed - 08/12/2022  3:36 PM      Passed - Last BP in normal range    BP Readings from Last 1 Encounters:  08/05/22 112/72         Passed - Last Heart Rate in normal range    Pulse Readings from Last 1 Encounters:  08/05/22 88         Passed - Valid encounter within last 12 months    Recent Outpatient Visits           1 week ago Type 2 diabetes mellitus without complication, without long-term current use of insulin ([place])   [facility] [place], Charlane Ferretti, MD   1 year ago Encounter for [employer] annual wellness exam   [place], [place], MD   1 year ago Type 2 diabetes mellitus without complication, without long-term current use of insulin ([place])   [place], Enobong, MD   1 year ago Lumbar radiculopathy   [place], [place], [place]   2 years ago Erroneous encounter - disregard   [place], Charlane Ferretti, MD        Future Appointments             In 3 months Charlott Rakes, MD [place]             lisinopril (ZESTRIL) 40 MG tablet 90 tablet 1    Sig: Take 1 tablet (40 mg total) by mouth daily to lower blood pressure.     Cardiovascular:  ACE Inhibitors Failed - 08/12/2022  3:36 PM      Failed - Cr in normal range and within 180 days    Creat  Date Value Ref Range Status  01/19/2016 0.84 0.50 - 0.99 mg/dL Final   Creatinine, Ser  Date Value Ref Range Status  08/05/2022 1.27 (H) 0.57 - 1.00 mg/dL Final  Passed - K in normal range and within 180 days    Potassium  Date Value Ref Range Status  08/05/2022 4.5 3.5 - 5.2 mmol/L Final         Passed - Patient is not pregnant      Passed - Last BP in normal range    BP Readings from Last 1 Encounters:  08/05/22 112/72         Passed - Valid encounter within last 6 months    Recent Outpatient Visits           1 week ago Type 2 diabetes mellitus without complication, without long-term current use of insulin ([place])   [facility] [place], Charlane Ferretti, MD   1 year ago Encounter for [employer] annual wellness exam   [place], Charlane Ferretti, MD   1 year ago Type 2 diabetes mellitus without complication, without long-term current use of insulin ([place])   [place], Enobong, MD   1 year ago Lumbar radiculopathy   [place], [place], [place]   2 years ago Erroneous encounter - disregard   [place], Charlane Ferretti, MD       Future Appointments             In 3 months Charlott Rakes, MD [place]             Semaglutide,0.25 or 0.5MG /DOS, 2 MG/3ML SOPN 3 mL 3    Sig: Inject 0.25 mg into the skin once a week.     Endocrinology:  Diabetes - GLP-1 Receptor Agonists - semaglutide Failed -  08/12/2022  3:36 PM      Failed - Cr in normal range and within 360 days    Creat  Date Value Ref Range Status  01/19/2016 0.84 0.50 - 0.99 mg/dL Final   Creatinine, Ser  Date Value Ref Range Status  08/05/2022 1.27 (H) 0.57 - 1.00 mg/dL Final         Passed - HBA1C in normal range and within 180 days    HbA1c, POC (controlled diabetic range)  Date Value Ref Range Status  08/05/2022 7.0 0.0 - 7.0 % Final         Passed - Valid encounter within last 6 months    Recent Outpatient Visits           1 week ago Type 2 diabetes mellitus without complication, without long-term current use of insulin ([place])   [facility] [place], Charlane Ferretti, MD   1 year ago Encounter for [employer] annual wellness exam   [place], [place], MD   1 year ago Type 2 diabetes mellitus without complication, without long-term current use of insulin ([place])   [place], Enobong, MD   1 year ago Lumbar radiculopathy   [place], [place], [place]   2 years ago Erroneous encounter - disregard   [place], Enobong, MD       Future Appointments             In 3 months Charlott Rakes, MD [place]             spironolactone (ALDACTONE) 25 MG tablet 90 tablet 1    Sig: Take 1  tablet (25 mg total) by mouth daily.     Cardiovascular: Diuretics - Aldosterone Antagonist Failed - 08/12/2022  3:36 PM      Failed - Cr in normal range and within 180 days    Creat  Date Value Ref Range Status  01/19/2016 0.84 0.50 - 0.99 mg/dL Final   Creatinine, Ser  Date Value Ref Range Status  08/05/2022 1.27 (H) 0.57 - 1.00 mg/dL Final         Passed - K in normal range and within 180 days    Potassium  Date Value Ref Range Status  08/05/2022 4.5 3.5 - 5.2 mmol/L Final         Passed - Na in normal range and  within 180 days    Sodium  Date Value Ref Range Status  08/05/2022 143 134 - 144 mmol/L Final         Passed - eGFR is 30 or above and within 180 days    GFR, Est African American  Date Value Ref Range Status  01/19/2016 87 >=60 mL/min Final   GFR calc Af Amer  Date Value Ref Range Status  05/14/2020 64 >59 mL/min/1.73 Final    Comment:    **[facility] currently reports eGFR in compliance with the current**   recommendations of the [employer]. [facility] will   update reporting as new guidelines are published from the NKF-ASN   Task force.    GFR, Est Non African American  Date Value Ref Range Status  01/19/2016 76 >=60 mL/min Final    Comment:      The estimated GFR is a calculation valid for adults (>=63 years old) that uses the CKD-EPI algorithm to adjust for age and sex. It is   not to be used for children, pregnant women, hospitalized patients,    patients on dialysis, or with rapidly changing kidney function. According to the NKDEP, eGFR >89 is normal, 60-89 shows mild impairment, 30-59 shows moderate impairment, 15-29 shows severe impairment and <15 is ESRD.      GFR calc non Af Amer  Date Value Ref Range Status  05/14/2020 55 (L) >59 mL/min/1.73 Final   eGFR  Date Value Ref Range Status  08/05/2022 46 (L) >59 mL/min/1.73 Final         Passed - Last BP in normal range    BP Readings from Last 1 Encounters:  08/05/22 112/72         Passed - Valid encounter within last 6 months    Recent Outpatient Visits           1 week ago Type 2 diabetes mellitus without complication, without long-term current use of insulin ([place])   [facility] [place], Charlane Ferretti, MD   1 year ago Encounter for [employer] annual wellness exam   [place], Charlane Ferretti, MD   1 year ago Type 2 diabetes mellitus without complication, without long-term current use of insulin ([place])   [place], Enobong, MD   1 year ago Lumbar radiculopathy   [place], [place], [place]   2 years ago Erroneous encounter - disregard   [place], MD       Future Appointments             In 3 months Charlott Rakes, MD [place]  tiZANidine (ZANAFLEX) 4 MG tablet 90 tablet 6    Sig: Take 1 tablet (4 mg total) by mouth every 8 (eight) hours as needed for muscle spasms.     Not Delegated - Cardiovascular:  Alpha-2 Agonists - tizanidine Failed - 08/12/2022  3:36 PM      Failed - This refill cannot be delegated      Passed - Valid encounter within last 6 months    Recent Outpatient Visits           1 week ago Type 2 diabetes mellitus without complication, without long-term current use of insulin ([place])   [facility] [place], Charlane Ferretti, MD   1 year ago Encounter for [employer] annual wellness exam   [place], Charlane Ferretti, MD   1 year ago Type 2 diabetes mellitus without complication, without long-term current use of insulin ([place])   [place], Enobong, MD   1 year ago Lumbar radiculopathy   [place], [place], [place]   2 years ago Erroneous encounter - disregard   [place], Enobong, MD       Future Appointments             In 3 months Charlott Rakes, MD [place]             Fluticasone-Umeclidin-Vilant (TRELEGY ELLIPTA) 100-62.5-25 MCG/ACT AEPB 60 each 6    Sig: Inhale 1 puff into the lungs daily.     Off-Protocol Failed - 08/12/2022  3:36 PM      Failed - Medication not assigned to a protocol, review manually.      Passed - Valid encounter within last 12 months    Recent Outpatient Visits           1 week ago Type 2  diabetes mellitus without complication, without long-term current use of insulin ([place])   [facility] [place], Charlane Ferretti, MD   1 year ago Encounter for [employer] annual wellness exam   [place], Charlane Ferretti, MD   1 year ago Type 2 diabetes mellitus without complication, without long-term current use of insulin [facility])   [place], Enobong, MD   1 year ago Lumbar radiculopathy   [place], [place], [place]   2 years ago Erroneous encounter - disregard   [place], MD       Future Appointments             In 3 months Charlott Rakes, MD [place]

## 2022-08-18 ENCOUNTER — Telehealth: Payer: Self-pay | Admitting: Emergency Medicine

## 2022-08-18 NOTE — Telephone Encounter (Signed)
Copied from [facility]. Topic: Referral - Status >> Aug 18, 2022  2:23 PM Chapman Fitch wrote: Reason for CRM: pt has [facility] and they require that the PCP  handle pre authorization for the sleep study / please advise

## 2022-08-19 NOTE — Telephone Encounter (Signed)
Noted-   PA started.

## 2022-08-25 ENCOUNTER — Other Ambulatory Visit: Payer: Self-pay | Admitting: Family Medicine

## 2022-08-25 DIAGNOSIS — M48061 Spinal stenosis, lumbar region without neurogenic claudication: Secondary | ICD-10-CM

## 2022-08-25 DIAGNOSIS — J438 Other emphysema: Secondary | ICD-10-CM

## 2022-08-25 DIAGNOSIS — I10 Essential (primary) hypertension: Secondary | ICD-10-CM

## 2022-08-25 DIAGNOSIS — E119 Type 2 diabetes mellitus without complications: Secondary | ICD-10-CM

## 2022-08-25 DIAGNOSIS — I5032 Chronic diastolic (congestive) heart failure: Secondary | ICD-10-CM

## 2022-08-25 NOTE — Telephone Encounter (Signed)
Requested medication (s) are due for refill today:unsure  Requested medication (s) are on the active medication list:yes  Last refill:  08/05/22  Future visit scheduled:no  Notes to clinic:  Unable to refill per protocol, last refill by provider 08/05/22 and refills were sent to [facility]. [facility] is requesting the medications, routing for review.     Requested Prescriptions  Pending Prescriptions Disp Refills   tiZANidine (ZANAFLEX) 4 MG tablet 90 tablet 6    Sig: Take 1 tablet (4 mg total) by mouth every 8 (eight) hours as needed for muscle spasms.     Not Delegated - Cardiovascular:  Alpha-2 Agonists - tizanidine Failed - 08/25/2022  1:08 PM      Failed - This refill cannot be delegated      Passed - Valid encounter within last 6 months    Recent Outpatient Visits           2 weeks ago Type 2 diabetes mellitus without complication, without long-term current use of insulin ([place])   [facility] [place], Charlane Ferretti, MD   1 year ago Encounter for [employer] annual wellness exam   [place], Charlane Ferretti, MD   1 year ago Type 2 diabetes mellitus without complication, without long-term current use of insulin ([place])   [place], Enobong, MD   1 year ago Lumbar radiculopathy   [place], [place], [place]   2 years ago Erroneous encounter - disregard   [place], Charlane Ferretti, MD       Future Appointments             In 2 months Charlott Rakes, MD [place]             Semaglutide,0.25 or 0.5MG/DOS, 2 MG/3ML SOPN 3 mL 3    Sig: Inject 0.25 mg into the skin once a week.     Endocrinology:  Diabetes - GLP-1 Receptor Agonists - semaglutide Failed - 08/25/2022  1:08 PM      Failed - Cr in normal range and within 360 days    Creat  Date Value Ref Range Status   01/19/2016 0.84 0.50 - 0.99 mg/dL Final   Creatinine, Ser  Date Value Ref Range Status  08/05/2022 1.27 (H) 0.57 - 1.00 mg/dL Final         Passed - HBA1C in normal range and within 180 days    HbA1c, POC (controlled diabetic range)  Date Value Ref Range Status  08/05/2022 7.0 0.0 - 7.0 % Final         Passed - Valid encounter within last 6 months    Recent Outpatient Visits           2 weeks ago Type 2 diabetes mellitus without complication, without long-term current use of insulin ([place])   [facility] [place], Charlane Ferretti, MD   1 year ago Encounter for [employer] annual wellness exam   [place], [place], MD   1 year ago Type 2 diabetes mellitus without complication, without long-term current use of insulin ([place])   [place], Enobong, MD   1 year ago Lumbar radiculopathy   [place], [place]   2 years ago Erroneous encounter - disregard   [facility] Charlott Rakes, MD       Future Appointments             In 2 months Charlott Rakes, MD [place]             lisinopril (ZESTRIL) 40 MG tablet 90 tablet 1    Sig: Take 1 tablet (40 mg total) by mouth daily to lower blood pressure.     Cardiovascular:  ACE Inhibitors Failed - 08/25/2022  1:08 PM      Failed - Cr in normal range and within 180 days    Creat  Date Value Ref Range Status  01/19/2016 0.84 0.50 - 0.99 mg/dL Final   Creatinine, Ser  Date Value Ref Range Status  08/05/2022 1.27 (H) 0.57 - 1.00 mg/dL Final         Passed - K in normal range and within 180 days    Potassium  Date Value Ref Range Status  08/05/2022 4.5 3.5 - 5.2 mmol/L Final         Passed - Patient is not pregnant      Passed - Last BP in normal range    BP Readings from Last 1 Encounters:  08/05/22 112/72          Passed - Valid encounter within last 6 months    Recent Outpatient Visits           2 weeks ago Type 2 diabetes mellitus without complication, without long-term current use of insulin ([place])   [facility] [place], Charlane Ferretti, MD   1 year ago Encounter for [employer] annual wellness exam   [place], [place], MD   1 year ago Type 2 diabetes mellitus without complication, without long-term current use of insulin ([place])   [place], Enobong, MD   1 year ago Lumbar radiculopathy   [place], [place], [place]   2 years ago Erroneous encounter - disregard   [place], Enobong, MD       Future Appointments             In 2 months Charlott Rakes, MD [place]             Fluticasone-Umeclidin-Vilant (TRELEGY ELLIPTA) 100-62.5-25 MCG/ACT AEPB 60 each 6    Sig: Inhale 1 puff into the lungs daily.     Off-Protocol Failed - 08/25/2022  1:08 PM      Failed - Medication not assigned to a protocol, review manually.      Passed - Valid encounter within last 12 months    Recent Outpatient Visits           2 weeks ago Type 2 diabetes mellitus without complication, without long-term current use of insulin ([place])   [facility] [place], Charlane Ferretti, MD   1 year ago Encounter for [employer] annual wellness exam   [place], Charlane Ferretti, MD   1 year ago Type 2 diabetes mellitus without complication, without long-term current use of insulin [facility])   [place], Enobong, MD   1 year ago Lumbar radiculopathy   [place], [place], [place]   2 years ago Erroneous encounter - disregard   [facility] Charlott Rakes, MD  Future Appointments             In 2 months Charlott Rakes, MD [place]             ipratropium-albuterol (DUONEB) 0.5-2.5 (3) MG/3ML SOLN 360 mL 11    Sig: Take 3 mLs by nebulization every 6 (six) hours as needed.     Pulmonology:  Combination Products - albuterol / ipratropium Passed - 08/25/2022  1:08 PM      Passed - Last BP in normal range    BP Readings from Last 1 Encounters:  08/05/22 112/72         Passed - Last Heart Rate in normal range    Pulse Readings from Last 1 Encounters:  08/05/22 88         Passed - Valid encounter within last 12 months    Recent Outpatient Visits           2 weeks ago Type 2 diabetes mellitus without complication, without long-term current use of insulin ([place])   [facility] [place], Charlane Ferretti, MD   1 year ago Encounter for [employer] annual wellness exam   [place], [place], MD   1 year ago Type 2 diabetes mellitus without complication, without long-term current use of insulin ([place])   [place], Enobong, MD   1 year ago Lumbar radiculopathy   [place], [place], [place]   2 years ago Erroneous encounter - disregard   [place], Charlane Ferretti, MD       Future Appointments             In 2 months Charlott Rakes, MD [place]             furosemide (LASIX) 40 MG tablet 90 tablet 1    Sig: Take 1 tablet (40 mg total) by mouth daily.     Cardiovascular:  Diuretics - Loop Failed - 08/25/2022  1:08 PM      Failed - Cr in normal range and within 180 days    Creat  Date Value Ref Range Status  01/19/2016 0.84 0.50 - 0.99 mg/dL Final   Creatinine, Ser  Date Value Ref Range Status  08/05/2022 1.27 (H) 0.57 - 1.00 mg/dL Final         Failed - Mg Level in normal range and within 180 days     No results found for: "MG"       Passed - K in normal range and within 180 days    Potassium  Date Value Ref Range Status  08/05/2022 4.5 3.5 - 5.2 mmol/L Final         Passed - Ca in normal range and within 180 days    Calcium  Date Value Ref Range Status  08/05/2022 9.1 8.7 - 10.3 mg/dL Final   Calcium, Ion  Date Value Ref Range Status  05/15/2008 0.97 (L)  Final         Passed - Na in normal range and within 180 days    Sodium  Date Value Ref Range Status  08/05/2022 143 134 - 144 mmol/L Final         Passed - Cl in normal range and within 180 days    Chloride  Date Value Ref Range Status  08/05/2022 101 96 - 106 mmol/L Final  Passed - Last BP in normal range    BP Readings from Last 1 Encounters:  08/05/22 112/72         Passed - Valid encounter within last 6 months    Recent Outpatient Visits           2 weeks ago Type 2 diabetes mellitus without complication, without long-term current use of insulin ([place])   [facility] [place], Charlane Ferretti, MD   1 year ago Encounter for [employer] annual wellness exam   [place], Charlane Ferretti, MD   1 year ago Type 2 diabetes mellitus without complication, without long-term current use of insulin ([place])   [place], Enobong, MD   1 year ago Lumbar radiculopathy   [place], [place], [place]   2 years ago Erroneous encounter - disregard   [place], Charlane Ferretti, MD       Future Appointments             In 2 months Charlott Rakes, MD [place]             DULoxetine (CYMBALTA) 60 MG capsule 90 capsule 1    Sig: Take 1 capsule (60 mg total) by mouth daily. For back pain     Psychiatry: Antidepressants - SNRI - duloxetine Failed - 08/25/2022  1:08 PM      Failed - Cr in normal range and within 360 days     Creat  Date Value Ref Range Status  01/19/2016 0.84 0.50 - 0.99 mg/dL Final   Creatinine, Ser  Date Value Ref Range Status  08/05/2022 1.27 (H) 0.57 - 1.00 mg/dL Final         Failed - Completed PHQ-2 or PHQ-9 in the last 360 days      Passed - eGFR is 30 or above and within 360 days    GFR, Est African American  Date Value Ref Range Status  01/19/2016 87 >=60 mL/min Final   GFR calc Af Amer  Date Value Ref Range Status  05/14/2020 64 >59 mL/min/1.73 Final    Comment:    **[facility] currently reports eGFR in compliance with the current**   recommendations of the [employer]. [facility] will   update reporting as new guidelines are published from the NKF-ASN   Task force.    GFR, Est Non African American  Date Value Ref Range Status  01/19/2016 76 >=60 mL/min Final    Comment:      The estimated GFR is a calculation valid for adults (>=63 years old) that uses the CKD-EPI algorithm to adjust for age and sex. It is   not to be used for children, pregnant women, hospitalized patients,    patients on dialysis, or with rapidly changing kidney function. According to the NKDEP, eGFR >89 is normal, 60-89 shows mild impairment, 30-59 shows moderate impairment, 15-29 shows severe impairment and <15 is ESRD.      GFR calc non Af Amer  Date Value Ref Range Status  05/14/2020 55 (L) >59 mL/min/1.73 Final   eGFR  Date Value Ref Range Status  08/05/2022 46 (L) >59 mL/min/1.73 Final         Passed - Last BP in normal range    BP Readings from Last 1 Encounters:  08/05/22 112/72         Passed - Valid encounter within  last 6 months    Recent Outpatient Visits           2 weeks ago Type 2 diabetes mellitus without complication, without long-term current use of insulin ([place])   [facility] [place], Charlane Ferretti, MD   1 year ago Encounter for [employer] annual wellness exam   [place], Charlane Ferretti, MD   1  year ago Type 2 diabetes mellitus without complication, without long-term current use of insulin ([place])   [place], Enobong, MD   1 year ago Lumbar radiculopathy   [place], Dionne [place], [place]   2 years ago Erroneous encounter - disregard   [place], Charlane Ferretti, MD       Future Appointments             In 2 months Charlott Rakes, MD [place]             amLODipine (NORVASC) 10 MG tablet 90 tablet 1    Sig: Take 1 tablet (10 mg total) by mouth daily.     Cardiovascular: Calcium Channel Blockers 2 Passed - 08/25/2022  1:08 PM      Passed - Last BP in normal range    BP Readings from Last 1 Encounters:  08/05/22 112/72         Passed - Last Heart Rate in normal range    Pulse Readings from Last 1 Encounters:  08/05/22 88         Passed - Valid encounter within last 6 months    Recent Outpatient Visits           2 weeks ago Type 2 diabetes mellitus without complication, without long-term current use of insulin ([place])   [facility] [place], Charlane Ferretti, MD   1 year ago Encounter for [employer] annual wellness exam   [place], Charlane Ferretti, MD   1 year ago Type 2 diabetes mellitus without complication, without long-term current use of insulin ([place])   [place], Enobong, MD   1 year ago Lumbar radiculopathy   [place], [place], [place]   2 years ago Erroneous encounter - disregard   [place], MD       Future Appointments             In 2 months Charlott Rakes, MD [place]

## 2022-08-25 NOTE — Telephone Encounter (Signed)
Medication Refill - Medication:  amLODIPine Besylate 10 mg   DULoxetine HCl  Furosemide 40 mg Oral Daily  Ipratropium-Albuterol 3 mLs Lisinopril 40 MG  Semaglutide  25 mg Oral Daily  tiZANidine HCl 4 mg  Fluticasone-Umeclidin-Vilant  Has the patient contacted their pharmacy? yes (Agent: If no, request that the patient contact the pharmacy for the refill. If patient does not wish to contact the pharmacy document the reason why and proceed with request.) (Agent: If yes, when and what did the pharmacy advise?)  Preferred Pharmacy (with phone number or street name):  [facility] [place] Phone:  [phone]  Fax:  [phone]     Has the patient been seen for an appointment in the last year OR does the patient have an upcoming appointment? yes  Agent: Please be advised that RX refills may take up to 3 business days. We ask that you follow-up with your pharmacy.

## 2022-08-27 ENCOUNTER — Other Ambulatory Visit: Payer: Self-pay

## 2022-08-27 DIAGNOSIS — I5032 Chronic diastolic (congestive) heart failure: Secondary | ICD-10-CM

## 2022-08-27 DIAGNOSIS — I10 Essential (primary) hypertension: Secondary | ICD-10-CM

## 2022-08-27 DIAGNOSIS — J438 Other emphysema: Secondary | ICD-10-CM

## 2022-08-27 DIAGNOSIS — M48061 Spinal stenosis, lumbar region without neurogenic claudication: Secondary | ICD-10-CM

## 2022-08-27 DIAGNOSIS — E119 Type 2 diabetes mellitus without complications: Secondary | ICD-10-CM

## 2022-08-27 MED ORDER — LISINOPRIL 40 MG PO TABS: 40.0000 mg | ORAL_TABLET | Freq: Every day | ORAL | 1 refills | Status: AC

## 2022-08-27 MED ORDER — SEMAGLUTIDE(0.25 OR 0.5MG/DOS) 2 MG/3ML ~~LOC~~ SOPN: 0.2500 mg | PEN_INJECTOR | SUBCUTANEOUS | 3 refills | Status: DC

## 2022-08-27 MED ORDER — TRELEGY ELLIPTA 100-62.5-25 MCG/ACT IN AEPB: 1.0000 | INHALATION_SPRAY | Freq: Every day | RESPIRATORY_TRACT | 6 refills | Status: DC

## 2022-08-27 MED ORDER — FUROSEMIDE 40 MG PO TABS: 40.0000 mg | ORAL_TABLET | Freq: Every day | ORAL | 1 refills | Status: AC

## 2022-08-27 MED ORDER — SPIRONOLACTONE 25 MG PO TABS: 25.0000 mg | ORAL_TABLET | Freq: Every day | ORAL | 1 refills | Status: DC

## 2022-08-27 MED ORDER — IPRATROPIUM-ALBUTEROL 0.5-2.5 (3) MG/3ML IN SOLN: 3.0000 mL | Freq: Four times a day (QID) | RESPIRATORY_TRACT | 11 refills | Status: DC | PRN

## 2022-08-27 MED ORDER — TIZANIDINE HCL 4 MG PO TABS: 4.0000 mg | ORAL_TABLET | Freq: Three times a day (TID) | ORAL | 6 refills | Status: DC | PRN

## 2022-08-27 NOTE — Telephone Encounter (Signed)
Received fax from  [place]

## 2022-09-03 ENCOUNTER — Telehealth: Payer: Self-pay

## 2022-09-03 NOTE — Telephone Encounter (Signed)
Criteria was not met for sleep study fax has been received for PCP to complete.

## 2022-09-06 ENCOUNTER — Telehealth: Payer: Self-pay | Admitting: Emergency Medicine

## 2022-09-06 NOTE — Telephone Encounter (Signed)
Copied from [place] [phone]. Topic: General - Other >> Sep 06, 2022 12:50 PM Everette C wrote: Reason for CRM: Fe with [employer] on behalf of [employer] has called to speak with a member of clinical staff respoonsible for handling prior authorizations for sleep studies   Please contact Fe further when possible at [phone]  Case ID [id number]

## 2022-09-15 NOTE — Telephone Encounter (Signed)
Paperwork from [facility] has been faxed over with last office note.

## 2022-10-26 ENCOUNTER — Ambulatory Visit (HOSPITAL_BASED_OUTPATIENT_CLINIC_OR_DEPARTMENT_OTHER): Payer: Medicare HMO | Attending: Family Medicine | Admitting: Internal Medicine

## 2022-11-16 ENCOUNTER — Ambulatory Visit: Payer: Medicare HMO | Admitting: Family Medicine

## 2022-11-18 ENCOUNTER — Ambulatory Visit: Payer: Medicare HMO | Admitting: Family Medicine

## 2022-12-02 ENCOUNTER — Other Ambulatory Visit: Payer: Self-pay

## 2022-12-03 ENCOUNTER — Other Ambulatory Visit: Payer: Self-pay

## 2023-01-16 ENCOUNTER — Other Ambulatory Visit: Payer: Self-pay | Admitting: Family Medicine

## 2023-01-16 DIAGNOSIS — J438 Other emphysema: Secondary | ICD-10-CM

## 2023-01-17 NOTE — Telephone Encounter (Signed)
Requested medication (s) are due for refill today:   Yes  Requested medication (s) are on the active medication list:   Yes  Future visit scheduled:   No   Last ordered: 08/27/2022 60 each, 6 refills  Returned because there is not a protocol assigned to this medication     Requested Prescriptions  Pending Prescriptions Disp Refills   TRELEGY ELLIPTA 100-62.5-25 MCG/ACT AEPB [Pharmacy Med Name: TRELEGY ELLIPTA 100-62.5-25 MCG/ACT Aerosol Powder Breath Activated] 180 each 3    Sig: INHALE 1 PUFF EVERY DAY     Off-Protocol Failed - 01/16/2023  2:38 AM      Failed - Medication not assigned to a protocol, review manually.      Passed - Valid encounter within last 12 months    Recent Outpatient Visits           5 months ago Type 2 diabetes mellitus without complication, without long-term current use of insulin ([place])   [place], Charlane Ferretti, MD   1 year ago Encounter for Medicare annual wellness exam   [place], Charlane Ferretti, MD   1 year ago Type 2 diabetes mellitus without complication, without long-term current use of insulin [facility])   [place] Charlott Rakes, MD   2 years ago Lumbar radiculopathy   [place], [place]   2 years ago Erroneous encounter - disregard   [place] Charlott Rakes, MD

## 2023-01-22 ENCOUNTER — Other Ambulatory Visit: Payer: Self-pay | Admitting: Family Medicine

## 2023-01-22 DIAGNOSIS — E119 Type 2 diabetes mellitus without complications: Secondary | ICD-10-CM

## 2023-01-25 ENCOUNTER — Telehealth: Payer: Self-pay | Admitting: Family Medicine

## 2023-01-25 NOTE — Telephone Encounter (Signed)
Correction  Please call Robin @ [phone] to schedule AWV

## 2023-01-25 NOTE — Telephone Encounter (Signed)
Called patient to schedule Medicare Annual Wellness Visit (AWV). Left message for patient to call back and schedule Medicare Annual Wellness Visit (AWV).  Last date of AWV: 08/04/21  Please schedule an appointment at any time with [facility] or [employer].  If any questions, please contact me at [phone].  Thank you ,  Barkley Boards AWV direct phone # [phone]

## 2023-03-14 ENCOUNTER — Other Ambulatory Visit: Payer: Self-pay | Admitting: Family Medicine

## 2023-03-14 DIAGNOSIS — E119 Type 2 diabetes mellitus without complications: Secondary | ICD-10-CM

## 2023-03-17 ENCOUNTER — Telehealth: Payer: Self-pay | Admitting: Family Medicine

## 2023-03-17 NOTE — Telephone Encounter (Signed)
Called patient to schedule Medicare Annual Wellness Visit (AWV). Left message for patient to call back and schedule Medicare Annual Wellness Visit (AWV).  Last date of AWV: 08/04/21   If any questions, please contact me at [phone].  Thank you ,  Rudell Cobb AWV direct phone # [phone]

## 2023-03-28 ENCOUNTER — Telehealth: Payer: Self-pay | Admitting: Family Medicine

## 2023-03-28 NOTE — Telephone Encounter (Signed)
Called patient to schedule Medicare Annual Wellness Visit (AWV). Left message for patient to call back and schedule Medicare Annual Wellness Visit (AWV).  Last date of AWV: 08/04/2021   Please schedule an AWVS appointment at any time with [facility] VISIT .  If any questions, please contact me at [phone].    Thank you,  [facility] Support [facility] Direct dial  [phone]

## 2023-04-07 ENCOUNTER — Other Ambulatory Visit: Payer: Self-pay | Admitting: Family Medicine

## 2023-04-07 DIAGNOSIS — I10 Essential (primary) hypertension: Secondary | ICD-10-CM

## 2023-05-17 ENCOUNTER — Other Ambulatory Visit (HOSPITAL_COMMUNITY): Payer: Self-pay | Admitting: Family Medicine

## 2023-05-17 DIAGNOSIS — R0602 Shortness of breath: Secondary | ICD-10-CM

## 2023-05-17 DIAGNOSIS — F17200 Nicotine dependence, unspecified, uncomplicated: Secondary | ICD-10-CM

## 2023-06-03 ENCOUNTER — Encounter (HOSPITAL_COMMUNITY): Payer: Self-pay

## 2023-06-03 ENCOUNTER — Ambulatory Visit (HOSPITAL_COMMUNITY): Admission: RE | Admit: 2023-06-03 | Payer: Medicare HMO | Source: Ambulatory Visit

## 2023-06-03 ENCOUNTER — Ambulatory Visit (HOSPITAL_COMMUNITY)
Admission: RE | Admit: 2023-06-03 | Discharge: 2023-06-03 | Disposition: A | Discharge status: Home / Self Care | Payer: Medicare HMO | Source: Ambulatory Visit | Attending: Family Medicine | Admitting: Family Medicine

## 2023-06-03 DIAGNOSIS — I700 Atherosclerosis of aorta: Secondary | ICD-10-CM | POA: Insufficient documentation

## 2023-06-03 DIAGNOSIS — J439 Emphysema, unspecified: Secondary | ICD-10-CM | POA: Insufficient documentation

## 2023-06-03 DIAGNOSIS — R0602 Shortness of breath: Secondary | ICD-10-CM | POA: Diagnosis not present

## 2023-06-03 DIAGNOSIS — F17210 Nicotine dependence, cigarettes, uncomplicated: Secondary | ICD-10-CM | POA: Insufficient documentation

## 2023-06-03 DIAGNOSIS — Z122 Encounter for screening for malignant neoplasm of respiratory organs: Secondary | ICD-10-CM | POA: Diagnosis not present

## 2023-06-03 DIAGNOSIS — F17200 Nicotine dependence, unspecified, uncomplicated: Secondary | ICD-10-CM | POA: Insufficient documentation

## 2023-06-03 DIAGNOSIS — I2510 Atherosclerotic heart disease of native coronary artery without angina pectoris: Secondary | ICD-10-CM | POA: Insufficient documentation

## 2023-06-23 ENCOUNTER — Other Ambulatory Visit: Payer: Self-pay | Admitting: Family Medicine

## 2023-06-23 DIAGNOSIS — Z780 Asymptomatic menopausal state: Secondary | ICD-10-CM

## 2023-06-24 ENCOUNTER — Inpatient Hospital Stay: Admission: RE | Admit: 2023-06-24 | Payer: Medicare HMO | Source: Ambulatory Visit

## 2023-07-27 ENCOUNTER — Other Ambulatory Visit: Payer: Self-pay | Admitting: Surgery

## 2023-08-01 NOTE — Progress Notes (Signed)
COVID Vaccine received:  []  No []  Yes Date of any COVID positive Test in last 90 days:  PCP - Hoy Register, MD at [facility] Cardiologist -   Chest x-ray -  EKG -  (02-27-2019 Epic)  will repeat Stress Test -  ECHO - 06-30-2016   Epic Cardiac Cath -   PCR screen: []  Ordered & Completed           []   No Order but Needs PROFEND           [x]   N/A for this surgery  Surgery Plan:  [x]  Ambulatory   []  Outpatient in bed  []  Admit  Anesthesia:    []  General  []  Spinal  []   Choice [x]   MAC  Pacemaker / ICD device [x]  No []  Yes   Spinal Cord Stimulator:[x]  No []  Yes       History of Sleep Apnea? []  No [x]  Yes  ?  sleep study 08-09-2022 CPAP used?- []  No []  Yes    Does the patient monitor blood sugar?  []  No []  Yes  []  N/A  Patient has: []  NO Hx DM   []  Pre-DM   []  DM1  [x]   DM2 Last A1c was:7.0 on   08-05-2022   Does patient have a [employer] or Dexacom? []  No []  Yes   Fasting Blood Sugar Ranges-  Checks Blood Sugar _____ times a day  GLP1 agonist / usual dose - Ozempic , pt says she doesn't take GLP1 instructions:  SGLT-2 inhibitors / usual dose -  SGLT-2 instructions:   Diabetic medications/ instructions:   Blood Thinner / Instructions:  none Aspirin Instructions:  none  ERAS Protocol Ordered: []  No  [x]  Yes PRE-SURGERY []  ENSURE  []  G2   [x]  No Drink Ordered Patient is to be NPO after: 0630  Comments:   Activity level: Patient is able / unable to climb a flight of stairs without difficulty; []  No CP  []  No SOB, but would have ___   Patient can / can not perform ADLs without assistance.   Anesthesia review: HTN, Smoker, CHF, COPD, DM2- pt doesn't take either metformin or Ozempic now, Polycythemia  Patient denies shortness of breath, fever, cough and chest pain at PAT appointment.  Patient verbalized understanding and agreement to the Pre-Surgical Instructions that were given to them at this PAT appointment. Patient was also educated of the  need to review these PAT instructions again prior to her surgery.I reviewed the appropriate phone numbers to call if they have any and questions or concerns.

## 2023-08-01 NOTE — Patient Instructions (Signed)
SURGICAL WAITING ROOM VISITATION Patients having surgery or a procedure may have no more than 2 support people in the waiting area - these visitors may rotate in the visitor waiting room.   Due to an increase in RSV and influenza rates and associated hospitalizations, children ages 50 and under may not visit patients in [facility] hospitals. If the patient needs to stay at the hospital during part of their recovery, the visitor guidelines for inpatient rooms apply.  PRE-OP VISITATION  Pre-op nurse will coordinate an appropriate time for 1 support person to accompany the patient in pre-op.  This support person may not rotate.  This visitor will be contacted when the time is appropriate for the visitor to come back in the pre-op area.  Please refer to the [facility] website for the visitor guidelines for Inpatients (after your surgery is over and you are in a regular room).  You are not required to quarantine at this time prior to your surgery. However, you must do this: Hand Hygiene often Do NOT share personal items Notify your provider if you are in close contact with someone who has COVID or you develop fever 100.4 or greater, new onset of sneezing, cough, sore throat, shortness of breath or body aches.  If you test positive for Covid or have been in contact with anyone that has tested positive in the last 10 days please notify you surgeon.    Your procedure is scheduled on:  Friday  August 19, 2023  Report to [facility] Main Entrance: [place] entrance where the [employer] is available.   Report to admitting at: 07:15    AM  Call this number if you have any questions or problems the morning of surgery [phone]  Do not eat food after Midnight the night prior to your surgery/procedure.  After Midnight you may have the following liquids until    06:30     AM DAY OF SURGERY  Clear Liquid Diet Water Black Coffee (sugar ok, NO MILK/CREAM OR CREAMERS)  Tea (sugar ok,  NO MILK/CREAM OR CREAMERS) regular and decaf                             Plain Jell-O  with no fruit (NO RED)                                           Fruit ices (not with fruit pulp, NO RED)                                     Popsicles (NO RED)                                                                  Juice: NO CITRUS JUICES: only apple, WHITE grape, WHITE cranberry Sports drinks like Gatorade or Powerade (NO RED)                FOLLOW  ANY ADDITIONAL PRE OP INSTRUCTIONS YOU RECEIVED FROM YOUR SURGEON'S OFFICE!!!   Oral Hygiene  is also important to reduce your risk of infection.        Remember - BRUSH YOUR TEETH THE MORNING OF SURGERY WITH YOUR REGULAR TOOTHPASTE  Do NOT smoke after Midnight the night before surgery.  STOP TAKING all Vitamins, Herbs and supplements 1 week before your surgery.   Take ONLY these medicines the morning of surgery with A SIP OF WATER: amlodipine, cetirizine (Zyrtec),  If needed you may use your DuoNeb, Trilegy Ellipta, or Albuterol inhaler.     If You have been diagnosed with Sleep Apnea - Bring CPAP mask and tubing day of surgery. We will provide you with a CPAP machine on the day of your surgery.                   You may not have any metal on your body including hair pins, jewelry, and body piercing  Do not wear make-up, lotions, powders, perfumes / cologne, or deodorant  Do not wear nail polish including gel and S&S, artificial / acrylic nails, or any other type of covering on natural nails including finger and toenails. If you have artificial nails, gel coating, etc., that needs to be removed by a nail salon, Please have this removed prior to surgery. Not doing so may mean that your surgery could be cancelled or delayed if the Surgeon or anesthesia staff feels like they are unable to monitor you safely.   Do not shave 48 hours prior to surgery to avoid nicks in your skin which may contribute to postoperative infections.   Men may shave face  and neck.  Contacts, Hearing Aids, dentures or bridgework may not be worn into surgery. DENTURES WILL BE REMOVED PRIOR TO SURGERY PLEASE DO NOT APPLY "Poly grip" OR ADHESIVES!!!  You may bring a small overnight bag with you on the day of surgery, only pack items that are not valuable. [facility] IS NOT RESPONSIBLE   FOR VALUABLES THAT ARE LOST OR STOLEN.   Patients discharged on the day of surgery will not be allowed to drive home.  Someone NEEDS to stay with you for the first 24 hours after anesthesia.  Do not bring your home medications to the hospital. The Pharmacy will dispense medications listed on your medication list to you during your admission in the Hospital.  Special Instructions: Bring a copy of your healthcare power of attorney and living will documents the day of surgery, if you wish to have them scanned into your [facility] Records- EPIC  Please read over the following fact sheets you were given: IF YOU HAVE QUESTIONS ABOUT YOUR PRE-OP INSTRUCTIONS, PLEASE CALL [phone]   [facility] - Preparing for Surgery Before surgery, you can play an important role.  Because skin is not sterile, your skin needs to be as free of germs as possible.  You can reduce the number of germs on your skin by washing with CHG (chlorahexidine gluconate) soap before surgery.  CHG is an antiseptic cleaner which kills germs and bonds with the skin to continue killing germs even after washing. Please DO NOT use if you have an allergy to CHG or antibacterial soaps.  If your skin becomes reddened/irritated stop using the CHG and inform your nurse when you arrive at Short Stay. Do not shave (including legs and underarms) for at least 48 hours prior to the first CHG shower.  You may shave your face/neck.  Please follow these instructions carefully:  1.  Shower with CHG Soap the night before surgery  and the  morning of surgery.  2.  If you choose to wash your hair, wash your hair first as usual with  your normal  shampoo.  3.  After you shampoo, rinse your hair and body thoroughly to remove the shampoo.                             4.  Use CHG as you would any other liquid soap.  You can apply chg directly to the skin and wash.  Gently with a scrungie or clean washcloth.  5.  Apply the CHG Soap to your body ONLY FROM THE NECK DOWN.   Do not use on face/ open                           Wound or open sores. Avoid contact with eyes, ears mouth and genitals (private parts).                       Wash face,  Genitals (private parts) with your normal soap.             6.  Wash thoroughly, paying special attention to the area where your  surgery  will be performed.  7.  Thoroughly rinse your body with warm water from the neck down.  8.  DO NOT shower/wash with your normal soap after using and rinsing off the CHG Soap.            9.  Pat yourself dry with a clean towel.            10.  Wear clean pajamas.            11.  Place clean sheets on your bed the night of your first shower and do not  sleep with pets.  ON THE DAY OF SURGERY : Do not apply any lotions/deodorants the morning of surgery.  Please wear clean clothes to the hospital/surgery center.     FAILURE TO FOLLOW THESE INSTRUCTIONS MAY RESULT IN THE CANCELLATION OF YOUR SURGERY  PATIENT SIGNATURE_________________________________  NURSE SIGNATURE__________________________________  ________________________________________________________________________

## 2023-08-03 ENCOUNTER — Encounter (HOSPITAL_COMMUNITY)
Admission: RE | Admit: 2023-08-03 | Discharge: 2023-08-03 | Disposition: A | Discharge status: Home / Self Care | Payer: Medicare HMO | Source: Ambulatory Visit | Attending: Anesthesiology | Admitting: Anesthesiology

## 2023-08-03 DIAGNOSIS — E119 Type 2 diabetes mellitus without complications: Secondary | ICD-10-CM

## 2023-08-03 DIAGNOSIS — I5032 Chronic diastolic (congestive) heart failure: Secondary | ICD-10-CM

## 2023-08-03 DIAGNOSIS — I10 Essential (primary) hypertension: Secondary | ICD-10-CM

## 2023-08-09 NOTE — Patient Instructions (Addendum)
SURGICAL WAITING ROOM VISITATION Patients having surgery or a procedure may have no more than 2 support people in the waiting area - these visitors may rotate.    Children under the age of 63 must have an adult with them who is not the patient.  If the patient needs to stay at the hospital during part of their recovery, the visitor guidelines for inpatient rooms apply. Pre-op nurse will coordinate an appropriate time for 1 support person to accompany patient in pre-op.  This support person may not rotate.    Please refer to the [facility] website for the visitor guidelines for Inpatients (after your surgery is over and you are in a regular room).       Your procedure is scheduled on: 08-19-23   Report to [facility] Main Entrance    Report to admitting at 7:15 AM   Call this number if you have problems the morning of surgery [phone]   Do not eat food :After Midnight.   After Midnight you may have the following liquids until 6:30 AM DAY OF SURGERY  Water Non-Citrus Juices (without pulp, NO RED-Apple, White grape, White cranberry) Black Coffee (NO MILK/CREAM OR CREAMERS, sugar ok)  Clear Tea (NO MILK/CREAM OR CREAMERS, sugar ok) regular and decaf                             Plain Jell-O (NO RED)                                           Fruit ices (not with fruit pulp, NO RED)                                     Popsicles (NO RED)                                                               Sports drinks like Gatorade (NO RED)                       If you have questions, please contact your surgeon's office.   FOLLOW  ANY ADDITIONAL PRE OP INSTRUCTIONS YOU RECEIVED FROM YOUR SURGEON'S OFFICE!!!     Oral Hygiene is also important to reduce your risk of infection.                                    Remember - BRUSH YOUR TEETH THE MORNING OF SURGERY WITH YOUR REGULAR TOOTHPASTE   Do NOT smoke after Midnight   Take these medicines the morning of surgery with A SIP  OF WATER:   Amlodipine  Zyrtec  Okay to use inhalers  Tylenol if needed  Stop all vitamins and herbal supplements 7 days before surgery  DO NOT TAKE ANY ORAL DIABETIC MEDICATIONS DAY OF YOUR SURGERY  You may not have any metal on your body including hair pins, jewelry, and body piercing             Do not wear make-up, lotions, powders, perfumes or deodorant  Do not wear nail polish including gel and S&S, artificial/acrylic nails, or any other type of covering on natural nails including finger and toenails. If you have artificial nails, gel coating, etc. that needs to be removed by a nail salon please have this removed prior to surgery or surgery may need to be canceled/ delayed if the surgeon/ anesthesia feels like they are unable to be safely monitored.   Do not shave  48 hours prior to surgery.     Do not bring valuables to the hospital. [facility] IS NOT RESPONSIBLE   FOR VALUABLES.   Contacts, dentures or bridgework may not be worn into surgery.  DO NOT BRING YOUR HOME MEDICATIONS TO THE HOSPITAL. PHARMACY WILL DISPENSE MEDICATIONS LISTED ON YOUR MEDICATION LIST TO YOU DURING YOUR ADMISSION IN THE HOSPITAL!    Patients discharged on the day of surgery will not be allowed to drive home.  Someone NEEDS to stay with you for the first 24 hours after anesthesia.               Please read over the following fact sheets you were given: IF YOU HAVE QUESTIONS ABOUT YOUR PRE-OP INSTRUCTIONS PLEASE CALL [phone] [facility]  If you received a COVID test during your pre-op visit  it is requested that you wear a mask when out in public, stay away from anyone that may not be feeling well and notify your surgeon if you develop symptoms. If you test positive for Covid or have been in contact with anyone that has tested positive in the last 10 days please notify you surgeon.  [facility] - Preparing for Surgery Before surgery, you can play an important role.  Because  skin is not sterile, your skin needs to be as free of germs as possible.  You can reduce the number of germs on your skin by washing with CHG (chlorahexidine gluconate) soap before surgery.  CHG is an antiseptic cleaner which kills germs and bonds with the skin to continue killing germs even after washing. Please DO NOT use if you have an allergy to CHG or antibacterial soaps.  If your skin becomes reddened/irritated stop using the CHG and inform your nurse when you arrive at Short Stay. Do not shave (including legs and underarms) for at least 48 hours prior to the first CHG shower.  You may shave your face/neck.  Please follow these instructions carefully:  1.  Shower with CHG Soap the night before surgery and the  morning of surgery.  2.  If you choose to wash your hair, wash your hair first as usual with your normal  shampoo.  3.  After you shampoo, rinse your hair and body thoroughly to remove the shampoo.                             4.  Use CHG as you would any other liquid soap.  You can apply chg directly to the skin and wash.  Gently with a scrungie or clean washcloth.  5.  Apply the CHG Soap to your body ONLY FROM THE NECK DOWN.   Do   not use on face/ open  Wound or open sores. Avoid contact with eyes, ears mouth and   genitals (private parts).                       Wash face,  Genitals (private parts) with your normal soap.             6.  Wash thoroughly, paying special attention to the area where your    surgery  will be performed.  7.  Thoroughly rinse your body with warm water from the neck down.  8.  DO NOT shower/wash with your normal soap after using and rinsing off the CHG Soap.                9.  Pat yourself dry with a clean towel.            10.  Wear clean pajamas.            11.  Place clean sheets on your bed the night of your first shower and do not  sleep with pets. Day of Surgery : Do not apply any lotions/deodorants the morning of surgery.   Please wear clean clothes to the hospital/surgery center.  FAILURE TO FOLLOW THESE INSTRUCTIONS MAY RESULT IN THE CANCELLATION OF YOUR SURGERY  PATIENT SIGNATURE_________________________________  NURSE SIGNATURE__________________________________  ________________________________________________________________________

## 2023-08-10 NOTE — Progress Notes (Addendum)
COVID Vaccine Completed:  Date of COVID positive in last 90 days: N/A  PCP - Hoy Register, MD Cardiologist - N/A  Chest x-ray - CT chest 06-03-23 Epic EKG - 08-12-23 Epic Stress Test - N/A ECHO - 06-30-16 Epic Cardiac Cath - N/A Pacemaker/ICD device last checked: Spinal Cord Stimulator:N/A  Bowel Prep - N/A  Sleep Study - N/A CPAP -   Fasting Blood Sugar -  Checks Blood Sugar - does not check   Ozempic (does not take) Last dose of GLP1 agonist-  N/A GLP1 instructions:  N/A   Last dose of SGLT-2 inhibitors-  N/A SGLT-2 instructions: N/A  Blood Thinner Instructions:  N/A Aspirin Instructions: Last Dose:  Activity level:  Can go up a flight of stairs and perform activities of daily living but does have to stop and rest.  Has shortness of breath with exertion.  Denies chest pain.  States that the shortness of breath has not worsened.  Anesthesia review:  CHF, COPD, HTN, DM  O2 2-3L prn shortness of breath  Patient denies shortness of breath, fever, cough and chest pain at PAT appointment  Patient verbalized understanding of instructions that were given to them at the PAT appointment. Patient was also instructed that they will need to review over the PAT instructions again at home before surgery.

## 2023-08-12 ENCOUNTER — Encounter (HOSPITAL_COMMUNITY)
Admission: RE | Admit: 2023-08-12 | Discharge: 2023-08-12 | Disposition: A | Discharge status: Home / Self Care | Payer: Medicare HMO | Source: Ambulatory Visit | Attending: Surgery

## 2023-08-12 ENCOUNTER — Encounter (HOSPITAL_COMMUNITY): Payer: Self-pay

## 2023-08-12 ENCOUNTER — Other Ambulatory Visit: Payer: Self-pay

## 2023-08-12 DIAGNOSIS — Z01818 Encounter for other preprocedural examination: Secondary | ICD-10-CM | POA: Diagnosis present

## 2023-08-12 DIAGNOSIS — Z01812 Encounter for preprocedural laboratory examination: Secondary | ICD-10-CM | POA: Diagnosis not present

## 2023-08-12 DIAGNOSIS — I5032 Chronic diastolic (congestive) heart failure: Secondary | ICD-10-CM | POA: Diagnosis not present

## 2023-08-12 DIAGNOSIS — E119 Type 2 diabetes mellitus without complications: Secondary | ICD-10-CM | POA: Insufficient documentation

## 2023-08-12 DIAGNOSIS — I110 Hypertensive heart disease with heart failure: Secondary | ICD-10-CM | POA: Insufficient documentation

## 2023-08-12 DIAGNOSIS — R9431 Abnormal electrocardiogram [ECG] [EKG]: Secondary | ICD-10-CM | POA: Diagnosis not present

## 2023-08-12 DIAGNOSIS — I10 Essential (primary) hypertension: Secondary | ICD-10-CM

## 2023-08-12 DIAGNOSIS — Z01810 Encounter for preprocedural cardiovascular examination: Secondary | ICD-10-CM | POA: Insufficient documentation

## 2023-08-12 HISTORY — DX: Dependence on supplemental oxygen: Z99.81

## 2023-08-12 HISTORY — DX: Dyspnea, unspecified: R06.00

## 2023-08-12 HISTORY — DX: Plantar fascial fibromatosis: M72.2

## 2023-08-12 LAB — BASIC METABOLIC PANEL
Anion gap: 11 (ref 5–15)
BUN: 52 mg/dL — ABNORMAL HIGH (ref 8–23)
CO2: 21 mmol/L — ABNORMAL LOW (ref 22–32)
Calcium: 9.2 mg/dL (ref 8.9–10.3)
Chloride: 107 mmol/L (ref 98–111)
Creatinine, Ser: 1.53 mg/dL — ABNORMAL HIGH (ref 0.44–1.00)
GFR, Estimated: 37 mL/min — ABNORMAL LOW (ref >60)
Glucose, Bld: 90 mg/dL (ref 70–99)
Potassium: 4.7 mmol/L (ref 3.5–5.1)
Sodium: 139 mmol/L (ref 135–145)

## 2023-08-12 LAB — CBC
HCT: 53.9 % — ABNORMAL HIGH (ref 36.0–46.0)
Hemoglobin: 16.8 g/dL — ABNORMAL HIGH (ref 12.0–15.0)
MCH: 29 pg (ref 26.0–34.0)
MCHC: 31.2 g/dL (ref 30.0–36.0)
MCV: 93.1 fL (ref 80.0–100.0)
Platelets: 232 10*3/uL (ref 150–400)
RBC: 5.79 MIL/uL — ABNORMAL HIGH (ref 3.87–5.11)
RDW: 13.9 % (ref 11.5–15.5)
WBC: 6 10*3/uL (ref 4.0–10.5)
nRBC: 0 % (ref 0.0–0.2)

## 2023-08-12 LAB — HEMOGLOBIN A1C
Hgb A1c MFr Bld: 7 % — ABNORMAL HIGH (ref 4.8–5.6)
Mean Plasma Glucose: 154.2 mg/dL

## 2023-08-12 LAB — GLUCOSE, CAPILLARY: Glucose-Capillary: 102 mg/dL — ABNORMAL HIGH (ref 70–99)

## 2023-08-17 NOTE — Anesthesia Preprocedure Evaluation (Addendum)
Anesthesia Evaluation  Patient identified by MRN, date of birth, ID band Patient awake    Reviewed: Allergy & Precautions, NPO status , Patient's Chart, lab work & pertinent test results  History of Anesthesia Complications Negative for: history of anesthetic complications  Airway Mallampati: III  TM Distance: >3 FB Neck ROM: Full    Dental  (+) Teeth Intact, Dental Advisory Given   Pulmonary shortness of breath and Long-Term Oxygen Therapy, COPD, Current Smoker and Patient abstained from smoking.   breath sounds clear to auscultation       Cardiovascular hypertension, Pt. on medications (-) angina +CHF  (-) Past MI (-) dysrhythmias  Rhythm:Regular  Left ventricle: The cavity size was normal. Systolic function was    vigorous. The estimated ejection fraction was in the range of 65%    to 70%. There was dynamic obstruction at rest, with a peak    velocity of 136 cm/sec and a peak gradient of 7 mm Hg. Wall    motion was normal; there were no regional wall motion    abnormalities. Features are consistent with a pseudonormal left    ventricular filling pattern, with concomitant abnormal relaxation    and increased filling pressure (grade 2 diastolic dysfunction).  - Aortic valve: Transvalvular velocity was within the normal range.    There was no stenosis. There was no regurgitation.  - Mitral valve: Transvalvular velocity was within the normal range.    There was no evidence for stenosis. There was no regurgitation.  - Right ventricle: The cavity size was normal. Wall thickness was    normal. Systolic function was normal.  - Atrial septum: No defect or patent foramen ovale was identified    by color flow Doppler.  - Tricuspid valve: There was trivial regurgitation.  - Pulmonary arteries: Systolic pressure was within the normal    range. PA peak pressure: 34 mm Hg (S).     Neuro/Psych negative neurological ROS  negative psych  ROS   GI/Hepatic negative GI ROS, Neg liver ROS,,,  Endo/Other  diabetes  Morbid obesityLab Results      Component                Value               Date                      HGBA1C                   7.0 (H)             08/12/2023             Renal/GU CRFRenal diseaseLab Results      Component                Value               Date                      NA                       139                 08/12/2023                K  4.7                 08/12/2023                CO2                      21 (L)              08/12/2023                GLUCOSE                  90                  08/12/2023                BUN                      52 (H)              08/12/2023                CREATININE               1.53 (H)            08/12/2023                CALCIUM                  9.2                 08/12/2023                EGFR                     46 (L)              08/05/2022                GFRNONAA                 37 (L)              08/12/2023                Musculoskeletal   Abdominal   Peds  Hematology negative hematology ROS (+) Lab Results      Component                Value               Date                      WBC                      6.0                 08/12/2023                HGB                      16.8 (H)            08/12/2023                HCT                      53.9 (H)  08/12/2023                MCV                      93.1                08/12/2023                PLT                      232                 08/12/2023              Anesthesia Other Findings   Reproductive/Obstetrics                              Anesthesia Physical Anesthesia Plan  ASA: 3  Anesthesia Plan: General   Post-op Pain Management: Tylenol PO (pre-op)*   Induction: Intravenous  PONV Risk Score and Plan: 2 and Ondansetron and Dexamethasone  Airway Management Planned: LMA and Oral ETT  Additional Equipment:  None  Intra-op Plan:   Post-operative Plan: Extubation in OR  Informed Consent: I have reviewed the patients History and Physical, chart, labs and discussed the procedure including the risks, benefits and alternatives for the proposed anesthesia with the patient or authorized representative who has indicated his/her understanding and acceptance.     Dental advisory given  Plan Discussed with: CRNA  Anesthesia Plan Comments: (See PAT note from 9/20 by Sherlie Ban PA-C )         Anesthesia Quick Evaluation

## 2023-08-17 NOTE — Progress Notes (Signed)
Case: [id number] Date/Time: 08/19/23 0830   Procedure: EXCISION POSTERIOR NECK SEBACEOUS CYST   Anesthesia type: Monitor Anesthesia Care   Pre-op diagnosis: chronically infected posterior neck sebaceous cyst   Location: [facility] ROOM 01 / [facility]   Surgeons: Abigail Miyamoto, MD       DISCUSSION: Shari Obrien is a 67 yo female who presents to PAT prior to Excision Posterior Neck Sebaceous Cyst on 08/19/23 with Dr. Magnus Ivan. PMH of every day smoking, HTN, aortic atherosclerosis, diastolic CHF, COPD, chronic respiratory failure on 2L O2 prn, CKD.  Patient follows with PCP for management of chronic medical issues. Last seen on 06/23/23. She was started on Coreg for dCHF (although does not appear on her med list). BP noted to be mildly elevated at PAT visit. Kidney function is being monitored. Medical clearance received (paper copy in chart):   "Shari Obrien medical conditions are medically optimized. Due to her medical conditions, she is at higher than average risk for any morbidity or mortality associated with general anesthesia. However, the benefit of surgery outweighs the risk associated with surgery/anesthesia"   VS: BP (!) 155/87   Pulse 76   Temp 36.9 C (Oral)   Resp 16   Ht 5\' 3"  (1.6 m)   Wt 109.8 kg   SpO2 96%   BMI 42.87 kg/m   PROVIDERS: Paliwal, Himanshu, MD   LABS: Labs reviewed: Acceptable for surgery. (all labs ordered are listed, but only abnormal results are displayed)  Labs Reviewed  HEMOGLOBIN A1C - Abnormal; Notable for the following components:      Result Value   Hgb A1c MFr Bld 7.0 (*)    All other components within normal limits  BASIC METABOLIC PANEL - Abnormal; Notable for the following components:   CO2 21 (*)    BUN 52 (*)    Creatinine, Ser 1.53 (*)    GFR, Estimated 37 (*)    All other components within normal limits  CBC - Abnormal; Notable for the following components:   RBC 5.79 (*)    Hemoglobin 16.8 (*)    HCT 53.9 (*)    All other  components within normal limits  GLUCOSE, CAPILLARY - Abnormal; Notable for the following components:   Glucose-Capillary 102 (*)    All other components within normal limits     IMAGES:  CT Chest 06/03/2023:  IMPRESSION: 1. Lung-RADS 2S, benign appearance or behavior. Continue annual screening with low-dose chest CT without contrast in 12 months. S modifier for coronary artery calcifications. 2. Mild coronary artery calcifications, recommend ASCVD risk assessment. 3. Aortic Atherosclerosis (ICD10-I70.0) and Emphysema (ICD10-J43.9).   EKG 08/12/23  Normal sinus rhythm, rate 79 Low voltage QRS Nonspecific T wave abnormality   CV:  Echo 06/30/2016:  Study Conclusions   - Left ventricle: The cavity size was normal. Systolic function was    vigorous. The estimated ejection fraction was in the range of 65%    to 70%. There was dynamic obstruction at rest, with a peak    velocity of 136 cm/sec and a peak gradient of 7 mm Hg. Wall    motion was normal; there were no regional wall motion    abnormalities. Features are consistent with a pseudonormal left    ventricular filling pattern, with concomitant abnormal relaxation    and increased filling pressure (grade 2 diastolic dysfunction).  - Aortic valve: Transvalvular velocity was within the normal range.    There was no stenosis. There was no regurgitation.  - Mitral valve: Transvalvular  velocity was within the normal range.    There was no evidence for stenosis. There was no regurgitation.  - Right ventricle: The cavity size was normal. Wall thickness was    normal. Systolic function was normal.  - Atrial septum: No defect or patent foramen ovale was identified    by color flow Doppler.  - Tricuspid valve: There was trivial regurgitation.  - Pulmonary arteries: Systolic pressure was within the normal    range. PA peak pressure: 34 mm Hg (S).    Past Medical History:  Diagnosis Date   Aortic atherosclerosis (HCC)    CHF  (congestive heart failure) (HCC)    COPD (chronic obstructive pulmonary disease) (HCC) Dx 2013   Dyspnea    HTN (hypertension) 08/30/2013   Hypertension    Lower leg edema 06/2016   On home oxygen therapy    Plantar fasciitis     Past Surgical History:  Procedure Laterality Date   ABDOMINAL HYSTERECTOMY     BACK SURGERY      MEDICATIONS:  acetaminophen (TYLENOL) 650 MG CR tablet   albuterol (PROVENTIL HFA) 108 (90 Base) MCG/ACT inhaler   amLODipine (NORVASC) 10 MG tablet   Aspirin-Caffeine (BAYER BACK & BODY PAIN EX ST PO)   atorvastatin (LIPITOR) 20 MG tablet   Blood Glucose Monitoring Suppl (ACCU-CHEK GUIDE ME) w/Device KIT   cetirizine (ZYRTEC) 10 MG tablet   Colchicine 0.6 MG CAPS   diazepam (VALIUM) 5 MG tablet   DULoxetine (CYMBALTA) 60 MG capsule   Fluticasone-Umeclidin-Vilant (TRELEGY ELLIPTA) 100-62.5-25 MCG/ACT AEPB   furosemide (LASIX) 40 MG tablet   glucose blood (ACCU-CHEK GUIDE) test strip   hydrocortisone 2.5 % cream   ipratropium-albuterol (DUONEB) 0.5-2.5 (3) MG/3ML SOLN   lisinopril (ZESTRIL) 40 MG tablet   naproxen (NAPROSYN) 500 MG tablet   oxyCODONE-acetaminophen (PERCOCET) 5-325 MG tablet   OXYGEN   Semaglutide,0.25 or 0.5MG /DOS, (OZEMPIC, 0.25 OR 0.5 MG/DOSE,) 2 MG/3ML SOPN   spironolactone (ALDACTONE) 25 MG tablet   spironolactone (ALDACTONE) 50 MG tablet   tiZANidine (ZANAFLEX) 4 MG tablet   No current facility-administered medications for this encounter.   Shari Obrien [facility] Short Stay/Anesthesiology [facility] Phone [phone] 08/17/2023 1:59 PM

## 2023-08-18 NOTE — H&P (Signed)
REFERRING PHYSICIAN: Brennan Bailey, MD PROVIDER: Wayne Both, MD MRN: [id number] DOB: July 17, 1956 DATE OF ENCOUNTER: 07/27/2023 Subjective   Chief Complaint: New Consultation (- Sebaceous cyst on neck, )  History of Present Illness: Shari Obrien is a 67 y.o. female who is seen today as an office consultation for evaluation of New Consultation (- Sebaceous cyst on neck, )  This is a 67 year old female referred here for a painful cyst on the back of her neck. She reports that has been present for several years but is now getting much larger. It causes pain but has not had any drainage. He denies trauma to the area and has no previous history of cysts  Review of Systems: A complete review of systems was obtained from the patient. I have reviewed this information and discussed as appropriate with the patient. See HPI as well for other ROS.  ROS   Medical History: Past Medical History:  Diagnosis Date  COPD (chronic obstructive pulmonary disease) (CMS/HHS-HCC)  Hypertension   There is no problem list on file for this patient.  Past Surgical History:  Procedure Laterality Date  SPINE SURGERY    Allergies  Allergen Reactions  Cyproheptadine Swelling   Current Outpatient Medications on File Prior to Visit  Medication Sig Dispense Refill  albuterol MDI, PROVENTIL, VENTOLIN, PROAIR, HFA 90 mcg/actuation inhaler Inhale 2 Inhalations into the lungs every 4 (four) hours as needed  amLODIPine (NORVASC) 10 MG tablet Take 1 tablet by mouth once daily  FUROsemide (LASIX) 40 MG tablet Take 40 mg by mouth once daily  lisinopriL (ZESTRIL) 40 MG tablet Take by mouth   No current facility-administered medications on file prior to visit.   History reviewed. No pertinent family history.   Social History   Tobacco Use  Smoking Status Every Day  Current packs/day: 0.50  Types: Cigarettes  Smokeless Tobacco Never    Social History   Socioeconomic History  Marital status:  Widowed  Tobacco Use  Smoking status: Every Day  Current packs/day: 0.50  Types: Cigarettes  Smokeless tobacco: Never   Objective:   Vitals:  07/27/23 0945  BP: 124/79  Pulse: 94  Temp: 36.1 C (97 F)  Weight: (!) 108.4 kg (239 lb)  Height: 160 cm (5\' 3" )   Body mass index is 42.34 kg/m.  Physical Exam   She appears well on exam.  There is a 4 to 5 cm cyst on the back of her neck at the posterior midline at the base of the skull. It is tender with some mild induration but no erythema. It is slightly mobile.  Labs, Imaging and Diagnostic Testing:  Assessment and Plan:   Diagnoses and all orders for this visit:  Infected sebaceous cyst of skin   I suspect this is a chronically infected sebaceous cyst although I cannot rule out a desmoid tumor. Regardless, it is tender and very uncomfortable for the patient and getting larger. Surgical excision of this is recommended for complete histologic evaluation. I explained the surgical procedure in detail. We discussed the risks which includes but is not limited to bleeding, infection, cyst recurrence, cardiopulmonary issues, postoperative recovery, etc. She understands and agrees with the plans.

## 2023-08-19 ENCOUNTER — Other Ambulatory Visit: Payer: Self-pay

## 2023-08-19 ENCOUNTER — Ambulatory Visit (HOSPITAL_COMMUNITY)
Admission: RE | Admit: 2023-08-19 | Discharge: 2023-08-19 | Disposition: A | Discharge status: Home / Self Care | Payer: Medicare HMO | Attending: Surgery | Admitting: Surgery

## 2023-08-19 ENCOUNTER — Ambulatory Visit (HOSPITAL_COMMUNITY): Payer: Medicare HMO | Admitting: Physician Assistant

## 2023-08-19 ENCOUNTER — Ambulatory Visit (HOSPITAL_BASED_OUTPATIENT_CLINIC_OR_DEPARTMENT_OTHER): Payer: Medicare HMO | Admitting: Anesthesiology

## 2023-08-19 ENCOUNTER — Other Ambulatory Visit (HOSPITAL_COMMUNITY): Payer: Self-pay

## 2023-08-19 ENCOUNTER — Encounter (HOSPITAL_COMMUNITY): Payer: Self-pay | Admitting: Surgery

## 2023-08-19 ENCOUNTER — Encounter (HOSPITAL_COMMUNITY)
Admission: RE | Disposition: A | Discharge status: Home / Self Care | Payer: Self-pay | Source: Home / Self Care | Attending: Surgery

## 2023-08-19 DIAGNOSIS — Z6841 Body Mass Index (BMI) 40.0 and over, adult: Secondary | ICD-10-CM | POA: Insufficient documentation

## 2023-08-19 DIAGNOSIS — L723 Sebaceous cyst: Secondary | ICD-10-CM | POA: Diagnosis not present

## 2023-08-19 DIAGNOSIS — I110 Hypertensive heart disease with heart failure: Secondary | ICD-10-CM | POA: Diagnosis not present

## 2023-08-19 DIAGNOSIS — E119 Type 2 diabetes mellitus without complications: Secondary | ICD-10-CM | POA: Diagnosis not present

## 2023-08-19 DIAGNOSIS — F17210 Nicotine dependence, cigarettes, uncomplicated: Secondary | ICD-10-CM | POA: Insufficient documentation

## 2023-08-19 DIAGNOSIS — L729 Follicular cyst of the skin and subcutaneous tissue, unspecified: Secondary | ICD-10-CM | POA: Diagnosis not present

## 2023-08-19 DIAGNOSIS — Z7985 Long-term (current) use of injectable non-insulin antidiabetic drugs: Secondary | ICD-10-CM

## 2023-08-19 DIAGNOSIS — I5030 Unspecified diastolic (congestive) heart failure: Secondary | ICD-10-CM | POA: Diagnosis not present

## 2023-08-19 DIAGNOSIS — J449 Chronic obstructive pulmonary disease, unspecified: Secondary | ICD-10-CM | POA: Diagnosis not present

## 2023-08-19 DIAGNOSIS — L720 Epidermal cyst: Secondary | ICD-10-CM | POA: Diagnosis not present

## 2023-08-19 HISTORY — PX: CYST EXCISION: SHX5701

## 2023-08-19 LAB — GLUCOSE, CAPILLARY
Glucose-Capillary: 108 mg/dL — ABNORMAL HIGH (ref 70–99)
Glucose-Capillary: 108 mg/dL — ABNORMAL HIGH (ref 70–99)

## 2023-08-19 SURGERY — CYST REMOVAL
Anesthesia: General

## 2023-08-19 MED ORDER — FENTANYL CITRATE (PF) 100 MCG/2ML IJ SOLN
INTRAMUSCULAR | Status: AC
  Filled 2023-08-19: qty 2

## 2023-08-19 MED ORDER — ONDANSETRON HCL 4 MG/2ML IJ SOLN
INTRAMUSCULAR | Status: DC | PRN
Start: 2023-08-19 — End: 2023-08-19
  Administered 2023-08-19: 4 mg via INTRAVENOUS

## 2023-08-19 MED ORDER — LACTATED RINGERS IV SOLN: INTRAVENOUS | Status: DC

## 2023-08-19 MED ORDER — INSULIN ASPART 100 UNIT/ML IJ SOLN: 0.0000 [IU] | INTRAMUSCULAR | Status: DC | PRN

## 2023-08-19 MED ORDER — ACETAMINOPHEN 160 MG/5ML PO SOLN: 1000.0000 mg | Freq: Once | ORAL | Status: DC | PRN

## 2023-08-19 MED ORDER — 0.9 % SODIUM CHLORIDE (POUR BTL) OPTIME
TOPICAL | Status: DC | PRN
Start: 2023-08-19 — End: 2023-08-19
  Administered 2023-08-19: 1000 mL

## 2023-08-19 MED ORDER — LIDOCAINE 2% (20 MG/ML) 5 ML SYRINGE
INTRAMUSCULAR | Status: DC | PRN
  Administered 2023-08-19: 60 mg via INTRAVENOUS

## 2023-08-19 MED ORDER — BUPIVACAINE-EPINEPHRINE 0.25% -1:200000 IJ SOLN
INTRAMUSCULAR | Status: AC
  Filled 2023-08-19: qty 1

## 2023-08-19 MED ORDER — MIDAZOLAM HCL 2 MG/2ML IJ SOLN
INTRAMUSCULAR | Status: AC
  Filled 2023-08-19: qty 2

## 2023-08-19 MED ORDER — CHLORHEXIDINE GLUCONATE CLOTH 2 % EX PADS: 6.0000 | MEDICATED_PAD | Freq: Once | CUTANEOUS | Status: DC

## 2023-08-19 MED ORDER — OXYCODONE HCL 5 MG/5ML PO SOLN: 5.0000 mg | Freq: Once | ORAL | Status: DC | PRN

## 2023-08-19 MED ORDER — PHENYLEPHRINE 80 MCG/ML (10ML) SYRINGE FOR IV PUSH (FOR BLOOD PRESSURE SUPPORT)
PREFILLED_SYRINGE | INTRAVENOUS | Status: DC | PRN
Start: 2023-08-19 — End: 2023-08-19
  Administered 2023-08-19 (×2): 160 ug via INTRAVENOUS

## 2023-08-19 MED ORDER — OXYCODONE HCL 5 MG PO TABS: 5.0000 mg | ORAL_TABLET | Freq: Once | ORAL | Status: DC | PRN

## 2023-08-19 MED ORDER — ONDANSETRON HCL 4 MG/2ML IJ SOLN
INTRAMUSCULAR | Status: AC
  Filled 2023-08-19: qty 2

## 2023-08-19 MED ORDER — MIDAZOLAM HCL 5 MG/5ML IJ SOLN
INTRAMUSCULAR | Status: DC | PRN
  Administered 2023-08-19: 1 mg via INTRAVENOUS

## 2023-08-19 MED ORDER — ACETAMINOPHEN 10 MG/ML IV SOLN: 1000.0000 mg | Freq: Once | INTRAVENOUS | Status: DC | PRN

## 2023-08-19 MED ORDER — DEXAMETHASONE SODIUM PHOSPHATE 10 MG/ML IJ SOLN
INTRAMUSCULAR | Status: DC | PRN
Start: 2023-08-19 — End: 2023-08-19
  Administered 2023-08-19: 10 mg via INTRAVENOUS

## 2023-08-19 MED ORDER — FENTANYL CITRATE PF 50 MCG/ML IJ SOSY: 25.0000 ug | PREFILLED_SYRINGE | INTRAMUSCULAR | Status: DC | PRN

## 2023-08-19 MED ORDER — PROPOFOL 1000 MG/100ML IV EMUL
INTRAVENOUS | Status: AC
  Filled 2023-08-19: qty 100

## 2023-08-19 MED ORDER — BUPIVACAINE-EPINEPHRINE 0.25% -1:200000 IJ SOLN
INTRAMUSCULAR | Status: DC | PRN
  Administered 2023-08-19: 10 mL

## 2023-08-19 MED ORDER — ACETAMINOPHEN 500 MG PO TABS
1000.0000 mg | ORAL_TABLET | ORAL | Status: AC
  Administered 2023-08-19: 1000 mg via ORAL
  Filled 2023-08-19: qty 2

## 2023-08-19 MED ORDER — CHLORHEXIDINE GLUCONATE 0.12 % MT SOLN
15.0000 mL | Freq: Once | OROMUCOSAL | Status: AC
  Administered 2023-08-19: 15 mL via OROMUCOSAL

## 2023-08-19 MED ORDER — LIDOCAINE HCL (PF) 2 % IJ SOLN
INTRAMUSCULAR | Status: AC
  Filled 2023-08-19: qty 5

## 2023-08-19 MED ORDER — TRAMADOL HCL 50 MG PO TABS
50.0000 mg | ORAL_TABLET | Freq: Four times a day (QID) | ORAL | 0 refills | Status: DC | PRN
  Filled 2023-08-19 (×2): qty 15, 4d supply, fill #0

## 2023-08-19 MED ORDER — PROPOFOL 10 MG/ML IV BOLUS
INTRAVENOUS | Status: AC
  Filled 2023-08-19: qty 20

## 2023-08-19 MED ORDER — DEXAMETHASONE SODIUM PHOSPHATE 10 MG/ML IJ SOLN
INTRAMUSCULAR | Status: AC
  Filled 2023-08-19: qty 1

## 2023-08-19 MED ORDER — ACETAMINOPHEN 500 MG PO TABS: 1000.0000 mg | ORAL_TABLET | Freq: Once | ORAL | Status: DC | PRN

## 2023-08-19 MED ORDER — CEFAZOLIN IN SODIUM CHLORIDE 3-0.9 GM/100ML-% IV SOLN
3.0000 g | INTRAVENOUS | Status: AC
  Administered 2023-08-19: 3 g via INTRAVENOUS
  Filled 2023-08-19: qty 100

## 2023-08-19 MED ORDER — PROPOFOL 10 MG/ML IV BOLUS
INTRAVENOUS | Status: DC | PRN
  Administered 2023-08-19: 130 mg via INTRAVENOUS

## 2023-08-19 MED ORDER — ORAL CARE MOUTH RINSE: 15.0000 mL | Freq: Once | OROMUCOSAL | Status: AC

## 2023-08-19 SURGICAL SUPPLY — 30 items
ADH SKN CLS APL DERMABOND .7 (GAUZE/BANDAGES/DRESSINGS) ×1
APL PRP STRL LF DISP 70% ISPRP (MISCELLANEOUS) ×1
APL SKNCLS STERI-STRIP NONHPOA (GAUZE/BANDAGES/DRESSINGS) ×1
BAG COUNTER SPONGE SURGICOUNT (BAG) IMPLANT
BAG SPNG CNTER NS LX DISP (BAG)
BENZOIN TINCTURE PRP APPL 2/3 (GAUZE/BANDAGES/DRESSINGS) IMPLANT
BLADE SURG 15 STRL LF DISP TIS (BLADE) ×2 IMPLANT
BLADE SURG 15 STRL SS (BLADE) ×1
CHLORAPREP W/TINT 26 (MISCELLANEOUS) ×2 IMPLANT
DERMABOND ADVANCED .7 DNX12 (GAUZE/BANDAGES/DRESSINGS) IMPLANT
DRAPE LAPAROSCOPIC ABDOMINAL (DRAPES) IMPLANT
DRAPE LAPAROTOMY T 98X78 PEDS (DRAPES) IMPLANT
ELECT REM PT RETURN 15FT ADLT (MISCELLANEOUS) ×2 IMPLANT
GAUZE SPONGE 4X4 12PLY STRL (GAUZE/BANDAGES/DRESSINGS) IMPLANT
GLOVE BIO SURGEON STRL SZ7.5 (GLOVE) ×2 IMPLANT
GOWN STRL REUS W/ TWL XL LVL3 (GOWN DISPOSABLE) ×4 IMPLANT
GOWN STRL REUS W/TWL XL LVL3 (GOWN DISPOSABLE) ×2
KIT BASIN OR (CUSTOM PROCEDURE TRAY) ×2 IMPLANT
KIT TURNOVER KIT A (KITS) IMPLANT
NDL HYPO 25X1 1.5 SAFETY (NEEDLE) ×2 IMPLANT
NEEDLE HYPO 25X1 1.5 SAFETY (NEEDLE) ×1
PACK BASIC VI WITH GOWN DISP (CUSTOM PROCEDURE TRAY) ×2 IMPLANT
PENCIL SMOKE EVACUATOR (MISCELLANEOUS) IMPLANT
SPIKE FLUID TRANSFER (MISCELLANEOUS) ×2 IMPLANT
STRIP CLOSURE SKIN 1/2X4 (GAUZE/BANDAGES/DRESSINGS) IMPLANT
SUT MNCRL AB 4-0 PS2 18 (SUTURE) ×2 IMPLANT
SUT VIC AB 3-0 SH 27 (SUTURE) ×1
SUT VIC AB 3-0 SH 27XBRD (SUTURE) ×2 IMPLANT
SYR CONTROL 10ML LL (SYRINGE) ×2 IMPLANT
TOWEL OR 17X26 10 PK STRL BLUE (TOWEL DISPOSABLE) ×2 IMPLANT

## 2023-08-19 NOTE — Transfer of Care (Signed)
Immediate Anesthesia Transfer of Care Note  Patient: Shari Obrien  Procedure(s) Performed: EXCISION POSTERIOR NECK SEBACEOUS CYST  Patient Location: PACU  Anesthesia Type:General  Level of Consciousness: drowsy  Airway & Oxygen Therapy: Patient Spontanous Breathing and Patient connected to face mask oxygen  Post-op Assessment: Report given to RN and Post -op Vital signs reviewed and stable  Post vital signs: Reviewed and stable  Last Vitals:  Vitals Value Taken Time  BP    Temp    Pulse 85 08/19/23 0816  Resp 17 08/19/23 0816  SpO2 97 % 08/19/23 0816  Vitals shown include unfiled device data.  Last Pain:  Vitals:   08/19/23 0653  TempSrc: Oral  PainSc: 0-No pain      Patients Stated Pain Goal: 4 (08/19/23 1610)  Complications: No notable events documented.

## 2023-08-19 NOTE — Op Note (Signed)
EXCISION POSTERIOR NECK SEBACEOUS CYST  Procedure Note  Shari Obrien 08/19/2023   Pre-op Diagnosis: chronically infected posterior neck sebaceous cyst (5 cm )     Post-op Diagnosis: same  Procedure(s): EXCISION POSTERIOR NECK SEBACEOUS CYST (5 cm)  Surgeon(s): Abigail Miyamoto, MD  Anesthesia: Monitor Anesthesia Care  Staff:  Circulator: Renaye Rakers, RN Scrub Person: Georgette Shell A  Estimated Blood Loss: Minimal               Specimens: sent to path  Procedure: Patient was brought to the operating room and identified as the correct patient.  She was placed supine on the operating room table and general anesthesia was induced.  She was then placed into the right lateral decubitus position.  She had a large 5 cm mass at the posterior midline of her neck.  Her neck was prepped and draped in usual sterile fashion.  I anesthetized skin with Marcaine and made a small incision with a scalpel.  In order to remove the sebaceous cyst I had to remove all the sebaceous debris and then the capsule easily came out of the incision.  The capsule was sent to pathology for evaluation.  I explored the wound and saw no other sebaceous debris or capsule.  I then closed the incision with interrupted 3-0 Vicryl sutures and closed the skin with 4-0 Monocryl sutures.  Dermabond was then applied.  The patient tolerated the procedure well.  All the counts were correct at the end of the procedure.  She was then extubated in the operating room and taken in stable condition to the recovery room.  Abigail Miyamoto   Date: 08/19/2023  Time: 8:10 AM

## 2023-08-19 NOTE — Anesthesia Procedure Notes (Signed)
Procedure Name: LMA Insertion Date/Time: 08/19/2023 7:41 AM  Performed by: Florene Route, CRNAPatient Re-evaluated:Patient Re-evaluated prior to induction Oxygen Delivery Method: Circle system utilized Preoxygenation: Pre-oxygenation with 100% oxygen Induction Type: IV induction LMA: LMA inserted LMA Size: 5.0 Number of attempts: 1 Placement Confirmation: positive ETCO2 and breath sounds checked- equal and bilateral Tube secured with: Tape Dental Injury: Teeth and Oropharynx as per pre-operative assessment

## 2023-08-19 NOTE — Discharge Instructions (Signed)
You may shower starting tomorrow  Ice pack, tylenol, and ibuprofen also for pain  No vigorous activity for one week

## 2023-08-19 NOTE — Interval H&P Note (Signed)
History and Physical Interval Note: no change in H and P  08/19/2023 6:52 AM  Shari Obrien  has presented today for surgery, with the diagnosis of chronically infected posterior neck sebaceous cyst.  The various methods of treatment have been discussed with the patient and family. After consideration of risks, benefits and other options for treatment, the patient has consented to  Procedure(s): EXCISION POSTERIOR NECK SEBACEOUS CYST (N/A) as a surgical intervention.  The patient's history has been reviewed, patient examined, no change in status, stable for surgery.  I have reviewed the patient's chart and labs.  Questions were answered to the patient's satisfaction.     Abigail Miyamoto

## 2023-08-20 ENCOUNTER — Encounter (HOSPITAL_COMMUNITY): Payer: Self-pay | Admitting: Surgery

## 2023-08-20 NOTE — Anesthesia Postprocedure Evaluation (Signed)
Anesthesia Post Note  Patient: Shari Obrien  Procedure(s) Performed: EXCISION POSTERIOR NECK SEBACEOUS CYST     Patient location during evaluation: PACU Anesthesia Type: General Level of consciousness: awake and alert Pain management: pain level controlled Vital Signs Assessment: post-procedure vital signs reviewed and stable Respiratory status: spontaneous breathing, nonlabored ventilation, respiratory function stable and patient connected to nasal cannula oxygen Cardiovascular status: blood pressure returned to baseline and stable Postop Assessment: no apparent nausea or vomiting Anesthetic complications: no   No notable events documented.  Last Vitals:  Vitals:   08/19/23 0900 08/19/23 0907  BP: 108/82 123/81  Pulse: 70 82  Resp: 15   Temp:  36.4 C  SpO2: 95% 91%    Last Pain:  Vitals:   08/19/23 0907  TempSrc:   PainSc: 0-No pain                 [name]

## 2023-08-22 ENCOUNTER — Other Ambulatory Visit (HOSPITAL_COMMUNITY): Payer: Self-pay

## 2023-08-22 LAB — SURGICAL PATHOLOGY

## 2023-09-12 ENCOUNTER — Other Ambulatory Visit: Payer: Self-pay | Admitting: Family Medicine

## 2023-09-12 DIAGNOSIS — Z1231 Encounter for screening mammogram for malignant neoplasm of breast: Secondary | ICD-10-CM

## 2023-12-26 ENCOUNTER — Inpatient Hospital Stay: Admission: RE | Admit: 2023-12-26 | Payer: Medicare HMO | Source: Ambulatory Visit

## 2024-03-09 ENCOUNTER — Encounter (HOSPITAL_COMMUNITY): Payer: Self-pay | Admitting: *Deleted

## 2024-03-09 ENCOUNTER — Emergency Department (HOSPITAL_COMMUNITY)

## 2024-03-09 ENCOUNTER — Inpatient Hospital Stay (HOSPITAL_COMMUNITY)
Admission: EM | Admit: 2024-03-09 | Discharge: 2024-03-16 | DRG: 683 | Disposition: A | Discharge status: Skilled Nursing Facility | Attending: Internal Medicine | Admitting: Internal Medicine

## 2024-03-09 ENCOUNTER — Other Ambulatory Visit: Payer: Self-pay

## 2024-03-09 DIAGNOSIS — E860 Dehydration: Secondary | ICD-10-CM | POA: Diagnosis present

## 2024-03-09 DIAGNOSIS — E1122 Type 2 diabetes mellitus with diabetic chronic kidney disease: Secondary | ICD-10-CM | POA: Diagnosis present

## 2024-03-09 DIAGNOSIS — M4802 Spinal stenosis, cervical region: Secondary | ICD-10-CM | POA: Diagnosis present

## 2024-03-09 DIAGNOSIS — F17210 Nicotine dependence, cigarettes, uncomplicated: Secondary | ICD-10-CM | POA: Diagnosis present

## 2024-03-09 DIAGNOSIS — Z7951 Long term (current) use of inhaled steroids: Secondary | ICD-10-CM | POA: Diagnosis not present

## 2024-03-09 DIAGNOSIS — E875 Hyperkalemia: Secondary | ICD-10-CM | POA: Diagnosis present

## 2024-03-09 DIAGNOSIS — K5900 Constipation, unspecified: Secondary | ICD-10-CM | POA: Diagnosis present

## 2024-03-09 DIAGNOSIS — D751 Secondary polycythemia: Secondary | ICD-10-CM | POA: Diagnosis present

## 2024-03-09 DIAGNOSIS — R8290 Unspecified abnormal findings in urine: Secondary | ICD-10-CM | POA: Diagnosis present

## 2024-03-09 DIAGNOSIS — N200 Calculus of kidney: Secondary | ICD-10-CM | POA: Diagnosis present

## 2024-03-09 DIAGNOSIS — J449 Chronic obstructive pulmonary disease, unspecified: Secondary | ICD-10-CM | POA: Diagnosis present

## 2024-03-09 DIAGNOSIS — E871 Hypo-osmolality and hyponatremia: Secondary | ICD-10-CM | POA: Diagnosis present

## 2024-03-09 DIAGNOSIS — M48061 Spinal stenosis, lumbar region without neurogenic claudication: Principal | ICD-10-CM

## 2024-03-09 DIAGNOSIS — I10 Essential (primary) hypertension: Secondary | ICD-10-CM | POA: Diagnosis not present

## 2024-03-09 DIAGNOSIS — N179 Acute kidney failure, unspecified: Secondary | ICD-10-CM | POA: Diagnosis present

## 2024-03-09 DIAGNOSIS — Z888 Allergy status to other drugs, medicaments and biological substances status: Secondary | ICD-10-CM | POA: Diagnosis not present

## 2024-03-09 DIAGNOSIS — N183 Chronic kidney disease, stage 3 unspecified: Secondary | ICD-10-CM

## 2024-03-09 DIAGNOSIS — E8721 Acute metabolic acidosis: Secondary | ICD-10-CM | POA: Diagnosis present

## 2024-03-09 DIAGNOSIS — I5032 Chronic diastolic (congestive) heart failure: Secondary | ICD-10-CM | POA: Diagnosis present

## 2024-03-09 DIAGNOSIS — I5030 Unspecified diastolic (congestive) heart failure: Secondary | ICD-10-CM | POA: Diagnosis present

## 2024-03-09 DIAGNOSIS — N1832 Chronic kidney disease, stage 3b: Secondary | ICD-10-CM | POA: Diagnosis present

## 2024-03-09 DIAGNOSIS — R109 Unspecified abdominal pain: Secondary | ICD-10-CM

## 2024-03-09 DIAGNOSIS — Z8249 Family history of ischemic heart disease and other diseases of the circulatory system: Secondary | ICD-10-CM

## 2024-03-09 DIAGNOSIS — F17200 Nicotine dependence, unspecified, uncomplicated: Secondary | ICD-10-CM | POA: Diagnosis not present

## 2024-03-09 DIAGNOSIS — M549 Dorsalgia, unspecified: Secondary | ICD-10-CM | POA: Diagnosis present

## 2024-03-09 DIAGNOSIS — Z79899 Other long term (current) drug therapy: Secondary | ICD-10-CM | POA: Diagnosis not present

## 2024-03-09 DIAGNOSIS — Z6841 Body Mass Index (BMI) 40.0 and over, adult: Secondary | ICD-10-CM

## 2024-03-09 DIAGNOSIS — M546 Pain in thoracic spine: Secondary | ICD-10-CM | POA: Diagnosis not present

## 2024-03-09 DIAGNOSIS — M4804 Spinal stenosis, thoracic region: Secondary | ICD-10-CM | POA: Diagnosis present

## 2024-03-09 DIAGNOSIS — I130 Hypertensive heart and chronic kidney disease with heart failure and stage 1 through stage 4 chronic kidney disease, or unspecified chronic kidney disease: Secondary | ICD-10-CM | POA: Diagnosis present

## 2024-03-09 DIAGNOSIS — E785 Hyperlipidemia, unspecified: Secondary | ICD-10-CM | POA: Diagnosis present

## 2024-03-09 LAB — CBC WITH DIFFERENTIAL/PLATELET
Abs Immature Granulocytes: 0.05 10*3/uL (ref 0.00–0.07)
Basophils Absolute: 0 10*3/uL (ref 0.0–0.1)
Basophils Relative: 0 %
Eosinophils Absolute: 0 10*3/uL (ref 0.0–0.5)
Eosinophils Relative: 0 %
HCT: 49.7 % — ABNORMAL HIGH (ref 36.0–46.0)
Hemoglobin: 16.2 g/dL — ABNORMAL HIGH (ref 12.0–15.0)
Immature Granulocytes: 1 %
Lymphocytes Relative: 22 %
Lymphs Abs: 2 10*3/uL (ref 0.7–4.0)
MCH: 29.1 pg (ref 26.0–34.0)
MCHC: 32.6 g/dL (ref 30.0–36.0)
MCV: 89.4 fL (ref 80.0–100.0)
Monocytes Absolute: 0.8 10*3/uL (ref 0.1–1.0)
Monocytes Relative: 9 %
Neutro Abs: 6.2 10*3/uL (ref 1.7–7.7)
Neutrophils Relative %: 68 %
Platelets: 256 10*3/uL (ref 150–400)
RBC: 5.56 MIL/uL — ABNORMAL HIGH (ref 3.87–5.11)
RDW: 13.9 % (ref 11.5–15.5)
WBC: 9 10*3/uL (ref 4.0–10.5)
nRBC: 0 % (ref 0.0–0.2)

## 2024-03-09 LAB — COMPREHENSIVE METABOLIC PANEL WITH GFR
ALT: 14 U/L (ref 0–44)
AST: 20 U/L (ref 15–41)
Albumin: 3.6 g/dL (ref 3.5–5.0)
Alkaline Phosphatase: 64 U/L (ref 38–126)
Anion gap: 14 (ref 5–15)
BUN: 102 mg/dL — ABNORMAL HIGH (ref 8–23)
CO2: 19 mmol/L — ABNORMAL LOW (ref 22–32)
Calcium: 8.7 mg/dL — ABNORMAL LOW (ref 8.9–10.3)
Chloride: 99 mmol/L (ref 98–111)
Creatinine, Ser: 3.19 mg/dL — ABNORMAL HIGH (ref 0.44–1.00)
GFR, Estimated: 15 mL/min — ABNORMAL LOW (ref >60)
Glucose, Bld: 96 mg/dL (ref 70–99)
Potassium: 4.7 mmol/L (ref 3.5–5.1)
Sodium: 132 mmol/L — ABNORMAL LOW (ref 135–145)
Total Bilirubin: 0.6 mg/dL (ref 0.0–1.2)
Total Protein: 7.4 g/dL (ref 6.5–8.1)

## 2024-03-09 LAB — LIPASE, BLOOD: Lipase: 44 U/L (ref 11–51)

## 2024-03-09 LAB — CREATININE, URINE, RANDOM: Creatinine, Urine: 100 mg/dL

## 2024-03-09 LAB — ACETAMINOPHEN LEVEL: Acetaminophen (Tylenol), Serum: <10 ug/mL — ABNORMAL LOW (ref 10–30)

## 2024-03-09 LAB — URINALYSIS, W/ REFLEX TO CULTURE (INFECTION SUSPECTED)
Bilirubin Urine: NEGATIVE
Glucose, UA: NEGATIVE mg/dL
Hgb urine dipstick: NEGATIVE
Ketones, ur: NEGATIVE mg/dL
Leukocytes,Ua: NEGATIVE
Nitrite: NEGATIVE
Protein, ur: NEGATIVE mg/dL
Specific Gravity, Urine: 1.009 (ref 1.005–1.030)
pH: 5 (ref 5.0–8.0)

## 2024-03-09 LAB — SODIUM, URINE, RANDOM: Sodium, Ur: 52 mmol/L

## 2024-03-09 LAB — SEDIMENTATION RATE: Sed Rate: 31 mm/h — ABNORMAL HIGH (ref 0–22)

## 2024-03-09 LAB — TROPONIN I (HIGH SENSITIVITY): Troponin I (High Sensitivity): 14 ng/L (ref <18)

## 2024-03-09 LAB — SALICYLATE LEVEL: Salicylate Lvl: <7 mg/dL — ABNORMAL LOW (ref 7.0–30.0)

## 2024-03-09 MED ORDER — SODIUM CHLORIDE 0.9 % IV SOLN: INTRAVENOUS | Status: DC

## 2024-03-09 MED ORDER — ATORVASTATIN CALCIUM 10 MG PO TABS
20.0000 mg | ORAL_TABLET | Freq: Every day | ORAL | Status: DC
  Administered 2024-03-09 – 2024-03-16 (×8): 20 mg via ORAL
  Filled 2024-03-09 (×2): qty 2
  Filled 2024-03-09: qty 1
  Filled 2024-03-09 (×4): qty 2
  Filled 2024-03-09 (×2): qty 1
  Filled 2024-03-09: qty 2
  Filled 2024-03-09: qty 1

## 2024-03-09 MED ORDER — ACETAMINOPHEN 650 MG RE SUPP: 650.0000 mg | Freq: Four times a day (QID) | RECTAL | Status: DC | PRN

## 2024-03-09 MED ORDER — CALCIUM GLUCONATE-NACL 1-0.675 GM/50ML-% IV SOLN
1.0000 g | Freq: Once | INTRAVENOUS | Status: AC
  Administered 2024-03-09: 1000 mg via INTRAVENOUS
  Filled 2024-03-09: qty 50

## 2024-03-09 MED ORDER — ALBUTEROL SULFATE (2.5 MG/3ML) 0.083% IN NEBU: 2.5000 mg | INHALATION_SOLUTION | Freq: Four times a day (QID) | RESPIRATORY_TRACT | Status: DC | PRN

## 2024-03-09 MED ORDER — HYDRALAZINE HCL 20 MG/ML IJ SOLN: 10.0000 mg | INTRAMUSCULAR | Status: DC | PRN

## 2024-03-09 MED ORDER — OXYCODONE HCL 5 MG PO TABS
5.0000 mg | ORAL_TABLET | ORAL | Status: DC | PRN
  Administered 2024-03-09 – 2024-03-12 (×5): 5 mg via ORAL
  Filled 2024-03-09 (×5): qty 1

## 2024-03-09 MED ORDER — NICOTINE 21 MG/24HR TD PT24
21.0000 mg | MEDICATED_PATCH | Freq: Every day | TRANSDERMAL | Status: DC | PRN
  Filled 2024-03-09: qty 1

## 2024-03-09 MED ORDER — ACETAMINOPHEN 500 MG PO TABS
1000.0000 mg | ORAL_TABLET | Freq: Once | ORAL | Status: AC
Start: 2024-03-09 — End: 2024-03-09
  Administered 2024-03-09: 1000 mg via ORAL
  Filled 2024-03-09: qty 2

## 2024-03-09 MED ORDER — AMLODIPINE BESYLATE 10 MG PO TABS
10.0000 mg | ORAL_TABLET | Freq: Every day | ORAL | Status: DC
  Administered 2024-03-09 – 2024-03-16 (×8): 10 mg via ORAL
  Filled 2024-03-09 (×2): qty 2
  Filled 2024-03-09: qty 1
  Filled 2024-03-09: qty 2
  Filled 2024-03-09: qty 1
  Filled 2024-03-09: qty 2
  Filled 2024-03-09 (×2): qty 1

## 2024-03-09 MED ORDER — HYDROMORPHONE HCL 1 MG/ML IJ SOLN
1.0000 mg | INTRAMUSCULAR | Status: DC | PRN
  Administered 2024-03-09 – 2024-03-11 (×7): 1 mg via INTRAVENOUS
  Filled 2024-03-09 (×8): qty 1

## 2024-03-09 MED ORDER — SODIUM CHLORIDE 0.9 % IV BOLUS
500.0000 mL | Freq: Once | INTRAVENOUS | Status: AC
  Administered 2024-03-09: 500 mL via INTRAVENOUS

## 2024-03-09 MED ORDER — FENTANYL CITRATE PF 50 MCG/ML IJ SOSY
25.0000 ug | PREFILLED_SYRINGE | INTRAMUSCULAR | Status: DC | PRN
  Administered 2024-03-09: 25 ug via INTRAVENOUS
  Filled 2024-03-09: qty 1

## 2024-03-09 MED ORDER — ACETAMINOPHEN 325 MG PO TABS
650.0000 mg | ORAL_TABLET | Freq: Four times a day (QID) | ORAL | Status: DC | PRN
  Administered 2024-03-13 – 2024-03-16 (×2): 650 mg via ORAL
  Filled 2024-03-09 (×2): qty 2

## 2024-03-09 MED ORDER — LIDOCAINE 5 % EX PTCH
1.0000 | MEDICATED_PATCH | CUTANEOUS | Status: DC
  Administered 2024-03-09 – 2024-03-10 (×2): 1 via TRANSDERMAL
  Filled 2024-03-09 (×2): qty 1

## 2024-03-09 MED ORDER — HYDROCODONE-ACETAMINOPHEN 5-325 MG PO TABS
1.0000 | ORAL_TABLET | ORAL | Status: DC | PRN
  Administered 2024-03-09: 1 via ORAL
  Filled 2024-03-09: qty 1

## 2024-03-09 MED ORDER — STERILE WATER FOR INJECTION IV SOLN
INTRAVENOUS | Status: AC
  Filled 2024-03-09: qty 150

## 2024-03-09 MED ORDER — FENTANYL CITRATE PF 50 MCG/ML IJ SOSY
50.0000 ug | PREFILLED_SYRINGE | Freq: Once | INTRAMUSCULAR | Status: AC
  Administered 2024-03-09: 50 ug via INTRAVENOUS
  Filled 2024-03-09: qty 1

## 2024-03-09 MED ORDER — HEPARIN SODIUM (PORCINE) 5000 UNIT/ML IJ SOLN
5000.0000 [IU] | Freq: Three times a day (TID) | INTRAMUSCULAR | Status: DC
  Administered 2024-03-09 – 2024-03-16 (×19): 5000 [IU] via SUBCUTANEOUS
  Filled 2024-03-09 (×19): qty 1

## 2024-03-09 MED ORDER — SODIUM CHLORIDE 0.9% FLUSH
3.0000 mL | Freq: Two times a day (BID) | INTRAVENOUS | Status: DC
  Administered 2024-03-09 – 2024-03-16 (×14): 3 mL via INTRAVENOUS

## 2024-03-09 NOTE — ED Triage Notes (Signed)
 Pain in upper left quad of body. Asked again and same answer. "Feels like some bogy is stabbing me in my back" "I get nauseated some times"

## 2024-03-09 NOTE — ED Provider Notes (Signed)
 I provided a substantive portion of the care of this patient.  I personally made/approved the management plan for this patient and take responsibility for the patient management.      68 year old female presents with back pain.  Is been using copious amounts of salicylates for this.  Has evidence of AKI at this time.  Anion gap is slightly elevated at 15.  Waiting for salicylate level but in the meantime we will aggressively IV hydrate and admit to the hospital service   Dasie Faden, MD 03/09/24 1350

## 2024-03-09 NOTE — ED Provider Notes (Signed)
 [place] EMERGENCY DEPARTMENT AT [facility] Provider Note   CSN: [id number] Arrival date & time: 03/09/24  9093     History  Chief Complaint  Patient presents with   Pain    Shari Obrien is a 68 y.o. female with PMHx HTN, aortic atherosclerosis, CHF, COPD on 2.5L [place] at baseline, who presents to ED concerned for left thoracic back pain x2 weeks. Patient does not remember a specific trauma that would have lead to this pain. Patient has been taking Bayer Powder for the pain - last dose earlier this morning. Bayer Powder does help pain as long as patient stays completely still. Pain is worse with movement. Patient also stating that she has been having difficulties with constipation recently and wondering if these complaints are related. Last BM yesterday. Patient also admitting to triage nurse that they sometimes gets nauseated. Patient is a poor historian but will eventually answer questions that are repeated.  Denies fever, SOB, vomiting.  HPI     Home Medications Prior to Admission medications   Medication Sig Start Date End Date Taking? Authorizing Provider  acetaminophen  (TYLENOL ) 650 MG CR tablet Take 650 mg by mouth every 8 (eight) hours as needed for pain. 06/24/23   [provider]  albuterol  (PROVENTIL  HFA) 108 (90 Base) MCG/ACT inhaler Inhale 2 puffs into the lungs every 4 (four) hours as needed for wheezing or shortness of breath. 08/09/22   Newlin, Enobong, MD  amLODipine  (NORVASC ) 10 MG tablet Take 1 tablet (10 mg total) by mouth daily. 08/05/22   Newlin, Enobong, MD  Aspirin-Caffeine (BAYER BACK & BODY PAIN EX ST PO) Take 2 tablets by mouth 2 (two) times daily as needed (pain).    [provider]  atorvastatin  (LIPITOR) 20 MG tablet Take 1 tablet (20 mg total) by mouth daily. Patient not taking: Reported on 08/01/2023 08/05/22   Newlin, Enobong, MD  Blood Glucose Monitoring Suppl (ACCU-CHEK GUIDE ME) w/Device KIT Use as directed daily before  breakfast. 06/24/21   Newlin, Enobong, MD  cetirizine  (ZYRTEC ) 10 MG tablet Take 10 mg by mouth daily.    [provider]  Colchicine  0.6 MG CAPS Take 2 capsules by mouth on day 1, then 1 capsule daily for the next 5 days. Patient not taking: Reported on 08/01/2023 03/27/21   Newlin, Enobong, MD  diazepam  (VALIUM ) 5 MG tablet Take one tablet by mouth with food one hour prior to procedure. May repeat 30 minutes prior if needed. Patient not taking: Reported on 08/01/2023 07/22/21   Williams, Megan E, NP  DULoxetine  (CYMBALTA ) 60 MG capsule Take 1 capsule (60 mg total) by mouth daily. For back pain Patient not taking: Reported on 08/01/2023 08/05/22   Newlin, Enobong, MD  Fluticasone -Umeclidin-Vilant (TRELEGY ELLIPTA ) 100-62.5-25 MCG/ACT AEPB Inhale 1 puff into the lungs daily. Must have office visit for refills Patient taking differently: Inhale 1 puff into the lungs daily as needed (shortness of breath). Must have office visit for refills 01/19/23   Newlin, Enobong, MD  furosemide  (LASIX ) 40 MG tablet Take 1 tablet (40 mg total) by mouth daily. 08/27/22   Newlin, Enobong, MD  glucose blood (ACCU-CHEK GUIDE) test strip Use as instructed daily 06/24/21   Newlin, Enobong, MD  hydrocortisone  2.5 % cream Apply topically 2 (two) times daily. Patient taking differently: Apply 1 Application topically daily as needed (itching). 05/29/19   Newlin, Enobong, MD  ipratropium-albuterol  (DUONEB) 0.5-2.5 (3) MG/3ML SOLN Take 3 mLs by nebulization every 6 (six) hours as needed.  08/27/22   Newlin, Enobong, MD  lisinopril  (ZESTRIL ) 40 MG tablet Take 1 tablet (40 mg total) by mouth daily to lower blood pressure. 08/27/22   Newlin, Enobong, MD  naproxen  (NAPROSYN ) 500 MG tablet Take 500 mg by mouth 2 (two) times daily as needed for moderate pain. 06/27/23   [provider]  oxyCODONE -acetaminophen  (PERCOCET) 5-325 MG tablet Take 1 tablet by mouth every 4 (four) hours as needed for moderate pain or severe pain. Patient not  taking: Reported on 08/01/2023 05/13/21   Raford Lenis, MD  OXYGEN  Inhale 2 L into the lungs as needed (shortness of breath).    [provider]  Semaglutide ,0.25 or 0.5MG /DOS, (OZEMPIC , 0.25 OR 0.5 MG/DOSE,) 2 MG/3ML SOPN INJECT 0.25MG  UNDER THE SKIN ONE TIME WEEKLY (DISCONTINUE METFORMIN ) Patient not taking: Reported on 08/01/2023 01/24/23   Newlin, Enobong, MD  spironolactone  (ALDACTONE ) 25 MG tablet Take 1 tablet (25 mg total) by mouth daily. Please schedule PCP appointment with Dr. Newlin for more refills. 04/08/23   Newlin, Enobong, MD  spironolactone  (ALDACTONE ) 50 MG tablet Take 50 mg by mouth daily.    [provider]  tiZANidine  (ZANAFLEX ) 4 MG tablet Take 1 tablet (4 mg total) by mouth every 8 (eight) hours as needed for muscle spasms. 08/27/22   Newlin, Enobong, MD  traMADol  (ULTRAM ) 50 MG tablet Take 1 tablet (50 mg total) by mouth every 6 (six) hours as needed for moderate pain or severe pain. 08/19/23   Vernetta Berg, MD  loratadine  (ALLERGY RELIEF) 10 MG tablet Take 1 tablet (10 mg total) by mouth daily. For nasal congestion/allergy symptoms Patient not taking: Reported on 04/04/2019 03/21/19 05/24/19  Alec House, MD  metoprolol  tartrate (LOPRESSOR ) 50 MG tablet Take 1 tablet (50 mg total) by mouth 2 (two) times daily. Patient not taking: Reported on 05/24/2019 04/04/19 05/24/19  Brien Belvie BRAVO, MD  tiotropium (SPIRIVA  HANDIHALER) 18 MCG inhalation capsule Place 1 capsule (18 mcg total) into inhaler and inhale daily. Patient not taking: Reported on 05/24/2019 03/21/19 05/24/19  Alec House, MD      Allergies    Periactin [cyproheptadine]    Review of Systems   Review of Systems  Musculoskeletal:  Positive for back pain.    Physical Exam Updated Vital Signs BP (!) 121/99 (BP Location: Left Arm)   Pulse 89   Temp 98.1 F (36.7 C) (Oral)   Resp 20   SpO2 98%  Physical Exam Vitals and nursing note reviewed.  Constitutional:      General: She is not in acute  distress.    Appearance: She is not ill-appearing or toxic-appearing.  HENT:     Head: Normocephalic and atraumatic.     Mouth/Throat:     Mouth: Mucous membranes are moist.  Eyes:     General: No scleral icterus.       Right eye: No discharge.        Left eye: No discharge.     Conjunctiva/sclera: Conjunctivae normal.  Cardiovascular:     Rate and Rhythm: Normal rate and regular rhythm.     Pulses: Normal pulses.     Heart sounds: Normal heart sounds. No murmur heard. Pulmonary:     Effort: Pulmonary effort is normal. No respiratory distress.     Breath sounds: Normal breath sounds. No wheezing, rhonchi or rales.  Abdominal:     General: Abdomen is flat. Bowel sounds are normal. There is no distension.     Palpations: Abdomen is soft. There is no mass.  Tenderness: There is no abdominal tenderness.  Musculoskeletal:     Right lower leg: No edema.     Left lower leg: No edema.     Comments: Tenderness to palpation of left latissimus dorsi muscle. No crepitus or skin changes/lesions appreciated. No spinal tenderness to palpation.  Skin:    General: Skin is warm and dry.     Findings: No rash.  Neurological:     General: No focal deficit present.     Mental Status: She is alert and oriented to person, place, and time. Mental status is at baseline.  Psychiatric:        Mood and Affect: Mood normal.     ED Results / Procedures / Treatments   Labs (all labs ordered are listed, but only abnormal results are displayed) Labs Reviewed - No data to display  EKG None  Radiology No results found.  Procedures .Critical Care  Performed by: Shari Nidia FALCON, PA-C Authorized by: Shari Nidia FALCON, PA-C   Critical care provider statement:    Critical care time (minutes):  30   Critical care was necessary to treat or prevent imminent or life-threatening deterioration of the following conditions:  Renal failure   Critical care was time spent personally by me on the  following activities:  Development of treatment plan with patient or surrogate, discussions with consultants, evaluation of patient's response to treatment, examination of patient, ordering and review of laboratory studies, ordering and review of radiographic studies, ordering and performing treatments and interventions, pulse oximetry, re-evaluation of patient's condition and review of old charts   Care discussed with: admitting provider   Comments:     AKI requiring hospital admission     Medications Ordered in ED Medications - No data to display  ED Course/ Medical Decision Making/ A&P                                 Medical Decision Making Amount and/or Complexity of Data Reviewed Labs: ordered. Radiology: ordered.  Risk OTC drugs. Prescription drug management.    This patient presents to the ED for concern of left thoracic back pain, this involves an extensive number of treatment options, and is a complaint that carries with it a high risk of complications and morbidity.  The differential diagnosis includes pancreatitis, nephrolithiasis, UTI, pyelonephritis, cauda equina, cancer, trauma, muscle strain, aortic aneurysm.   Co morbidities that complicate the patient evaluation  HTN, aortic atherosclerosis, CHF, COPD   Additional history obtained:  Dr. Haze PCP 2017 ECHO: 65-70% EF   Problem List / ED Course / Critical interventions / Medication management  Patient presented for left thoracic back pain and constipation. Last BM yesterday. Physical exam with exquisite tenderness to palpation of left latissimus dorsi muscle. Rest of physical exam reassuring. Patient afebrile with stable vitals. I Ordered, and personally interpreted labs.  CBC without leukocytosis or anemia.  Lipase within normal limits.  Troponin within normal limits.   CMP with elevation of BUN/Cr at 102/3.19. patient not complaining of abdominal pain or urinary symptoms, but states that she has not been  drinking much water  recently and she has also been taking Bayer Powder for her back pain. Patient also with hx of CHF, but denies SOB, weight gain, or leg swelling and is complaint on her daily lasix . Overall, it seems that elevation in BUN/Cr is d/t dehydration and NSAID use, but will obtain CT imaging to further assess  for process affecting her kidneys. Will also obtain salicylate and tylenol  levels. Salicylate/Tylenol  levels within normal limits. I ordered imaging studies including left rib xray to assess for process contributing to patient's symptoms. I independently visualized and interpreted imaging and I agree with the radiologist interpretation of no acute MSK abnormalities. I personally viewed and interpreted the EKG/cardiac monitored which showed an underlying rhythm of: sinus rhythm I provided patient tylenol  and lidocaine  patch which did help relieve some of her back pain. Also provided IV fluids for her AKI. I have reviewed the patients home medicines and have made adjustments as needed   Social Determinants of Health:  none  3PM Care of Shari Obrien  transferred to PA [place]t at the end of my shift as the patient will require reassessment once labs/imaging have resulted. Patient presentation, ED course, and plan of care discussed with review of all pertinent labs and imaging. Please see his/her note for further details regarding further ED course and disposition. Plan at time of handoff is reassess patient after CT imaging. Patient will need to be admitted for AKI. This may be altered or completely changed at the discretion of the oncoming team pending results of further workup.         Final Clinical Impression(s) / ED Diagnoses Final diagnoses:  None    Rx / DC Orders ED Discharge Orders     None         Shari Obrien 03/09/24 1505    Dasie Faden, MD 03/12/24 1020

## 2024-03-09 NOTE — ED Provider Notes (Signed)
 Accepted handoff at shift change from [facility]. Please see prior provider note for full HPI.  Briefly: Patient is a 68 y.o. female who presents to the ER for L sided thoracic bank pain / flank pain x 2 weeks.  No known trauma.  Patient has been taking significant amounts of aspirin for pain.  Did have some constipation but had a bowel movement yesterday.  DDX/Plan: Found to have AKI with creatinine 3.19 (compared to 1.53 seven months ago) with concern for kidney injury following ongoing salicylate use.  Salicylate and acetaminophen  levels normal.  Given IV fluids, gentle due to hx of CHF.  Pending CT renal stone study, anticipate admission for AKI. Pain suspected to be musculoskeletal in origin.   Results  CT Renal Stone Study Result Date: 03/09/2024 CLINICAL DATA:  Abdominal/flank pain, stone suspected. EXAM: CT ABDOMEN AND PELVIS WITHOUT CONTRAST TECHNIQUE: Multidetector CT imaging of the abdomen and pelvis was performed following the standard protocol without IV contrast. RADIATION DOSE REDUCTION: This exam was performed according to the departmental dose-optimization program which includes automated exposure control, adjustment of the mA and/or kV according to patient size and/or use of iterative reconstruction technique. COMPARISON:  CT scan abdomen and pelvis report from 05/24/2019. Please note images are not available for review at the time of dictation. FINDINGS: Lower chest: There are patchy atelectatic changes in the visualized lung bases. No overt consolidation. No pleural effusion. The heart is normal in size. No pericardial effusion. Hepatobiliary: The liver is normal in size. Non-cirrhotic configuration. No suspicious mass. No intrahepatic or extrahepatic bile duct dilation. No calcified gallstones. Normal gallbladder wall thickness. No pericholecystic inflammatory changes. Pancreas: Unremarkable. No pancreatic ductal dilatation or surrounding inflammatory changes. Spleen: Within normal  limits. No focal lesion. Adrenals/Urinary Tract: Adrenal glands are unremarkable. No suspicious renal mass within the limitations of this unenhanced exam. There is a 3 x 5 mm nonobstructing calculus in the right kidney lower pole calyx. No other nephroureterolithiasis or obstructive uropathy on either side. Urinary bladder is under distended, precluding optimal assessment. However, no large mass or stones identified. No perivesical fat stranding. Stomach/Bowel: No disproportionate dilation of the small or large bowel loops. No evidence of abnormal bowel wall thickening or inflammatory changes. The appendix is unremarkable. There are multiple diverticula throughout the colon, without imaging signs of diverticulitis. Vascular/Lymphatic: No ascites or pneumoperitoneum. No abdominal or pelvic lymphadenopathy, by size criteria. No aneurysmal dilation of the major abdominal arteries. There are moderate peripheral atherosclerotic vascular calcifications of the aorta and its major branches. Reproductive: The uterus is surgically absent. No large adnexal mass. Other: There is a tiny fat containing umbilical hernia. The soft tissues and abdominal wall are otherwise unremarkable. Musculoskeletal: No suspicious osseous lesions. There are mild - moderate multilevel degenerative changes in the visualized spine. IMPRESSION: 1. There is a 3 x 5 mm nonobstructing calculus in the right kidney lower pole calyx. No other nephroureterolithiasis or obstructive uropathy on either side. 2. No acute inflammatory process identified within the abdomen or pelvis. Unremarkable appendix. 3. Multiple other nonacute observations, as described above. Aortic Atherosclerosis (ICD10-I70.0). Electronically Signed   By: Beula Brunswick M.D.   On: 03/09/2024 15:50   ED Course / MDM   Clinical Course as of 03/09/24 1603  Fri Mar 09, 2024  1544 [facility] Radiology to inquire about CT read results. Reading room will attempt to escalate  [LR]     Clinical Course User Index [LR] Czar Ysaguirre, Anna Kettering, PA-C   Medical Decision Making Amount  and/or Complexity of Data Reviewed Labs: ordered. Radiology: ordered.  Risk OTC drugs. Prescription drug management. Decision regarding hospitalization.  1550 -- CT renal stone study resulted. Shows non-obstructing calculus in right kidney, no other acute findings.   I consulted with [facility] hospitalist Dr Felipe Horton who agreed to admit patient. Appreciate their consultation.   The patient appears reasonably stabilized for admission considering the current resources, flow, and capabilities available in the ED at this time, and I doubt any other [facility] requiring further screening and/or treatment in the ED prior to admission.   Emmett Bracknell T, PA-C 03/09/24 1630    Nora Beal Victoria K, DO 03/09/24 1656
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
## 2024-03-09 NOTE — ED Notes (Signed)
 Tried 2x to get EKG but pt is complaining about her back and not being able to get comfortable.  Will try again after pain meds are administered

## 2024-03-10 ENCOUNTER — Inpatient Hospital Stay (HOSPITAL_COMMUNITY)

## 2024-03-10 DIAGNOSIS — N1832 Chronic kidney disease, stage 3b: Secondary | ICD-10-CM | POA: Diagnosis not present

## 2024-03-10 DIAGNOSIS — N179 Acute kidney failure, unspecified: Secondary | ICD-10-CM | POA: Diagnosis not present

## 2024-03-10 LAB — BASIC METABOLIC PANEL WITH GFR
Anion gap: 14 (ref 5–15)
BUN: 108 mg/dL — ABNORMAL HIGH (ref 8–23)
CO2: 25 mmol/L (ref 22–32)
Calcium: 9.6 mg/dL (ref 8.9–10.3)
Chloride: 98 mmol/L (ref 98–111)
Creatinine, Ser: 2.29 mg/dL — ABNORMAL HIGH (ref 0.44–1.00)
GFR, Estimated: 23 mL/min — ABNORMAL LOW (ref >60)
Glucose, Bld: 129 mg/dL — ABNORMAL HIGH (ref 70–99)
Potassium: 4.5 mmol/L (ref 3.5–5.1)
Sodium: 137 mmol/L (ref 135–145)

## 2024-03-10 LAB — C-REACTIVE PROTEIN: CRP: 10.5 mg/dL — ABNORMAL HIGH (ref <1.0)

## 2024-03-10 LAB — HIV ANTIBODY (ROUTINE TESTING W REFLEX): HIV Screen 4th Generation wRfx: NONREACTIVE

## 2024-03-10 LAB — CBC
HCT: 50.7 % — ABNORMAL HIGH (ref 36.0–46.0)
Hemoglobin: 16 g/dL — ABNORMAL HIGH (ref 12.0–15.0)
MCH: 28.8 pg (ref 26.0–34.0)
MCHC: 31.6 g/dL (ref 30.0–36.0)
MCV: 91.4 fL (ref 80.0–100.0)
Platelets: 268 10*3/uL (ref 150–400)
RBC: 5.55 MIL/uL — ABNORMAL HIGH (ref 3.87–5.11)
RDW: 13.9 % (ref 11.5–15.5)
WBC: 7.2 10*3/uL (ref 4.0–10.5)
nRBC: 0 % (ref 0.0–0.2)

## 2024-03-10 LAB — URINE CULTURE: Culture: NO GROWTH

## 2024-03-10 MED ORDER — ONDANSETRON HCL 4 MG/2ML IJ SOLN
4.0000 mg | Freq: Once | INTRAMUSCULAR | Status: AC
Start: 2024-03-10 — End: 2024-03-10
  Administered 2024-03-10: 4 mg via INTRAVENOUS
  Filled 2024-03-10: qty 2

## 2024-03-10 MED ORDER — LIDOCAINE 5 % EX PTCH
2.0000 | MEDICATED_PATCH | CUTANEOUS | Status: DC
  Administered 2024-03-11 – 2024-03-14 (×4): 2 via TRANSDERMAL
  Filled 2024-03-10 (×7): qty 2

## 2024-03-10 MED ORDER — METHOCARBAMOL 500 MG PO TABS
750.0000 mg | ORAL_TABLET | Freq: Four times a day (QID) | ORAL | Status: DC | PRN
Start: 2024-03-10 — End: 2024-03-12
  Administered 2024-03-10 – 2024-03-12 (×3): 750 mg via ORAL
  Filled 2024-03-10 (×3): qty 2

## 2024-03-10 MED ORDER — HYDRALAZINE HCL 25 MG PO TABS
25.0000 mg | ORAL_TABLET | Freq: Three times a day (TID) | ORAL | Status: DC
  Administered 2024-03-10 – 2024-03-15 (×17): 25 mg via ORAL
  Filled 2024-03-10 (×18): qty 1

## 2024-03-10 MED ORDER — METHOCARBAMOL 500 MG PO TABS
500.0000 mg | ORAL_TABLET | Freq: Four times a day (QID) | ORAL | Status: DC | PRN
Start: 2024-03-10 — End: 2024-03-10
  Administered 2024-03-10: 500 mg via ORAL
  Filled 2024-03-10: qty 1

## 2024-03-10 MED ORDER — ALUM & MAG HYDROXIDE-SIMETH 200-200-20 MG/5ML PO SUSP: 15.0000 mL | ORAL | Status: DC | PRN

## 2024-03-10 MED ORDER — ONDANSETRON HCL 4 MG/2ML IJ SOLN
4.0000 mg | Freq: Four times a day (QID) | INTRAMUSCULAR | Status: DC | PRN
  Administered 2024-03-10: 4 mg via INTRAVENOUS
  Filled 2024-03-10: qty 2

## 2024-03-10 MED ORDER — PANTOPRAZOLE SODIUM 40 MG IV SOLR
40.0000 mg | Freq: Two times a day (BID) | INTRAVENOUS | Status: DC
  Administered 2024-03-10 (×2): 40 mg via INTRAVENOUS
  Filled 2024-03-10 (×2): qty 10

## 2024-03-10 MED ORDER — ACETAMINOPHEN 500 MG PO TABS
1000.0000 mg | ORAL_TABLET | Freq: Three times a day (TID) | ORAL | Status: DC
  Administered 2024-03-10 – 2024-03-14 (×14): 1000 mg via ORAL
  Filled 2024-03-10 (×14): qty 2
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
## 2024-03-10 NOTE — Evaluation (Signed)
 Physical Therapy Evaluation Patient Details Name: Shari Obrien MRN: [id number] DOB: March 17, 1956 Today's Date: 03/10/2024  History of Present Illness  68 y.o. female presented with worsening left-sided back pain.WUJ:[id number], diastolic CHF, COPD, diabetes mellitus type 2, uterine fibroids s/p abdominal hysterectomy, and chronic kidney disease .  CT renal study showed 3 x 5 mm nonobstructing calculus in the right kidney. Lumbar OZH:[id number] degenerative changes of the lumbar spine with prominent  bilateral facet arthropathy, with varying degrees of spinal canal  stenosis and neural foraminal narrowing . MRI of thoracic spine pending  Clinical Impression  Pt admitted with above diagnosis.  Pt currently with functional limitations due to the deficits listed below (see PT Problem List). Pt will benefit from acute skilled PT to increase their independence and safety with mobility to allow discharge.     Patient received reclined in chair. Patient reporting significant  back pain, mid back. Patient constantly repositioning  in recliner while reclined. Assisted to sitting upright, patient yelling out in pain. Attempted to stand multiple times with +2 assistance, patient stopping  and states unable. Patient is needed to return to bed for MRI> Finally able to assist standing with +2 max support and step to bed holding onto RW, total assist to lift legs and return trunk to sidelying, patient yelling out,crying.  Patient will benefit from continued inpatient follow up therapy, <3 hours/day. Patient resides alone was ambulatory with RW .       If plan is discharge home, recommend the following: Two people to help with walking and/or transfers;A lot of help with bathing/dressing/bathroom;Assistance with cooking/housework;Assist for transportation;Help with stairs or ramp for entrance   Can travel by private vehicle   No    Equipment Recommendations None recommended by PT  Recommendations for Other  Services       Functional Status Assessment Patient has had a recent decline in their functional status and demonstrates the ability to make significant improvements in function in a reasonable and predictable amount of time.     Precautions / Restrictions Precautions Precautions: Fall Precaution/Restrictions Comments: significant back pain Restrictions Weight Bearing Restrictions Per Provider Order: No      Mobility  Bed Mobility Overal bed mobility: Needs Assistance Bed Mobility: Sit to Sidelying         Sit to sidelying: +2 for safety/equipment, +2 for physical assistance, HOB elevated, Total assist General bed mobility comments: patient took exceptional amount of time to allow  tho be assisted back to supine, patient yelling out with any position change.    Transfers Overall transfer level: Needs assistance Equipment used: Rolling walker (2 wheels) Transfers: Sit to/from Stand, Bed to chair/wheelchair/BSC Sit to Stand: Mod assist, +2 physical assistance, +2 safety/equipment   Step pivot transfers: Mod assist, +2 safety/equipment, +2 physical assistance       General transfer comment: exceptional ambount of time to stand, first trial never stood, then had to stand to return to bed to go to MRI so strongly encouraged patient to stand  and step to bed. Patient clinging onto RW    Ambulation/Gait                  Stairs            Wheelchair Mobility     Tilt Bed    Modified Rankin (Stroke Patients Only)       Balance Overall balance assessment: Needs assistance Sitting-balance support: Feet supported, Bilateral upper extremity supported Sitting balance-Leahy Scale: Fair Sitting balance - Comments:  patient constantly moving trunk,  jerking and reporting pain   Standing balance support: Bilateral upper extremity supported, During functional activity, Reliant on assistive device for balance Standing balance-Leahy Scale: Poor Standing balance  comment: reliant on UE support                             Pertinent Vitals/Pain Pain Assessment Pain Assessment: 0-10 Pain Score: 10-Worst pain ever Pain Location: thoracic  area Pain Descriptors / Indicators: Grimacing, Crying, Cramping, Guarding Pain Intervention(s): Limited activity within patient's tolerance, Monitored during session, Patient requesting pain meds-RN notified    Home Living Family/patient expects to be discharged to:: (P) Private residence Living Arrangements: (P) Alone Available Help at Discharge: Family;Available 24 hours/day Type of Home: Apartment Home Access: Level entry       Home Layout: One level Home Equipment: Agricultural consultant (2 wheels)      Prior Function Prior Level of Function : Independent/Modified Independent             Mobility Comments: has been  staying on couch, limited amb to BR       Extremity/Trunk Assessment   Upper Extremity Assessment Upper Extremity Assessment: Overall WFL for tasks assessed    Lower Extremity Assessment Lower Extremity Assessment:  (limited standing knees flexed when finally stood up)    Cervical / Trunk Assessment Cervical / Trunk Assessment: Other exceptions Cervical / Trunk Exceptions: constantly moving  in back  Communication   Communication Communication: No apparent difficulties    Cognition Arousal: Alert Behavior During Therapy: Anxious   PT - Cognitive impairments: No apparent impairments                         Following commands: Intact       Cueing       General Comments      Exercises     Assessment/Plan    PT Assessment Patient needs continued PT services  PT Problem List Decreased mobility;Decreased safety awareness;Decreased knowledge of precautions;Decreased activity tolerance;Decreased balance;Pain       PT Treatment Interventions DME instruction;Therapeutic exercise;Gait training;Functional mobility training;Therapeutic  activities;Patient/family education    PT Goals (Current goals can be found in the Care Plan section)  Acute Rehab PT Goals Patient Stated Goal: no Pain PT Goal Formulation: With patient Time For Goal Achievement: 03/24/24 Potential to Achieve Goals: Fair    Frequency Min 2X/week     Co-evaluation               AM-PAC PT "6 Clicks" Mobility  Outcome Measure Help needed turning from your back to your side while in a flat bed without using bedrails?: A Lot Help needed moving from lying on your back to sitting on the side of a flat bed without using bedrails?: Total Help needed moving to and from a bed to a chair (including a wheelchair)?: A Lot Help needed standing up from a chair using your arms (e.g., wheelchair or bedside chair)?: A Lot Help needed to walk in hospital room?: Total Help needed climbing 3-5 steps with a railing? : Total 6 Click Score: 9    End of Session Equipment Utilized During Treatment: Gait belt Activity Tolerance: Patient limited by pain Patient left: in bed;with nursing/sitter in room Nurse Communication: Mobility status PT Visit Diagnosis: Unsteadiness on feet (R26.81);Muscle weakness (generalized) (M62.81);Pain    Time: 8295-6213 PT Time Calculation (min) (ACUTE ONLY): 51 min  Charges:   PT Evaluation $PT Eval Moderate Complexity: 1 Mod PT Treatments $Therapeutic Activity: 23-37 mins PT General Charges $$ ACUTE PT VISIT: 1 Visit         Abelina Hoes PT Acute Rehabilitation Services Office [phone] Weekend pager-[phone]   Dareen Ebbing 03/10/2024, 3:14 PM

## 2024-03-10 NOTE — Progress Notes (Signed)
 PROGRESS NOTE    Shari Obrien  ZOX:[id number] DOB: 05-Jan-1956 DOA: 03/09/2024 PCP: Lorella Roles, MD   Brief Narrative:  68 y.o. female with medical history significant of hypertension, diastolic CHF, COPD, diabetes mellitus type 2, uterine fibroids s/p abdominal hysterectomy, and chronic kidney disease stage IIIb presented with worsening left-sided back pain.  On presentation, creatinine was 3.19.  UA was negative for leukocytes or nitrites but showed multiple bacteria.  CT renal study showed 3 x 5 mm nonobstructing calculus in the right kidney lower pole calyx with no other stones or obstructive uropathy.  She was started on IV fluids and analgesics.  Assessment & Plan:   Acute kidney injury on CKD stage IIIb Acute metabolic acidosis: Resolved - Creatinine was 1.53 in 07/2023.  Presented with creatinine of 3.19.  Possibly from recent excessive NSAID use along with combination of diuretics and lisinopril .  CT renal study as above. - Creatinine improving to 2.29 today.  Continue IV fluids.  Monitor creatinine  Left flank/upper back pain - Acute.  Ongoing for 2 weeks.  Renal CT did not show any cause for the patient's symptoms.  MRI of thoracic spine pending.  ESR and CRP elevated - Continue current pain management regimen  Hyponatremia - Improved  Chronic diastolic heart failure - Currently euvolemic.  Continue strict input output.  Daily weights.  Outpatient follow-up with PCP and/or cardiology.  Last echocardiogram was in 2017 which had shown EF of 65 to 70% with grade 2 diastolic dysfunction.  Diuretics and lisinopril  on hold.  Essential hypertension Hyperlipidemia -Continue amlodipine  and statin.  Lisinopril , Aldactone  and Lasix  added.  Monitor blood pressure  Nephrolithiasis - Outpatient follow-up with urology as needed  Tobacco abuse - Counseled regarding cessation by admitting hospitalist    DVT prophylaxis: Heparin  subcutaneous Code Status: Full Family  Communication: None at bedside Disposition Plan: Status is: Inpatient Remains inpatient appropriate because: Of severity of illness    Consultants: None  Procedures: None  Antimicrobials: None   Subjective: Patient seen and examined at bedside.  Patient was vomiting earlier this morning with complaints of flank pain for which patient received Zofran  and Dilaudid .  Currently very slow to respond.  No fever, seizures or agitation reported.  Objective: Vitals:   03/09/24 1750 03/09/24 2202 03/10/24 0100 03/10/24 0507  BP: (!) 167/53 (!) 145/72 (!) 141/95 (!) 153/103  Pulse: (!) 103 82 95 78  Resp: (!) 21 20 20 20   Temp: 97.8 F (36.6 C) 97.7 F (36.5 C) 97.6 F (36.4 C) 97.6 F (36.4 C)  TempSrc: Oral Oral Oral Oral  SpO2: 100% 91% 91% 92%    Intake/Output Summary (Last 24 hours) at 03/10/2024 0737 Last data filed at 03/10/2024 0645 Gross per 24 hour  Intake 446.78 ml  Output 300 ml  Net 146.78 ml   There were no vitals filed for this visit.  Examination:  General exam: Appears calm and comfortable  Respiratory system: Bilateral decreased breath sounds at bases, no wheezing Cardiovascular system: S1 & S2 heard, Rate controlled Gastrointestinal system: Abdomen is nondistended, soft and nontender. Normal bowel sounds heard. Extremities: No cyanosis, clubbing, edema  Central nervous system: Wakes up very slightly, extremely slow to respond, no focal neurological deficits. Moving extremities Skin: No rashes, lesions or ulcers Psychiatry: Flat affect.  Not agitated.   Data Reviewed: I have personally reviewed following labs and imaging studies  CBC: Recent Labs  Lab 03/09/24 1103 03/10/24 0553  WBC 9.0 7.2  NEUTROABS 6.2  --  HGB 16.2* 16.0*  HCT 49.7* 50.7*  MCV 89.4 91.4  PLT 256 268   Basic Metabolic Panel: Recent Labs  Lab 03/09/24 1103 03/10/24 0553  NA 132* 137  K 4.7 4.5  CL 99 98  CO2 19* 25  GLUCOSE 96 129*  BUN 102* 108*  CREATININE 3.19*  2.29*  CALCIUM  8.7* 9.6   GFR: CrCl cannot be calculated (Unknown ideal weight.). Liver Function Tests: Recent Labs  Lab 03/09/24 1103  AST 20  ALT 14  ALKPHOS 64  BILITOT 0.6  PROT 7.4  ALBUMIN 3.6   Recent Labs  Lab 03/09/24 1103  LIPASE 44   No results for input(s): "AMMONIA" in the last 168 hours. Coagulation Profile: No results for input(s): "INR", "PROTIME" in the last 168 hours. Cardiac Enzymes: No results for input(s): "CKTOTAL", "CKMB", "CKMBINDEX", "TROPONINI" in the last 168 hours. BNP (last 3 results) No results for input(s): "PROBNP" in the last 8760 hours. HbA1C: No results for input(s): "HGBA1C" in the last 72 hours. CBG: No results for input(s): "GLUCAP" in the last 168 hours. Lipid Profile: No results for input(s): "CHOL", "HDL", "LDLCALC", "TRIG", "CHOLHDL", "LDLDIRECT" in the last 72 hours. Thyroid Function Tests: No results for input(s): "TSH", "T4TOTAL", "FREET4", "T3FREE", "THYROIDAB" in the last 72 hours. Anemia Panel: No results for input(s): "VITAMINB12", "FOLATE", "FERRITIN", "TIBC", "IRON", "RETICCTPCT" in the last 72 hours. Sepsis Labs: No results for input(s): "PROCALCITON", "LATICACIDVEN" in the last 168 hours.  No results found for this or any previous visit (from the past 240 hours).       Radiology Studies: CT Renal Stone Study Result Date: 03/09/2024 CLINICAL DATA:  Abdominal/flank pain, stone suspected. EXAM: CT ABDOMEN AND PELVIS WITHOUT CONTRAST TECHNIQUE: Multidetector CT imaging of the abdomen and pelvis was performed following the standard protocol without IV contrast. RADIATION DOSE REDUCTION: This exam was performed according to the departmental dose-optimization program which includes automated exposure control, adjustment of the mA and/or kV according to patient size and/or use of iterative reconstruction technique. COMPARISON:  CT scan abdomen and pelvis report from 05/24/2019. Please note images are not available for  review at the time of dictation. FINDINGS: Lower chest: There are patchy atelectatic changes in the visualized lung bases. No overt consolidation. No pleural effusion. The heart is normal in size. No pericardial effusion. Hepatobiliary: The liver is normal in size. Non-cirrhotic configuration. No suspicious mass. No intrahepatic or extrahepatic bile duct dilation. No calcified gallstones. Normal gallbladder wall thickness. No pericholecystic inflammatory changes. Pancreas: Unremarkable. No pancreatic ductal dilatation or surrounding inflammatory changes. Spleen: Within normal limits. No focal lesion. Adrenals/Urinary Tract: Adrenal glands are unremarkable. No suspicious renal mass within the limitations of this unenhanced exam. There is a 3 x 5 mm nonobstructing calculus in the right kidney lower pole calyx. No other nephroureterolithiasis or obstructive uropathy on either side. Urinary bladder is under distended, precluding optimal assessment. However, no large mass or stones identified. No perivesical fat stranding. Stomach/Bowel: No disproportionate dilation of the small or large bowel loops. No evidence of abnormal bowel wall thickening or inflammatory changes. The appendix is unremarkable. There are multiple diverticula throughout the colon, without imaging signs of diverticulitis. Vascular/Lymphatic: No ascites or pneumoperitoneum. No abdominal or pelvic lymphadenopathy, by size criteria. No aneurysmal dilation of the major abdominal arteries. There are moderate peripheral atherosclerotic vascular calcifications of the aorta and its major branches. Reproductive: The uterus is surgically absent. No large adnexal mass. Other: There is a tiny fat containing umbilical hernia. The soft tissues and  abdominal wall are otherwise unremarkable. Musculoskeletal: No suspicious osseous lesions. There are mild - moderate multilevel degenerative changes in the visualized spine. IMPRESSION: 1. There is a 3 x 5 mm  nonobstructing calculus in the right kidney lower pole calyx. No other nephroureterolithiasis or obstructive uropathy on either side. 2. No acute inflammatory process identified within the abdomen or pelvis. Unremarkable appendix. 3. Multiple other nonacute observations, as described above. Aortic Atherosclerosis (ICD10-I70.0). Electronically Signed   By: Beula Brunswick M.D.   On: 03/09/2024 15:50   DG Ribs Unilateral W/Chest Left Result Date: 03/09/2024 CLINICAL DATA:  2 week history of left-sided upper back pain EXAM: LEFT RIBS AND CHEST - 5 VIEW COMPARISON:  Chest radiograph dated 02/27/2019 FINDINGS: No acute displaced fracture or other bone lesions are seen involving the ribs. No pneumothorax. Trace blunting of the left costophrenic angle. Right mid and left lower lung linear opacities. Heart size and mediastinal contours are within normal limits. IMPRESSION: 1. No acute displaced rib fracture. 2. Trace blunting of the left costophrenic angle, which may represent a trace pleural effusion versus pleural thickening. 3. Right mid and left lower lung linear opacities, likely atelectasis. Electronically Signed   By: Limin  Xu M.D.   On: 03/09/2024 11:45        Scheduled Meds:  amLODipine   10 mg Oral Daily   atorvastatin   20 mg Oral Daily   heparin   5,000 Units Subcutaneous Q8H   lidocaine   1 patch Transdermal Q24H   sodium chloride  flush  3 mL Intravenous Q12H   Continuous Infusions:  sodium chloride  75 mL/hr at 03/10/24 0351          Audria Leather, MD [facility] 03/10/2024, 7:37 AM

## 2024-03-11 ENCOUNTER — Inpatient Hospital Stay (HOSPITAL_COMMUNITY)

## 2024-03-11 DIAGNOSIS — N1832 Chronic kidney disease, stage 3b: Secondary | ICD-10-CM | POA: Diagnosis not present

## 2024-03-11 DIAGNOSIS — N179 Acute kidney failure, unspecified: Secondary | ICD-10-CM | POA: Diagnosis not present

## 2024-03-11 LAB — BASIC METABOLIC PANEL WITH GFR
Anion gap: 11 (ref 5–15)
BUN: 80 mg/dL — ABNORMAL HIGH (ref 8–23)
CO2: 22 mmol/L (ref 22–32)
Calcium: 8.8 mg/dL — ABNORMAL LOW (ref 8.9–10.3)
Chloride: 104 mmol/L (ref 98–111)
Creatinine, Ser: 1.64 mg/dL — ABNORMAL HIGH (ref 0.44–1.00)
GFR, Estimated: 34 mL/min — ABNORMAL LOW (ref >60)
Glucose, Bld: 102 mg/dL — ABNORMAL HIGH (ref 70–99)
Potassium: 4.8 mmol/L (ref 3.5–5.1)
Sodium: 137 mmol/L (ref 135–145)

## 2024-03-11 LAB — MAGNESIUM: Magnesium: 2.6 mg/dL — ABNORMAL HIGH (ref 1.7–2.4)

## 2024-03-11 LAB — UREA NITROGEN, URINE: Urea Nitrogen, Ur: 651 mg/dL

## 2024-03-11 MED ORDER — PANTOPRAZOLE SODIUM 40 MG PO TBEC
40.0000 mg | DELAYED_RELEASE_TABLET | Freq: Two times a day (BID) | ORAL | Status: DC
  Administered 2024-03-11 – 2024-03-16 (×11): 40 mg via ORAL
  Filled 2024-03-11 (×11): qty 1

## 2024-03-11 MED ORDER — LORAZEPAM 2 MG/ML IJ SOLN
0.5000 mg | Freq: Once | INTRAMUSCULAR | Status: AC
  Administered 2024-03-11: 0.5 mg via INTRAVENOUS
  Filled 2024-03-11: qty 1

## 2024-03-11 NOTE — Plan of Care (Signed)

## 2024-03-11 NOTE — Progress Notes (Signed)
 PROGRESS NOTE    Shari Obrien  ZOX:[id number] DOB: 1956/01/01 DOA: 03/09/2024 PCP: Lorella Roles, MD   Brief Narrative:  68 y.o. female with medical history significant of hypertension, diastolic CHF, COPD, diabetes mellitus type 2, uterine fibroids s/p abdominal hysterectomy, and chronic kidney disease stage IIIb presented with worsening left-sided back pain.  On presentation, creatinine was 3.19.  UA was negative for leukocytes or nitrites but showed multiple bacteria.  CT renal study showed 3 x 5 mm nonobstructing calculus in the right kidney lower pole calyx with no other stones or obstructive uropathy.  She was started on IV fluids and analgesics.  Assessment & Plan:   Acute kidney injury on CKD stage IIIb Acute metabolic acidosis: Resolved - Creatinine was 1.53 in 07/2023.  Presented with creatinine of 3.19.  Possibly from recent excessive NSAID use along with combination of diuretics and lisinopril .  CT renal study as above. - Creatinine improving to 1.64 today.  Encourage oral intake.  DC IV fluids.  Monitor creatinine  Left flank/upper back pain - Acute.  Ongoing for 2 weeks.  Renal CT did not show any cause for the patient's symptoms.  MRI of thoracic spine still pending: MRI attempted few times yesterday but patient could not lay down flat and was claustrophobic.  Will try and attempt MRI again today with as needed Ativan  along with pain meds.  ESR and CRP elevated - Continue current pain management regimen - PT recommending SNF placement.  Consult TOC  Hyponatremia - Improved  Chronic diastolic heart failure - Currently euvolemic.  Continue strict input output.  Daily weights.  Outpatient follow-up with PCP and/or cardiology.  Last echocardiogram was in 2017 which had shown EF of 65 to 70% with grade 2 diastolic dysfunction.  Diuretics and lisinopril  on hold.  Essential hypertension Hyperlipidemia -Continue amlodipine  and statin.  Lisinopril , Aldactone  and Lasix  added.   Monitor blood pressure.  Oral hydralazine  has been started on 03/10/2024.  Nephrolithiasis - Outpatient follow-up with urology as needed  Tobacco abuse - Counseled regarding cessation by admitting hospitalist    DVT prophylaxis: Heparin  subcutaneous Code Status: Full Family Communication: None at bedside Disposition Plan: Status is: Inpatient Remains inpatient appropriate because: Of severity of illness.  Need for SNF placement.    Consultants: None  Procedures: None  Antimicrobials: None   Subjective: Patient seen and examined at bedside.  Continues to have flank pain with intermittent nausea.  No fever, seizures or agitation reported. Objective: Vitals:   03/10/24 1251 03/10/24 1957 03/11/24 0421 03/11/24 0428  BP: 136/80 132/83 (!) 156/78 (!) 156/78  Pulse: 86 94 93   Resp: (!) 21  18   Temp: (!) 97.5 F (36.4 C) 97.8 F (36.6 C) 97.8 F (36.6 C)   TempSrc: Oral Oral Oral   SpO2: 94% 93% 92%     Intake/Output Summary (Last 24 hours) at 03/11/2024 0759 Last data filed at 03/11/2024 0015 Gross per 24 hour  Intake 3 ml  Output 900 ml  Net -897 ml   There were no vitals filed for this visit.  Examination:  General: On room air.  No distress ENT/neck: No thyromegaly.  JVD is not elevated  respiratory: Decreased breath sounds at bases bilaterally with some crackles; no wheezing  CVS: S1-S2 heard, rate controlled currently Abdominal: Soft, nontender, slightly distended; no organomegaly, bowel sounds are heard Extremities: Trace lower extremity edema; no cyanosis  CNS: More awake this morning but still slow to respond.  Poor historian.  No focal neurologic  deficit.  Moves extremities Lymph: No obvious lymphadenopathy Skin: No obvious ecchymosis/lesions  psych: Continues to have flat affect with no signs of agitation.  Musculoskeletal: No obvious joint swelling/deformity    Data Reviewed: I have personally reviewed following labs and imaging  studies  CBC: Recent Labs  Lab 03/09/24 1103 03/10/24 0553  WBC 9.0 7.2  NEUTROABS 6.2  --   HGB 16.2* 16.0*  HCT 49.7* 50.7*  MCV 89.4 91.4  PLT 256 268   Basic Metabolic Panel: Recent Labs  Lab 03/09/24 1103 03/10/24 0553 03/11/24 0517  NA 132* 137 137  K 4.7 4.5 4.8  CL 99 98 104  CO2 19* 25 22  GLUCOSE 96 129* 102*  BUN 102* 108* 80*  CREATININE 3.19* 2.29* 1.64*  CALCIUM  8.7* 9.6 8.8*  MG  --   --  2.6*   GFR: CrCl cannot be calculated (Unknown ideal weight.). Liver Function Tests: Recent Labs  Lab 03/09/24 1103  AST 20  ALT 14  ALKPHOS 64  BILITOT 0.6  PROT 7.4  ALBUMIN 3.6   Recent Labs  Lab 03/09/24 1103  LIPASE 44   No results for input(s): "AMMONIA" in the last 168 hours. Coagulation Profile: No results for input(s): "INR", "PROTIME" in the last 168 hours. Cardiac Enzymes: No results for input(s): "CKTOTAL", "CKMB", "CKMBINDEX", "TROPONINI" in the last 168 hours. BNP (last 3 results) No results for input(s): "PROBNP" in the last 8760 hours. HbA1C: No results for input(s): "HGBA1C" in the last 72 hours. CBG: No results for input(s): "GLUCAP" in the last 168 hours. Lipid Profile: No results for input(s): "CHOL", "HDL", "LDLCALC", "TRIG", "CHOLHDL", "LDLDIRECT" in the last 72 hours. Thyroid Function Tests: No results for input(s): "TSH", "T4TOTAL", "FREET4", "T3FREE", "THYROIDAB" in the last 72 hours. Anemia Panel: No results for input(s): "VITAMINB12", "FOLATE", "FERRITIN", "TIBC", "IRON", "RETICCTPCT" in the last 72 hours. Sepsis Labs: No results for input(s): "PROCALCITON", "LATICACIDVEN" in the last 168 hours.  Recent Results (from the past 240 hours)  Urine Culture (for pregnant, neutropenic or urologic patients or patients with an indwelling urinary catheter)     Status: None   Collection Time: 03/09/24 12:55 PM   Specimen: Urine, Clean Catch  Result Value Ref Range Status   Specimen Description   Final    URINE, CLEAN  CATCH Performed at [facility], [address], [place], [place] [address]    Special Requests   Final    NONE Performed at [facility], [address], [place], [place] [address]    Culture   Final    NO GROWTH Performed at [facility], [address], [place], [place] [address]    Report Status 03/10/2024 FINAL  Final         Radiology Studies: CT Renal Stone Study Result Date: 03/09/2024 CLINICAL DATA:  Abdominal/flank pain, stone suspected. EXAM: CT ABDOMEN AND PELVIS WITHOUT CONTRAST TECHNIQUE: Multidetector CT imaging of the abdomen and pelvis was performed following the standard protocol without IV contrast. RADIATION DOSE REDUCTION: This exam was performed according to the departmental dose-optimization program which includes automated exposure control, adjustment of the mA and/or kV according to patient size and/or use of iterative reconstruction technique. COMPARISON:  CT scan abdomen and pelvis report from 05/24/2019. Please note images are not available for review at the time of dictation. FINDINGS: Lower chest: There are patchy atelectatic changes in the visualized lung bases. No overt consolidation. No pleural effusion. The heart is normal in size. No  pericardial effusion. Hepatobiliary: The liver is normal in size. Non-cirrhotic configuration. No suspicious mass. No intrahepatic or extrahepatic bile duct dilation. No calcified gallstones. Normal gallbladder wall thickness. No pericholecystic inflammatory changes. Pancreas: Unremarkable. No pancreatic ductal dilatation or surrounding inflammatory changes. Spleen: Within normal limits. No focal lesion. Adrenals/Urinary Tract: Adrenal glands are unremarkable. No suspicious renal mass within the limitations of this unenhanced exam. There is a 3 x 5 mm nonobstructing calculus in the right kidney lower pole calyx. No other nephroureterolithiasis or obstructive uropathy on either side.  Urinary bladder is under distended, precluding optimal assessment. However, no large mass or stones identified. No perivesical fat stranding. Stomach/Bowel: No disproportionate dilation of the small or large bowel loops. No evidence of abnormal bowel wall thickening or inflammatory changes. The appendix is unremarkable. There are multiple diverticula throughout the colon, without imaging signs of diverticulitis. Vascular/Lymphatic: No ascites or pneumoperitoneum. No abdominal or pelvic lymphadenopathy, by size criteria. No aneurysmal dilation of the major abdominal arteries. There are moderate peripheral atherosclerotic vascular calcifications of the aorta and its major branches. Reproductive: The uterus is surgically absent. No large adnexal mass. Other: There is a tiny fat containing umbilical hernia. The soft tissues and abdominal wall are otherwise unremarkable. Musculoskeletal: No suspicious osseous lesions. There are mild - moderate multilevel degenerative changes in the visualized spine. IMPRESSION: 1. There is a 3 x 5 mm nonobstructing calculus in the right kidney lower pole calyx. No other nephroureterolithiasis or obstructive uropathy on either side. 2. No acute inflammatory process identified within the abdomen or pelvis. Unremarkable appendix. 3. Multiple other nonacute observations, as described above. Aortic Atherosclerosis (ICD10-I70.0). Electronically Signed   By: Beula Brunswick M.D.   On: 03/09/2024 15:50   DG Ribs Unilateral W/Chest Left Result Date: 03/09/2024 CLINICAL DATA:  2 week history of left-sided upper back pain EXAM: LEFT RIBS AND CHEST - 5 VIEW COMPARISON:  Chest radiograph dated 02/27/2019 FINDINGS: No acute displaced fracture or other bone lesions are seen involving the ribs. No pneumothorax. Trace blunting of the left costophrenic angle. Right mid and left lower lung linear opacities. Heart size and mediastinal contours are within normal limits. IMPRESSION: 1. No acute displaced  rib fracture. 2. Trace blunting of the left costophrenic angle, which may represent a trace pleural effusion versus pleural thickening. 3. Right mid and left lower lung linear opacities, likely atelectasis. Electronically Signed   By: Limin  Xu M.D.   On: 03/09/2024 11:45        Scheduled Meds:  acetaminophen   1,000 mg Oral Q8H   amLODipine   10 mg Oral Daily   atorvastatin   20 mg Oral Daily   heparin   5,000 Units Subcutaneous Q8H   hydrALAZINE   25 mg Oral Q8H   lidocaine   2 patch Transdermal Q24H   LORazepam   0.5 mg Intravenous Once   pantoprazole  (PROTONIX ) IV  40 mg Intravenous Q12H   sodium chloride  flush  3 mL Intravenous Q12H   Continuous Infusions:          Audria Leather, MD [facility] 03/11/2024, 7:59 AM

## 2024-03-11 NOTE — Evaluation (Signed)
 Occupational Therapy Evaluation Patient Details Name: Shari Obrien MRN: [id number] DOB: 1956/04/30 Today's Date: 03/11/2024   History of Present Illness   68 yr old female presented with worsening left-sided back pain. CT renal study showed 3 x 5 mm nonobstructing calculus in the right kidney. Lumbar WUJ:[id number] degenerative changes of the lumbar spine with prominent  bilateral facet arthropathy, with varying degrees of spinal canal  stenosis and neural foraminal narrowing  PMH: hypertension, diastolic CHF, COPD, diabetes mellitus type 2, uterine fibroids s/p abdominal hysterectomy, and chronic kidney disease    Clinical Impressions The pt is currently presenting below her baseline level of functioning for self-care management, as she is limited by the below listed deficits (see OT problem list). During the session, she demonstrated decreased activity tolerance, guarding of movements, and significant reluctance to attempt progressive activity, due to back pain. As such, the eval was limited to bed level only. The pt is not able to manage or perform normal functional activities, given her limitations. She will benefit from further OT services to facilitate improved ADL performance and to decrease the risk for restricted participation in meaningful activities. Patient will benefit from continued inpatient follow up therapy, <3 hours/day.      If plan is discharge home, recommend the following:   Two people to help with walking and/or transfers;A lot of help with bathing/dressing/bathroom;Assistance with cooking/housework     Functional Status Assessment   Patient has had a recent decline in their functional status and demonstrates the ability to make significant improvements in function in a reasonable and predictable amount of time.     Equipment Recommendations   BSC/3in1     Recommendations for Other Services         Precautions/Restrictions    Precautions Precautions: Fall Precaution/Restrictions Comments: significant back pain Restrictions Weight Bearing Restrictions Per Provider Order: No     Mobility Bed Mobility      General bed mobility comments: Pt declined to attempt bed mobility, due to back pain    Transfers        General transfer comment: Pt deferred attempts at out of bed activity          ADL either performed or assessed with clinical judgement   ADL   Eating/Feeding: Independent;Bed level   Grooming: Set up;Bed level           Upper Body Dressing : Minimal assistance;Bed level     Lower Body Dressing Details (indicate cue type and reason): The pt verbalized she could doff and donn her socks at bed level, however she would not attempt doing so at the request of this OT during the evaluation                     Vision   Additional Comments: She correctly read the time depicted on the wall clock.            Pertinent Vitals/Pain Pain Assessment Pain Assessment: 0-10 Pain Score: 8  Pain Location: back Pain Intervention(s): Limited activity within patient's tolerance, Monitored during session     Extremity/Trunk Assessment Upper Extremity Assessment Upper Extremity Assessment: Overall WFL for tasks assessed   Lower Extremity Assessment Lower Extremity Assessment:  (Pt presented decreased effort, during formal ROM testing attempts. B LE AROM appeared grossly [facility])       Communication Communication Communication: No apparent difficulties   Cognition Arousal: Alert   Cognition: Difficult to assess  OT - Cognition Comments: Oriented x4                 Following commands: Intact                  Home Living Family/patient expects to be discharged to:: Private residence Living Arrangements: Alone   Type of Home: Apartment Home Access: Level entry     Home Layout: One level     Bathroom Shower/Tub: Tub/shower unit         Home  Equipment: Agricultural consultant (2 wheels)          Prior Functioning/Environment Prior Level of Function : Independent/Modified Independent               ADLs Comments: Independent with ADLs. Does not drive.    OT Problem List: Pain;Decreased activity tolerance;Impaired balance (sitting and/or standing)   OT Treatment/Interventions: Self-care/ADL training;Therapeutic exercise;DME and/or AE instruction;Therapeutic activities      OT Goals(Current goals can be found in the care plan section)   Acute Rehab OT Goals Patient Stated Goal: decreased pain OT Goal Formulation: With patient Time For Goal Achievement: 03/25/24 Potential to Achieve Goals:  (Guarded) ADL Goals Pt Will Perform Lower Body Dressing: with contact guard assist;sitting/lateral leans;with adaptive equipment Pt Will Transfer to Toilet: with contact guard assist;ambulating Pt Will Perform Toileting - Clothing Manipulation and hygiene: with contact guard assist;sit to/from stand   OT Frequency:  Min 2X/week       AM-PAC OT "6 Clicks" Daily Activity     Outcome Measure Help from another person eating meals?: None Help from another person taking care of personal grooming?: A Little Help from another person toileting, which includes using toliet, bedpan, or urinal?: A Lot Help from another person bathing (including washing, rinsing, drying)?: A Lot Help from another person to put on and taking off regular upper body clothing?: A Little Help from another person to put on and taking off regular lower body clothing?: A Lot 6 Click Score: 16   End of Session Equipment Utilized During Treatment: Other (comment) (N/A) Nurse Communication: Other (comment)  Activity Tolerance: Patient limited by pain Patient left: in bed;with call bell/phone within reach;with bed alarm set  OT Visit Diagnosis: Pain                Time: 1308-6578 OT Time Calculation (min): 15 min Charges:  OT General Charges $OT Visit: 1  Visit OT Evaluation $OT Eval Moderate Complexity: 1 Mod    Shasta Chinn L Alhaji Mcneal, OTR/L 03/11/2024, 8:45 PM

## 2024-03-12 ENCOUNTER — Inpatient Hospital Stay (HOSPITAL_COMMUNITY)

## 2024-03-12 DIAGNOSIS — N1832 Chronic kidney disease, stage 3b: Secondary | ICD-10-CM | POA: Diagnosis not present

## 2024-03-12 DIAGNOSIS — N179 Acute kidney failure, unspecified: Secondary | ICD-10-CM | POA: Diagnosis not present

## 2024-03-12 MED ORDER — SENNOSIDES-DOCUSATE SODIUM 8.6-50 MG PO TABS
1.0000 | ORAL_TABLET | Freq: Two times a day (BID) | ORAL | Status: DC
  Administered 2024-03-12 (×2): 1 via ORAL
  Filled 2024-03-12 (×2): qty 1

## 2024-03-12 MED ORDER — BISACODYL 10 MG RE SUPP
10.0000 mg | Freq: Every day | RECTAL | Status: DC | PRN
  Administered 2024-03-14: 10 mg via RECTAL
  Filled 2024-03-12: qty 1

## 2024-03-12 MED ORDER — OXYCODONE HCL 5 MG PO TABS
5.0000 mg | ORAL_TABLET | ORAL | Status: DC | PRN
  Administered 2024-03-12 – 2024-03-16 (×14): 10 mg via ORAL
  Filled 2024-03-12 (×14): qty 2

## 2024-03-12 MED ORDER — METHOCARBAMOL 500 MG PO TABS
1000.0000 mg | ORAL_TABLET | Freq: Four times a day (QID) | ORAL | Status: DC | PRN
  Administered 2024-03-12 – 2024-03-14 (×4): 1000 mg via ORAL
  Filled 2024-03-12 (×4): qty 2

## 2024-03-12 MED ORDER — LORAZEPAM 2 MG/ML IJ SOLN
0.5000 mg | Freq: Once | INTRAMUSCULAR | Status: AC
  Administered 2024-03-12: 0.5 mg via INTRAVENOUS
  Filled 2024-03-12: qty 1

## 2024-03-12 MED ORDER — POLYETHYLENE GLYCOL 3350 17 G PO PACK
17.0000 g | PACK | Freq: Every day | ORAL | Status: DC
  Administered 2024-03-12 – 2024-03-14 (×3): 17 g via ORAL
  Filled 2024-03-12 (×3): qty 1

## 2024-03-12 MED ORDER — MORPHINE SULFATE (PF) 2 MG/ML IV SOLN
2.0000 mg | INTRAVENOUS | Status: DC | PRN
  Administered 2024-03-12 – 2024-03-13 (×3): 2 mg via INTRAVENOUS
  Filled 2024-03-12 (×3): qty 1

## 2024-03-12 NOTE — TOC Progression Note (Signed)
 Transition of Care [facility]) - Progression Note    Patient Details  Name: Shari Obrien MRN: [id number] Date of Birth: January 12, 1956  Transition of Care [facility]) CM/SW Contact  Athalee Esterline, Thersia Flax, RN Phone Number: 03/12/2024, 3:02 PM  Clinical Narrative:  Faxed out await bed offers, choice,then auth.     Expected Discharge Plan: Skilled Nursing Facility Barriers to Discharge: Continued Medical Work up  Expected Discharge Plan and Services In-house Referral: NA     Living arrangements for the past 2 months: Apartment                 DME Arranged: N/A DME Agency: NA       HH Arranged: NA HH Agency: NA         Social Determinants of Health (SDOH) Interventions SDOH Screenings   Food Insecurity: No Food Insecurity (03/09/2024)  Housing: Low Risk  (03/09/2024)  Transportation Needs: No Transportation Needs (03/09/2024)  Utilities: Not At Risk (03/09/2024)  Depression (PHQ2-9): High Risk (08/04/2021)  Social Connections: Unknown (03/09/2024)  Tobacco Use: High Risk (03/09/2024)    Readmission Risk Interventions    03/10/2024    2:39 PM  Readmission Risk Prevention Plan  Post Dischage Appt Complete  Medication Screening Complete  Transportation Screening Complete
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
## 2024-03-12 NOTE — Progress Notes (Signed)
 PROGRESS NOTE    Shari Obrien  HQI:[id number] DOB: [birth year] DOA: 03/09/2024 PCP: Lorella Roles, MD   Brief Narrative:  68 y.o. female with medical history significant of hypertension, diastolic CHF, COPD, diabetes mellitus type 2, uterine fibroids s/p abdominal hysterectomy, and chronic kidney disease stage IIIb presented with worsening left-sided back pain.  On presentation, creatinine was 3.19.  UA was negative for leukocytes or nitrites but showed multiple bacteria.  CT renal study showed 3 x 5 mm nonobstructing calculus in the right kidney lower pole calyx with no other stones or obstructive uropathy.  She was started on IV fluids and analgesics.  Assessment & Plan:   Acute kidney injury on CKD stage IIIb Acute metabolic acidosis: Resolved - Creatinine was 1.53 in 07/2023.  Presented with creatinine of 3.19.  Possibly from recent excessive NSAID use along with combination of diuretics and lisinopril .  CT renal study as above. - Creatinine improving to 1.64 on 03/11/2024.  Labs pending today.  Encourage oral intake.  Off IV fluids.  Monitor creatinine  Left flank/upper back pain - Acute.  Ongoing for 2 weeks.  Renal CT did not show any cause for the patient's symptoms.  MRI of thoracic spine showed severe canal stenosis at C5-C6 with prominent disc protrusion; moderate central canal narrowing at C6-7; moderate left foraminal stenosis at T8-9  - Continue current pain management regimen - PT recommending SNF placement.  TOC consulted.  Hyponatremia - Improved.  Labs pending today.  Chronic diastolic heart failure - Currently euvolemic.  Continue strict input output.  Daily weights.  Outpatient follow-up with PCP and/or cardiology.  Last echocardiogram was in 2017 which had shown EF of 65 to 70% with grade 2 diastolic dysfunction.  Diuretics and lisinopril  on hold.  Essential hypertension Hyperlipidemia -Continue amlodipine  and statin.  Lisinopril , Aldactone  and Lasix  added.   Monitor blood pressure.  Oral hydralazine  has been started on 03/10/2024.  Nephrolithiasis - Outpatient follow-up with urology as needed  Tobacco abuse - Counseled regarding cessation by admitting hospitalist    DVT prophylaxis: Heparin  subcutaneous Code Status: Full Family Communication: None at bedside Disposition Plan: Status is: Inpatient Remains inpatient appropriate because: Of severity of illness.  Need for SNF placement.    Consultants: None  Procedures: None  Antimicrobials: None   Subjective: Patient seen and examined at bedside.  Still having intermittent upper back pain. Denies neck pain. Pain medications are helping.  No fever or vomiting reported.  Objective: Vitals:   03/11/24 0947 03/11/24 1323 03/11/24 1945 03/12/24 0500  BP: 134/76 (!) 169/88 138/89 (!) 111/93  Pulse: 94 (!) 102 93 79  Resp: 19 20 20 20   Temp: 97.8 F (36.6 C) 98.6 F (37 C) 98.1 F (36.7 C)   TempSrc: Oral Oral Oral   SpO2: 95% 94% 91%     Intake/Output Summary (Last 24 hours) at 03/12/2024 1324 Last data filed at 03/11/2024 2355 Gross per 24 hour  Intake --  Output 500 ml  Net -500 ml   There were no vitals filed for this visit.  Examination:  General: No acute distress.  Remains on room air  ENT/neck: No neck masses or JVD elevation noted  respiratory: Bilateral decreased breath sounds at bases with scattered crackles CVS: Rate mostly controlled; S1 and S2 are heard Abdominal: Soft, nontender, distended mildly; no organomegaly, bowel sounds are heard normally Extremities: No clubbing; mild lower extremity edema present CNS: Awake but remains slow to respond.  No focal neurologic deficit.  Able to move  extremities Lymph: No palpable lymphadenopathy Skin: No obvious petechia/rashes psych: Most flat affect.  Not agitated currently.   Musculoskeletal: No obvious joint tenderness/erythema   Data Reviewed: I have personally reviewed following labs and imaging  studies  CBC: Recent Labs  Lab 03/09/24 1103 03/10/24 0553  WBC 9.0 7.2  NEUTROABS 6.2  --   HGB 16.2* 16.0*  HCT 49.7* 50.7*  MCV 89.4 91.4  PLT 256 268   Basic Metabolic Panel: Recent Labs  Lab 03/09/24 1103 03/10/24 0553 03/11/24 0517  NA 132* 137 137  K 4.7 4.5 4.8  CL 99 98 104  CO2 19* 25 22  GLUCOSE 96 129* 102*  BUN 102* 108* 80*  CREATININE 3.19* 2.29* 1.64*  CALCIUM  8.7* 9.6 8.8*  MG  --   --  2.6*   GFR: CrCl cannot be calculated (Unknown ideal weight.). Liver Function Tests: Recent Labs  Lab 03/09/24 1103  AST 20  ALT 14  ALKPHOS 64  BILITOT 0.6  PROT 7.4  ALBUMIN 3.6   Recent Labs  Lab 03/09/24 1103  LIPASE 44   No results for input(s): "AMMONIA" in the last 168 hours. Coagulation Profile: No results for input(s): "INR", "PROTIME" in the last 168 hours. Cardiac Enzymes: No results for input(s): "CKTOTAL", "CKMB", "CKMBINDEX", "TROPONINI" in the last 168 hours. BNP (last 3 results) No results for input(s): "PROBNP" in the last 8760 hours. HbA1C: No results for input(s): "HGBA1C" in the last 72 hours. CBG: No results for input(s): "GLUCAP" in the last 168 hours. Lipid Profile: No results for input(s): "CHOL", "HDL", "LDLCALC", "TRIG", "CHOLHDL", "LDLDIRECT" in the last 72 hours. Thyroid Function Tests: No results for input(s): "TSH", "T4TOTAL", "FREET4", "T3FREE", "THYROIDAB" in the last 72 hours. Anemia Panel: No results for input(s): "VITAMINB12", "FOLATE", "FERRITIN", "TIBC", "IRON", "RETICCTPCT" in the last 72 hours. Sepsis Labs: No results for input(s): "PROCALCITON", "LATICACIDVEN" in the last 168 hours.  Recent Results (from the past 240 hours)  Urine Culture (for pregnant, neutropenic or urologic patients or patients with an indwelling urinary catheter)     Status: None   Collection Time: 03/09/24 12:55 PM   Specimen: Urine, Clean Catch  Result Value Ref Range Status   Specimen Description   Final    URINE, CLEAN  CATCH Performed at [facility], [address] [place], [place] [address]    Special Requests   Final    NONE Performed at [facility], [address], [place], [place] [address]    Culture   Final    NO GROWTH Performed at [facility], [address], [place], [place] [address]    Report Status 03/10/2024 FINAL  Final         Radiology Studies: MR THORACIC SPINE WO CONTRAST Result Date: 03/11/2024 CLINICAL DATA:  Mid back pain.  Spondyloarthropathy suspected EXAM: MRI THORACIC SPINE WITHOUT CONTRAST TECHNIQUE: Multiplanar, multisequence MR imaging of the thoracic spine was performed. No intravenous contrast was administered. COMPARISON:  CT chest once scratched at CT chest 06/03/2023 FINDINGS: Alignment: No significant listhesis is present. Mildly exaggerated thoracic kyphosis is again noted. Rightward curvature is centered at T7. Vertebrae: Marrow signal and vertebral body heights are normal. 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 Cord signal and morphology is otherwise within normal limits. The conus medullaris terminates at L1. Paraspinal and other soft tissues: The paraspinous soft tissues are within normal limits. Visualized upper abdomen  is unremarkable. Disc levels: Severe central canal stenosis is present at C5-6. Moderate central canal narrowing is present at C6-7. The thoracic disc levels at T4-5 and above are within normal limits. T5-6: A shallow central disc protrusion is present. No focal stenosis is present. The foramina are patent bilaterally. T6-7: A leftward disc protrusion is present. Mild left foraminal narrowing is present. T7-8: A leftward disc protrusion is present. Mild left foraminal narrowing is present. T8-9: A leftward disc protrusion and asymmetric left-sided facet hypertrophy contribute to moderate left foraminal  stenosis. The central canal is patent. T9-10: Mild facet hypertrophy is noted bilaterally. No focal disc protrusion or stenosis is present. T10-11: Mild right-sided facet hypertrophy is present without significant stenosis. The disc levels at T11-12 and T12-L1 are within normal limits. IMPRESSION: 1. 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 2. Moderate central canal narrowing at C6-7. 3. Mild left foraminal narrowing at T6-7 and T7-8. 4. Moderate left foraminal stenosis at T8-9. 5. Mild facet hypertrophy bilaterally at T9-10 and on the right at T10-11 without significant stenosis. Electronically Signed   By: Audree Leas M.D.   On: 03/11/2024 19:11        Scheduled Meds:  acetaminophen   1,000 mg Oral Q8H   amLODipine   10 mg Oral Daily   atorvastatin   20 mg Oral Daily   heparin   5,000 Units Subcutaneous Q8H   hydrALAZINE   25 mg Oral Q8H   lidocaine   2 patch Transdermal Q24H   pantoprazole   40 mg Oral BID   sodium chloride  flush  3 mL Intravenous Q12H   Continuous Infusions:          Audria Leather, MD [facility] 03/12/2024, 7:42 AM

## 2024-03-13 DIAGNOSIS — N1832 Chronic kidney disease, stage 3b: Secondary | ICD-10-CM | POA: Diagnosis not present

## 2024-03-13 DIAGNOSIS — N179 Acute kidney failure, unspecified: Secondary | ICD-10-CM | POA: Diagnosis not present

## 2024-03-13 LAB — BASIC METABOLIC PANEL WITH GFR
Anion gap: 7 (ref 5–15)
BUN: 51 mg/dL — ABNORMAL HIGH (ref 8–23)
CO2: 25 mmol/L (ref 22–32)
Calcium: 8.7 mg/dL — ABNORMAL LOW (ref 8.9–10.3)
Chloride: 101 mmol/L (ref 98–111)
Creatinine, Ser: 1.35 mg/dL — ABNORMAL HIGH (ref 0.44–1.00)
GFR, Estimated: 43 mL/min — ABNORMAL LOW (ref >60)
Glucose, Bld: 110 mg/dL — ABNORMAL HIGH (ref 70–99)
Potassium: 4.8 mmol/L (ref 3.5–5.1)
Sodium: 133 mmol/L — ABNORMAL LOW (ref 135–145)

## 2024-03-13 LAB — MAGNESIUM: Magnesium: 2.4 mg/dL (ref 1.7–2.4)

## 2024-03-13 MED ORDER — MAGNESIUM CITRATE PO SOLN
1.0000 | Freq: Once | ORAL | Status: AC
  Administered 2024-03-13: 1 via ORAL
  Filled 2024-03-13: qty 296

## 2024-03-13 MED ORDER — SENNOSIDES-DOCUSATE SODIUM 8.6-50 MG PO TABS
2.0000 | ORAL_TABLET | Freq: Two times a day (BID) | ORAL | Status: DC
  Administered 2024-03-13 – 2024-03-16 (×7): 2 via ORAL
  Filled 2024-03-13 (×7): qty 2

## 2024-03-13 NOTE — Progress Notes (Signed)
 Physical Therapy Treatment Patient Details Name: Shari Obrien MRN: [id number] DOB: Sep 11, 1956 Today's Date: 03/13/2024   History of Present Illness 68 y.o. female presented with worsening left-sided back pain.WUJ:[id number], diastolic CHF, COPD, diabetes mellitus type 2, uterine fibroids s/p abdominal hysterectomy, and chronic kidney disease .  CT renal study showed 3 x 5 mm nonobstructing calculus in the right kidney. Lumbar OZH:YQMVHQIONG degenerative changes of the lumbar spine with prominent  bilateral facet arthropathy, with varying degrees of spinal canal  stenosis and neural foraminal narrowing . MRI of thoracic spine pending    PT Comments  PT - Cognition Comments: Pt AxO x 3 sleepy/groggy requiring MAX encouragement to participate.  Self limiting.  Not at all interested in getting OOB but eventually complied.  General bed mobility comments: "Don't touch me" repeated Pt.  "I can do it".  HOB elevated and use of rail, pt was able to transition from supine to EOB however required 8 min to complete.  "I'm coming" and "I can't move fast", repeated Pt. General transfer comment: Pt able to rise from elevated bed by pulling self on walker.  Again stating, "don't touch me" and increased time.  Nearly 10 min to complete.  Slow/stalling.  C/O neck/back pain.  "Why are you making me get up when I hurt?" asked Pt. General Gait Details: limited amb distance 5 feet with walker.  Slow/sluggish gait.  Poor forward flexed posture.  Recliner following for safety. Left pt in recliner with tray table in front.  Pt lives home alone in an apartment.  Per LPT, Pt will need ST Rehab at SNF to address mobility and functional decline prior to safely returning home. Pt would benefit from a K PAD (heating pad) while here.  Reported to RN.    If plan is discharge home, recommend the following: Two people to help with walking and/or transfers;A lot of help with bathing/dressing/bathroom;Assistance with  cooking/housework;Assist for transportation;Help with stairs or ramp for entrance   Can travel by private vehicle     No  Equipment Recommendations  None recommended by PT    Recommendations for Other Services       Precautions / Restrictions Precautions Precautions: Fall Precaution/Restrictions Comments: significant neck and back pain Restrictions Weight Bearing Restrictions Per Provider Order: No     Mobility  Bed Mobility Overal bed mobility: Needs Assistance Bed Mobility: Supine to Sit     Supine to sit: Supervision, Contact guard     General bed mobility comments: "Don't touch me" repeated Pt.  "I can do it".  HOB elevated and use of rail, pt was able to transition from supine to EOB however required 8 min to complete.  "I'm coming" and "I can't move fast", repeated Pt.    Transfers Overall transfer level: Needs assistance Equipment used: Rolling walker (2 wheels) Transfers: Sit to/from Stand Sit to Stand: Min assist, +2 physical assistance, +2 safety/equipment           General transfer comment: Pt able to rise from elevated bed by pulling self on walker.  Again stating, "don't touch me" and increased time.  Nearly 10 min to complete.  Slow/stalling.  C/O neck/back pain.  "Why are you making me get up when I hurt?" asked Pt.    Ambulation/Gait Ambulation/Gait assistance: Min assist, +2 physical assistance, +2 safety/equipment Gait Distance (Feet): 5 Feet Assistive device: Rolling walker (2 wheels) Gait Pattern/deviations: Step-to pattern, Decreased step length - right, Decreased step length - left Gait velocity: decreased  General Gait Details: limited amb distance 5 feet with walker.  Slow/sluggish gait.  Poor forward flexed posture.  Recliner following for safety.   Stairs             Wheelchair Mobility     Tilt Bed    Modified Rankin (Stroke Patients Only)       Balance                                             Communication Communication Communication: No apparent difficulties  Cognition   Behavior During Therapy: Flat affect   PT - Cognitive impairments: No apparent impairments                       PT - Cognition Comments: Pt AxO x 3 sleepy/groggy requiring MAX encouragement to participate.  Self limiting.  Not at all interested in getting OOB. Following commands: Intact      Cueing Cueing Techniques: Verbal cues  Exercises      General Comments        Pertinent Vitals/Pain Pain Assessment Pain Assessment: Faces Faces Pain Scale: Hurts little more Pain Location: neck and back Pain Descriptors / Indicators: Grimacing, Cramping, Guarding Pain Intervention(s): Monitored during session, Repositioned    Home Living                          Prior Function            PT Goals (current goals can now be found in the care plan section) Progress towards PT goals: Progressing toward goals    Frequency    Min 2X/week      PT Plan      Co-evaluation              AM-PAC PT "6 Clicks" Mobility   Outcome Measure  Help needed turning from your back to your side while in a flat bed without using bedrails?: A Lot Help needed moving from lying on your back to sitting on the side of a flat bed without using bedrails?: A Lot Help needed moving to and from a bed to a chair (including a wheelchair)?: A Lot Help needed standing up from a chair using your arms (e.g., wheelchair or bedside chair)?: A Lot Help needed to walk in hospital room?: A Lot Help needed climbing 3-5 steps with a railing? : Total 6 Click Score: 11    End of Session Equipment Utilized During Treatment: Gait belt Activity Tolerance: Patient limited by pain;Other (comment) (Pt self limited) Patient left: in chair;with call bell/phone within reach Nurse Communication: Mobility status PT Visit Diagnosis: Unsteadiness on feet (R26.81);Muscle weakness (generalized) (M62.81);Pain      Time: 1100-1128 PT Time Calculation (min) (ACUTE ONLY): 28 min  Charges:    $Gait Training: 8-22 mins $Therapeutic Activity: 8-22 mins PT General Charges $$ ACUTE PT VISIT: 1 Visit                    Bess Broody  PTA Acute  Rehabilitation Services Office M-F          [phone]

## 2024-03-13 NOTE — Plan of Care (Signed)

## 2024-03-13 NOTE — TOC Progression Note (Signed)
 Transition of Care [facility]) - Progression Note    Patient Details  Name: Shari Obrien MRN: [id number] Date of Birth: 1956-07-31  Transition of Care [facility]) CM/SW Contact  Kathryn Parish, RN Phone Number: 03/13/2024, 10:40 AM  Clinical Narrative:    Choice List Given. See below    Expected Discharge Plan: Skilled Nursing Facility Barriers to Discharge: Continued Medical Work up  Expected Discharge Plan and Services In-house Referral: NA     Living arrangements for the past 2 months: Apartment                 DME Arranged: N/A DME Agency: NA       HH Arranged: NA HH Agency: NA         Social Determinants of Health (SDOH) Interventions SDOH Screenings   Food Insecurity: No Food Insecurity (03/09/2024)  Housing: Low Risk  (03/09/2024)  Transportation Needs: No Transportation Needs (03/09/2024)  Utilities: Not At Risk (03/09/2024)  Depression (PHQ2-9): High Risk (08/04/2021)  Social Connections: Unknown (03/09/2024)  Tobacco Use: High Risk (03/09/2024)    Readmission Risk Interventions    03/10/2024    2:39 PM  Readmission Risk Prevention Plan  Post Dischage Appt Complete  Medication Screening Complete  Transportation Screening Complete

## 2024-03-13 NOTE — Progress Notes (Signed)
 PROGRESS NOTE    Shari Obrien  WGN:[id number] DOB: 04-Jun-1956 DOA: 03/09/2024 PCP: Lorella Roles, MD   Brief Narrative:  68 y.o. female with medical history significant of hypertension, diastolic CHF, COPD, diabetes mellitus type 2, uterine fibroids s/p abdominal hysterectomy, and chronic kidney disease stage IIIb presented with worsening left-sided back pain.  On presentation, creatinine was 3.19.  UA was negative for leukocytes or nitrites but showed multiple bacteria.  CT renal study showed 3 x 5 mm nonobstructing calculus in the right kidney lower pole calyx with no other stones or obstructive uropathy.  She was started on IV fluids and analgesics.  Subsequently, renal function has improved.  MRI of thoracic spine showed severe canal stenosis at C5-C6.  Neurosurgery recommended outpatient follow-up.  PT recommended SNF placement.  TOC consulted.  Assessment & Plan:   Acute kidney injury on CKD stage IIIb Acute metabolic acidosis: Resolved - Creatinine was 1.53 in 07/2023.  Presented with creatinine of 3.19.  Possibly from recent excessive NSAID use along with combination of diuretics and lisinopril .  CT renal study as above. - Creatinine improving to 1.35 today.  Encourage oral intake.  Off IV fluids.  Monitor creatinine  Left flank/upper back pain - Acute.  Ongoing for 2 weeks.  Renal CT did not show any cause for the patient's symptoms.  MRI of thoracic spine showed severe canal stenosis at C5-C6 with prominent disc protrusion; moderate central canal narrowing at C6-7; moderate left foraminal stenosis at T8-9.  Spoke to Dr. Verona Goodwill on phone on 03/12/2024: Recommended MRI of cervical spine and outpatient follow-up with neurosurgery.  MRI of cervical spine showed no evidence of discitis or osteomyelitis; multilevel spondylosis with most advanced at C5-6 with moderate spinal stenosis.  Outpatient follow-up with neurology - Continue current pain management regimen - PT  recommending SNF placement.  TOC consulted.  Hyponatremia - Mild.  Encourage oral intake.  Chronic diastolic heart failure - Currently euvolemic.  Continue strict input output.  Daily weights.  Outpatient follow-up with PCP and/or cardiology.  Last echocardiogram was in 2017 which had shown EF of 65 to 70% with grade 2 diastolic dysfunction.  Diuretics and lisinopril  on hold.  Essential hypertension Hyperlipidemia -Continue amlodipine  and statin.  Lisinopril , Aldactone  and Lasix  added.  Monitor blood pressure.  Oral hydralazine  has been started on 03/10/2024.  Nephrolithiasis - Outpatient follow-up with urology as needed  Tobacco abuse - Counseled regarding cessation by admitting hospitalist  Constipation - Continue bowel regimen    DVT prophylaxis: Heparin  subcutaneous Code Status: Full Family Communication: None at bedside Disposition Plan: Status is: Inpatient Remains inpatient appropriate because: Of severity of illness.  Need for SNF placement.    Consultants: None  Procedures: None  Antimicrobials: None   Subjective: Patient seen and examined at bedside.  Complains of constipation and upper back pain.  No fever or vomiting reported.   Objective: Vitals:   03/12/24 1958 03/12/24 2102 03/13/24 0511 03/13/24 0521  BP: (!) 155/81 (!) 155/81 132/80 132/80  Pulse: 93  83   Resp: 20  19   Temp: 98.3 F (36.8 C)  98.2 F (36.8 C)   TempSrc: Oral  Oral   SpO2: 91%  94%   Weight:      Height:        Intake/Output Summary (Last 24 hours) at 03/13/2024 0811 Last data filed at 03/13/2024 0800 Gross per 24 hour  Intake 480 ml  Output 1100 ml  Net -620 ml   [employer]  03/12/24 1712  Weight: 109.7 kg    Examination:  General: On room air currently.  No distress.  ENT/neck: No obvious JVD elevation or palpable neck masses noted respiratory: Decreased breath sounds at bases bilaterally with scattered crackles  CVS: S1-S2 heard; currently rate controlled   abdominal: Soft, nontender, distended slightly; no organomegaly, normal bowel sounds are heard  extremities: Trace lower extremity edema present; no cyanosis  CNS: Awake; remains slow to respond.  No obvious focal neurologic deficits noted  Lymph: No lymphadenopathy palpable  skin: No obvious ecchymosis/lesions  psych: Showing no signs of agitation currently.  Flat affect mostly.   Musculoskeletal: No obvious joint swelling/erythema  Data Reviewed: I have personally reviewed following labs and imaging studies  CBC: Recent Labs  Lab 03/09/24 1103 03/10/24 0553  WBC 9.0 7.2  NEUTROABS 6.2  --   HGB 16.2* 16.0*  HCT 49.7* 50.7*  MCV 89.4 91.4  PLT 256 268   Basic Metabolic Panel: Recent Labs  Lab 03/09/24 1103 03/10/24 0553 03/11/24 0517 03/13/24 0437  NA 132* 137 137 133*  K 4.7 4.5 4.8 4.8  CL 99 98 104 101  CO2 19* 25 22 25   GLUCOSE 96 129* 102* 110*  BUN 102* 108* 80* 51*  CREATININE 3.19* 2.29* 1.64* 1.35*  CALCIUM  8.7* 9.6 8.8* 8.7*  MG  --   --  2.6* 2.4   GFR: Estimated Creatinine Clearance: 47.6 mL/min (A) (by C-G formula based on SCr of 1.35 mg/dL (H)). Liver Function Tests: Recent Labs  Lab 03/09/24 1103  AST 20  ALT 14  ALKPHOS 64  BILITOT 0.6  PROT 7.4  ALBUMIN 3.6   Recent Labs  Lab 03/09/24 1103  LIPASE 44   No results for input(s): "AMMONIA" in the last 168 hours. Coagulation Profile: No results for input(s): "INR", "PROTIME" in the last 168 hours. Cardiac Enzymes: No results for input(s): "CKTOTAL", "CKMB", "CKMBINDEX", "TROPONINI" in the last 168 hours. BNP (last 3 results) No results for input(s): "PROBNP" in the last 8760 hours. HbA1C: No results for input(s): "HGBA1C" in the last 72 hours. CBG: No results for input(s): "GLUCAP" in the last 168 hours. Lipid Profile: No results for input(s): "CHOL", "HDL", "LDLCALC", "TRIG", "CHOLHDL", "LDLDIRECT" in the last 72 hours. Thyroid Function Tests: No results for input(s): "TSH",  "T4TOTAL", "FREET4", "T3FREE", "THYROIDAB" in the last 72 hours. Anemia Panel: No results for input(s): "VITAMINB12", "FOLATE", "FERRITIN", "TIBC", "IRON", "RETICCTPCT" in the last 72 hours. Sepsis Labs: No results for input(s): "PROCALCITON", "LATICACIDVEN" in the last 168 hours.  Recent Results (from the past 240 hours)  Urine Culture (for pregnant, neutropenic or urologic patients or patients with an indwelling urinary catheter)     Status: None   Collection Time: 03/09/24 12:55 PM   Specimen: Urine, Clean Catch  Result Value Ref Range Status   Specimen Description   Final    URINE, CLEAN CATCH Performed at [facility], [address] [place], [place] [address]    Special Requests   Final    NONE Performed at [facility], [address] [place], [place] [address]    Culture   Final    NO GROWTH Performed at [facility], [address] [place], [place] [address]    Report Status 03/10/2024 FINAL  Final         Radiology Studies: MR CERVICAL SPINE WO CONTRAST Result Date: 03/12/2024 CLINICAL DATA:  Cervical radiculopathy, infection suspected, no prior imaging Abnormal finding on recent  T-spine MRI. EXAM: MRI CERVICAL SPINE WITHOUT CONTRAST TECHNIQUE: Multiplanar, multisequence MR imaging of the cervical spine was performed. No intravenous contrast was administered. COMPARISON:  MRI of the thoracic spine 03/11/2024. Chest CT 06/03/2023. FINDINGS: Technical note: Despite efforts by the technologist and patient, mild-to-moderate motion artifact is present on today's exam and could not be eliminated. This reduces exam sensitivity and specificity. Alignment: Straightening with minimal anterolisthesis at C4-5. No focal angulation. Vertebrae: No evidence of acute fracture or traumatic subluxation. No evidence of discitis or osteomyelitis. Cord: Mild cord flattening at C4-5, C5-6 and C6-7. Possible mild myelomalacia at C5. Posterior Fossa,  vertebral arteries, paraspinal tissues: Visualized portions of the posterior fossa appear unremarkable.Bilateral vertebral artery flow voids. No significant paraspinal findings. Disc levels: C2-3: Preserved disc height. No significant spinal stenosis or foraminal narrowing. C3-4: Preserved disc height with mild uncinate spurring and facet hypertrophy. No spinal stenosis or significant foraminal narrowing. C4-5: Mild loss of disc height with a left paracentral disc protrusion, uncinate spurring and bilateral facet hypertrophy. Resulting mild spinal stenosis with mild cord flattening and mild to moderate foraminal narrowing bilaterally. C5-6: Spondylosis with loss of disc height and posterior osteophytes covering diffusely bulging disc material. Moderate spinal stenosis with cord flattening and moderate to severe foraminal narrowing bilaterally. Superimposed possible ossification of the posterior longitudinal ligament extending inferiorly to the C6-7 disc space level. C6-7: Spondylosis with loss of disc height and posterior osteophytes covering diffusely bulging disc material. Mild left-sided cord flattening with moderate left foraminal narrowing. The right foramen appears adequately patent. C7-T1: No significant findings. IMPRESSION: 1. Mild to moderate motion artifact. 2. No evidence of discitis or osteomyelitis. 3. Multilevel cervical spondylosis, most advanced at C5-6 where there is moderate spinal stenosis with cord flattening and moderate to severe foraminal narrowing bilaterally. 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 This could be confirmed with CT of the cervical spine, especially if surgical intervention is contemplated. 4. Mild spinal stenosis with mild cord flattening and mild to moderate foraminal narrowing bilaterally at C4-5. 5. Mild left-sided cord flattening with moderate left foraminal narrowing at C6-7. 6. Possible mild  myelomalacia at C5. Electronically Signed   By: Elmon Hagedorn M.D.   On: 03/12/2024 17:45   MR THORACIC SPINE WO CONTRAST Result Date: 03/11/2024 CLINICAL DATA:  Mid back pain.  Spondyloarthropathy suspected EXAM: MRI THORACIC SPINE WITHOUT CONTRAST TECHNIQUE: Multiplanar, multisequence MR imaging of the thoracic spine was performed. No intravenous contrast was administered. COMPARISON:  CT chest once scratched at CT chest 06/03/2023 FINDINGS: Alignment: No significant listhesis is present. Mildly exaggerated thoracic kyphosis is again noted. Rightward curvature is centered at T7. Vertebrae: Marrow signal and vertebral body heights are normal. 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 Cord signal and morphology is otherwise within normal limits. The conus medullaris terminates at L1. Paraspinal and other soft tissues: The paraspinous soft tissues are within normal limits. Visualized upper abdomen is unremarkable. Disc levels: Severe central canal stenosis is present at C5-6. Moderate central canal narrowing is present at C6-7. The thoracic disc levels at T4-5 and above are within normal limits. T5-6: A shallow central disc protrusion is present. No focal stenosis is present. The foramina are patent bilaterally. T6-7: A leftward disc protrusion is present. Mild left foraminal narrowing is present. T7-8: A leftward disc protrusion is present. Mild left foraminal narrowing is present. T8-9: A leftward disc protrusion and  asymmetric left-sided facet hypertrophy contribute to moderate left foraminal stenosis. The central canal is patent. T9-10: Mild facet hypertrophy is noted bilaterally. No focal disc protrusion or stenosis is present. T10-11: Mild right-sided facet hypertrophy is present without significant stenosis. The disc levels at T11-12 and T12-L1 are within normal limits. IMPRESSION: 1. 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 2. Moderate central canal narrowing at C6-7. 3. Mild left foraminal narrowing at T6-7 and T7-8. 4. Moderate left foraminal stenosis at T8-9. 5. Mild facet hypertrophy bilaterally at T9-10 and on the right at T10-11 without significant stenosis. Electronically Signed   By: Audree Leas M.D.   On: 03/11/2024 19:11        Scheduled Meds:  acetaminophen   1,000 mg Oral Q8H   amLODipine   10 mg Oral Daily   atorvastatin   20 mg Oral Daily   heparin   5,000 Units Subcutaneous Q8H   hydrALAZINE   25 mg Oral Q8H   lidocaine   2 patch Transdermal Q24H   magnesium  citrate  1 Bottle Oral Once   pantoprazole   40 mg Oral BID   polyethylene glycol  17 g Oral Daily   senna-docusate  2 tablet Oral BID   sodium chloride  flush  3 mL Intravenous Q12H   Continuous Infusions:          Audria Leather, MD [facility] 03/13/2024, 8:11 AM

## 2024-03-14 DIAGNOSIS — N179 Acute kidney failure, unspecified: Secondary | ICD-10-CM | POA: Diagnosis not present

## 2024-03-14 DIAGNOSIS — N1832 Chronic kidney disease, stage 3b: Secondary | ICD-10-CM | POA: Diagnosis not present

## 2024-03-14 LAB — CBC WITH DIFFERENTIAL/PLATELET
Abs Immature Granulocytes: 0.03 10*3/uL (ref 0.00–0.07)
Basophils Absolute: 0.1 10*3/uL (ref 0.0–0.1)
Basophils Relative: 1 %
Eosinophils Absolute: 0.1 10*3/uL (ref 0.0–0.5)
Eosinophils Relative: 1 %
HCT: 47.5 % — ABNORMAL HIGH (ref 36.0–46.0)
Hemoglobin: 14.8 g/dL (ref 12.0–15.0)
Immature Granulocytes: 0 %
Lymphocytes Relative: 34 %
Lymphs Abs: 2.6 10*3/uL (ref 0.7–4.0)
MCH: 28.8 pg (ref 26.0–34.0)
MCHC: 31.2 g/dL (ref 30.0–36.0)
MCV: 92.6 fL (ref 80.0–100.0)
Monocytes Absolute: 0.9 10*3/uL (ref 0.1–1.0)
Monocytes Relative: 12 %
Neutro Abs: 4 10*3/uL (ref 1.7–7.7)
Neutrophils Relative %: 52 %
Platelets: 280 10*3/uL (ref 150–400)
RBC: 5.13 MIL/uL — ABNORMAL HIGH (ref 3.87–5.11)
RDW: 14 % (ref 11.5–15.5)
WBC: 7.6 10*3/uL (ref 4.0–10.5)
nRBC: 0 % (ref 0.0–0.2)

## 2024-03-14 LAB — BASIC METABOLIC PANEL WITH GFR
Anion gap: 10 (ref 5–15)
BUN: 38 mg/dL — ABNORMAL HIGH (ref 8–23)
CO2: 27 mmol/L (ref 22–32)
Calcium: 9.2 mg/dL (ref 8.9–10.3)
Chloride: 102 mmol/L (ref 98–111)
Creatinine, Ser: 1.31 mg/dL — ABNORMAL HIGH (ref 0.44–1.00)
GFR, Estimated: 44 mL/min — ABNORMAL LOW (ref >60)
Glucose, Bld: 108 mg/dL — ABNORMAL HIGH (ref 70–99)
Potassium: 5.3 mmol/L — ABNORMAL HIGH (ref 3.5–5.1)
Sodium: 139 mmol/L (ref 135–145)

## 2024-03-14 LAB — MAGNESIUM: Magnesium: 2.4 mg/dL (ref 1.7–2.4)

## 2024-03-14 MED ORDER — POLYETHYLENE GLYCOL 3350 17 G PO PACK
17.0000 g | PACK | Freq: Two times a day (BID) | ORAL | Status: DC
  Administered 2024-03-14 – 2024-03-16 (×4): 17 g via ORAL
  Filled 2024-03-14 (×4): qty 1

## 2024-03-14 MED ORDER — SMOG ENEMA
960.0000 mL | Freq: Once | RECTAL | Status: DC
  Filled 2024-03-14: qty 960

## 2024-03-14 MED ORDER — BISACODYL 10 MG RE SUPP: 10.0000 mg | Freq: Once | RECTAL | Status: DC

## 2024-03-14 NOTE — Progress Notes (Addendum)
 PROGRESS NOTE    Shari Obrien  ZOX:[id number] DOB: [birth year] DOA: 03/09/2024 PCP: Lorella Roles, MD    Brief Narrative:  68 y.o. female with medical history significant of HTN, diastolic CHF, COPD, DM type 2, uterine fibroids s/p abdominal hysterectomy, CKD stage IIIb presented with worsening left-sided back pain.  On presentation, creatinine was 3.19. UA was negative for leukocytes or nitrites but showed multiple bacteria.  CT renal study showed 3 x 5 mm nonobstructing calculus in the right kidney lower pole calyx with no other stones or obstructive uropathy.  She was started on IV fluids and analgesics.  Subsequently, renal function has improved.  MRI of thoracic spine showed severe canal stenosis at C5-C6.  Neurosurgery recommended outpatient follow-up.  PT recommended SNF placement.  TOC consulted.  Assessment & Plan:   Acute kidney injury on CKD stage IIIb Creatinine currently 1.31 s/p IV fluids Presented with creatinine of 3.19.  Possibly from recent excessive NSAID use along with combination of diuretics and lisinopril  CT renal study as above Daily BMP  Left flank/upper back pain Ongoing for 2 weeks MRI of thoracic spine showed severe canal stenosis at C5-C6 with prominent disc protrusion; moderate central canal narrowing at C6-7; moderate left foraminal stenosis at T8-9 Prior attending discussed with Dr. Verona Goodwill on phone on 03/12/2024: Recommended MRI of cervical spine and outpatient follow-up with neurosurgery.  MRI of cervical spine showed no evidence of discitis or osteomyelitis; multilevel spondylosis with most advanced at C5-6 with moderate spinal stenosis.  Outpatient follow-up with neurology Continue current pain management regimen PT recommending SNF placement.  TOC consulted  Mild hyperkalemia BMP in a.m.  Chronic diastolic heart failure Last echocardiogram was in 2017 which had shown EF of 65 to 70% with grade 2 diastolic dysfunction, appears  stable Diuretics and lisinopril  on hold Outpatient follow-up with PCP and/or cardiology  Essential hypertension Hyperlipidemia Continue amlodipine , hydralazine , statin Lisinopril , Aldactone  and Lasix  held  Nephrolithiasis Outpatient follow-up with urology as needed  Tobacco abuse Counseled regarding cessation by admitting hospitalist  Constipation Continue bowel regimen    DVT prophylaxis: Heparin  subcutaneous Code Status: Full Family Communication: None at bedside Disposition Plan: Status is: Inpatient Remains inpatient appropriate because: Of severity of illness.  Need for SNF placement.    Consultants: None  Procedures: None  Antimicrobials: None   Subjective: Today, patient denies any new complaints, complaints of constipation this morning.  Later today, RN reported having a small BM.    Objective: Vitals:   03/13/24 1937 03/14/24 0417 03/14/24 1016 03/14/24 1300  BP: (!) 130/97 114/70 122/69 130/71  Pulse: 93 80 76 78  Resp: 18 18  18   Temp: 99.3 F (37.4 C) 98.6 F (37 C)  98.3 F (36.8 C)  TempSrc: Oral   Oral  SpO2: 91% 94%  95%  Weight:      Height:        Intake/Output Summary (Last 24 hours) at 03/14/2024 1651 Last data filed at 03/14/2024 1400 Gross per 24 hour  Intake 360 ml  Output --  Net 360 ml   Filed Weights   03/12/24 1712  Weight: 109.7 kg    Examination: General: NAD  Cardiovascular: S1, S2 present Respiratory: CTAB Abdomen: Soft, nontender, nondistended, bowel sounds present Musculoskeletal: Trace bilateral pedal edema noted Skin: Normal Psychiatry: Normal mood    Data Reviewed: I have personally reviewed following labs and imaging studies  CBC: Recent Labs  Lab 03/09/24 1103 03/10/24 0553 03/14/24 0442  WBC 9.0 7.2 7.6  NEUTROABS  6.2  --  4.0  HGB 16.2* 16.0* 14.8  HCT 49.7* 50.7* 47.5*  MCV 89.4 91.4 92.6  PLT 256 268 280   Basic Metabolic Panel: Recent Labs  Lab 03/09/24 1103 03/10/24 0553  03/11/24 0517 03/13/24 0437 03/14/24 0442  NA 132* 137 137 133* 139  K 4.7 4.5 4.8 4.8 5.3*  CL 99 98 104 101 102  CO2 19* 25 22 25 27   GLUCOSE 96 129* 102* 110* 108*  BUN 102* 108* 80* 51* 38*  CREATININE 3.19* 2.29* 1.64* 1.35* 1.31*  CALCIUM  8.7* 9.6 8.8* 8.7* 9.2  MG  --   --  2.6* 2.4 2.4   GFR: Estimated Creatinine Clearance: 49.1 mL/min (A) (by C-G formula based on SCr of 1.31 mg/dL (H)). Liver Function Tests: Recent Labs  Lab 03/09/24 1103  AST 20  ALT 14  ALKPHOS 64  BILITOT 0.6  PROT 7.4  ALBUMIN 3.6   Recent Labs  Lab 03/09/24 1103  LIPASE 44   No results for input(s): "AMMONIA" in the last 168 hours. Coagulation Profile: No results for input(s): "INR", "PROTIME" in the last 168 hours. Cardiac Enzymes: No results for input(s): "CKTOTAL", "CKMB", "CKMBINDEX", "TROPONINI" in the last 168 hours. BNP (last 3 results) No results for input(s): "PROBNP" in the last 8760 hours. HbA1C: No results for input(s): "HGBA1C" in the last 72 hours. CBG: No results for input(s): "GLUCAP" in the last 168 hours. Lipid Profile: No results for input(s): "CHOL", "HDL", "LDLCALC", "TRIG", "CHOLHDL", "LDLDIRECT" in the last 72 hours. Thyroid Function Tests: No results for input(s): "TSH", "T4TOTAL", "FREET4", "T3FREE", "THYROIDAB" in the last 72 hours. Anemia Panel: No results for input(s): "VITAMINB12", "FOLATE", "FERRITIN", "TIBC", "IRON", "RETICCTPCT" in the last 72 hours. Sepsis Labs: No results for input(s): "PROCALCITON", "LATICACIDVEN" in the last 168 hours.  Recent Results (from the past 240 hours)  Urine Culture (for pregnant, neutropenic or urologic patients or patients with an indwelling urinary catheter)     Status: None   Collection Time: 03/09/24 12:55 PM   Specimen: Urine, Clean Catch  Result Value Ref Range Status   Specimen Description   Final    URINE, CLEAN CATCH Performed at [facility], [address] [place], [place] [address]     Special Requests   Final    NONE Performed at [facility], [address], [place], [place] [address]    Culture   Final    NO GROWTH Performed at [facility], [address] [place], [place] [address]    Report Status 03/10/2024 FINAL  Final         Radiology Studies: No results found.       Scheduled Meds:  acetaminophen   1,000 mg Oral Q8H   amLODipine   10 mg Oral Daily   atorvastatin   20 mg Oral Daily   heparin   5,000 Units Subcutaneous Q8H   hydrALAZINE   25 mg Oral Q8H   lidocaine   2 patch Transdermal Q24H   pantoprazole   40 mg Oral BID   polyethylene glycol  17 g Oral BID   senna-docusate  2 tablet Oral BID   sodium chloride  flush  3 mL Intravenous Q12H   Continuous Infusions:          Naysa Puskas J Tamla Winkels, MD [facility] 03/14/2024, 4:51 PM

## 2024-03-14 NOTE — TOC Progression Note (Signed)
 Transition of Care [facility]) - Progression Note    Patient Details  Name: Shari Obrien MRN: [id number] Date of Birth: [birth year]  [facility]) CM/SW Contact  Kathryn Parish, RN Phone Number: 03/14/2024, 3:40 PM  Clinical Narrative:     Siegfried Dress approved. plan # Certification Number [id number] Start Date - End Date 2024-03-14 - 2024-03-20 bed available at [facility]; pt will transported by [facility] once room #, and # for nursing report given tomorrow.  Expected Discharge Plan: Skilled Nursing Facility Barriers to Discharge: Continued Medical Work up  Expected Discharge Plan and Services In-house Referral: NA     Living arrangements for the past 2 months: Apartment                 DME Arranged: N/A DME Agency: NA       HH Arranged: NA HH Agency: NA         Social Determinants of Health (SDOH) Interventions SDOH Screenings   Food Insecurity: No Food Insecurity (03/09/2024)  Housing: Low Risk  (03/09/2024)  Transportation Needs: No Transportation Needs (03/09/2024)  Utilities: Not At Risk (03/09/2024)  Depression (PHQ2-9): High Risk (08/04/2021)  Social Connections: Unknown (03/09/2024)  Tobacco Use: High Risk (03/09/2024)    Readmission Risk Interventions    03/10/2024    2:39 PM  Readmission Risk Prevention Plan  Post Dischage Appt Complete  Medication Screening Complete  Transportation Screening Complete

## 2024-03-14 NOTE — Plan of Care (Signed)
  Problem: Education: Goal: Knowledge of General Education information will improve Description: Including pain rating scale, medication(s)/side effects and non-pharmacologic comfort measures Outcome: Progressing   Problem: Health Behavior/Discharge Planning: Goal: Ability to manage health-related needs will improve Outcome: Progressing   Problem: Clinical Measurements: Goal: Ability to maintain clinical measurements within normal limits will improve Outcome: Progressing   Problem: Clinical Measurements: Goal: Will remain free from infection Outcome: Progressing   Problem: Clinical Measurements: Goal: Diagnostic test results will improve Outcome: Progressing   Problem: Clinical Measurements: Goal: Respiratory complications will improve Outcome: Progressing   Problem: Clinical Measurements: Goal: Cardiovascular complication will be avoided Outcome: Progressing   Problem: Activity: Goal: Risk for activity intolerance will decrease Outcome: Progressing   Problem: Nutrition: Goal: Adequate nutrition will be maintained Outcome: Progressing   Problem: Elimination: Goal: Will not experience complications related to bowel motility Outcome: Progressing   Problem: Elimination: Goal: Will not experience complications related to urinary retention Outcome: Progressing   Problem: Pain Managment: Goal: General experience of comfort will improve and/or be controlled Outcome: Progressing   Problem: Safety: Goal: Ability to remain free from injury will improve Outcome: Progressing

## 2024-03-15 DIAGNOSIS — N179 Acute kidney failure, unspecified: Secondary | ICD-10-CM | POA: Diagnosis not present

## 2024-03-15 DIAGNOSIS — N1832 Chronic kidney disease, stage 3b: Secondary | ICD-10-CM | POA: Diagnosis not present

## 2024-03-15 LAB — BASIC METABOLIC PANEL WITH GFR
Anion gap: 8 (ref 5–15)
BUN: 37 mg/dL — ABNORMAL HIGH (ref 8–23)
CO2: 25 mmol/L (ref 22–32)
Calcium: 8.8 mg/dL — ABNORMAL LOW (ref 8.9–10.3)
Chloride: 100 mmol/L (ref 98–111)
Creatinine, Ser: 1.35 mg/dL — ABNORMAL HIGH (ref 0.44–1.00)
GFR, Estimated: 43 mL/min — ABNORMAL LOW (ref >60)
Glucose, Bld: 129 mg/dL — ABNORMAL HIGH (ref 70–99)
Potassium: 4.9 mmol/L (ref 3.5–5.1)
Sodium: 133 mmol/L — ABNORMAL LOW (ref 135–145)

## 2024-03-15 MED ORDER — TIZANIDINE HCL 2 MG PO TABS: 4.0000 mg | ORAL_TABLET | Freq: Three times a day (TID) | ORAL | Status: DC | PRN

## 2024-03-15 MED ORDER — MORPHINE SULFATE (PF) 2 MG/ML IV SOLN
1.0000 mg | Freq: Once | INTRAVENOUS | Status: AC
  Administered 2024-03-15: 1 mg via INTRAVENOUS
  Filled 2024-03-15: qty 1

## 2024-03-15 MED ORDER — DULOXETINE HCL 30 MG PO CPEP: 60.0000 mg | ORAL_CAPSULE | Freq: Every day | ORAL | Status: DC

## 2024-03-15 MED ORDER — METHOCARBAMOL 500 MG PO TABS
1000.0000 mg | ORAL_TABLET | Freq: Three times a day (TID) | ORAL | Status: DC
  Administered 2024-03-15 – 2024-03-16 (×4): 1000 mg via ORAL
  Filled 2024-03-15 (×4): qty 2

## 2024-03-15 MED ORDER — DULOXETINE HCL 30 MG PO CPEP
60.0000 mg | ORAL_CAPSULE | Freq: Every day | ORAL | Status: DC
  Administered 2024-03-15: 60 mg via ORAL
  Filled 2024-03-15: qty 2

## 2024-03-15 MED ORDER — HYDROMORPHONE HCL 1 MG/ML IJ SOLN
0.5000 mg | Freq: Once | INTRAMUSCULAR | Status: AC
  Administered 2024-03-15: 0.5 mg via INTRAVENOUS
  Filled 2024-03-15: qty 0.5

## 2024-03-15 MED ORDER — TIZANIDINE HCL 2 MG PO TABS
4.0000 mg | ORAL_TABLET | Freq: Four times a day (QID) | ORAL | Status: DC | PRN
  Administered 2024-03-16: 4 mg via ORAL
  Filled 2024-03-15: qty 2

## 2024-03-15 NOTE — Progress Notes (Addendum)
 Patient calls this RN back in room and scream "This pain is ridiculous and the pain medication isnt helping. I want to see a doctor now and they're going to have to do something about this pain". This RN states to patient that she received her pain medication at 0207 and it takes some time to kick in. Patient states "I dont care I will just leave and go home since they dont want to do anything". RN notified Lorre Rosin, NP. Care plan continued.

## 2024-03-15 NOTE — TOC Progression Note (Addendum)
 Transition of Care [facility]) - Progression Note    Patient Details  Name: Shari Obrien MRN: [id number] Date of Birth: 09/11/1956  Transition [facility]) CM/SW Contact  Kathryn Parish, RN Phone Number: 03/15/2024, 10:48 AM  Clinical Narrative:    Patients pain not controlled. Lenore Rafter MD in chat said discharge tomorrow due to pain. 11:19 AM [facility] updated on patient staues.   Expected Discharge Plan: Skilled Nursing Facility Barriers to Discharge: Continued Medical Work up  Expected Discharge Plan and Services In-house Referral: NA     Living arrangements for the past 2 months: Apartment                 DME Arranged: N/A DME Agency: NA       HH Arranged: NA HH Agency: NA         Social Determinants of Health (SDOH) Interventions SDOH Screenings   Food Insecurity: No Food Insecurity (03/09/2024)  Housing: Low Risk  (03/09/2024)  Transportation Needs: No Transportation Needs (03/09/2024)  Utilities: Not At Risk (03/09/2024)  Depression (PHQ2-9): High Risk (08/04/2021)  Social Connections: Unknown (03/09/2024)  Tobacco Use: High Risk (03/09/2024)    Readmission Risk Interventions    03/10/2024    2:39 PM  Readmission Risk Prevention Plan  Post Dischage Appt Complete  Medication Screening Complete  Transportation Screening Complete

## 2024-03-15 NOTE — Progress Notes (Signed)
 PROGRESS NOTE    CIGI BEGA  EAV:[id number] DOB: Jan 10, 1956 DOA: 03/09/2024 PCP: Lorella Roles, MD    Brief Narrative:  68 y.o. female with medical history significant of HTN, diastolic CHF, COPD, DM type 2, uterine fibroids s/p abdominal hysterectomy, CKD stage IIIb presented with worsening left-sided back pain.  On presentation, creatinine was 3.19. UA was negative for leukocytes or nitrites but showed multiple bacteria.  CT renal study showed 3 x 5 mm nonobstructing calculus in the right kidney lower pole calyx with no other stones or obstructive uropathy.  She was started on IV fluids and analgesics.  Subsequently, renal function has improved.  MRI of thoracic spine showed severe canal stenosis at C5-C6.  Neurosurgery recommended outpatient follow-up.  PT recommended SNF placement.  TOC consulted.   Assessment & Plan:   Acute kidney injury on CKD stage IIIb Creatinine currently 1.31 s/p IV fluids Presented with creatinine of 3.19.  Possibly from recent excessive NSAID use along with combination of diuretics and lisinopril  CT renal study as above Daily BMP  Left flank/upper back pain Ongoing for 2 weeks MRI of thoracic spine showed severe canal stenosis at C5-C6 with prominent disc protrusion; moderate central canal narrowing at C6-7; moderate left foraminal stenosis at T8-9 Prior attending discussed with Dr. Verona Goodwill on phone on 03/12/2024: Recommended MRI of cervical spine and outpatient follow-up with neurosurgery.  MRI of cervical spine showed no evidence of discitis or osteomyelitis; multilevel spondylosis with most advanced at C5-6 with moderate spinal stenosis.  Outpatient follow-up with neurology Continue current pain management regimen, added Cymbalta  and tizanidine  as needed PT recommending SNF placement  Chronic diastolic heart failure Last echocardiogram was in 2017 which had shown EF of 65 to 70% with grade 2 diastolic dysfunction, appears  stable Diuretics and lisinopril  on hold Outpatient follow-up with PCP and/or cardiology  Essential hypertension Hyperlipidemia Continue amlodipine , hydralazine , statin Lisinopril , Aldactone  and Lasix  held  Nephrolithiasis Outpatient follow-up with urology as needed  Tobacco abuse Counseled regarding cessation by admitting hospitalist  Constipation Continue bowel regimen    DVT prophylaxis: Heparin  subcutaneous Code Status: Full Family Communication: None at bedside Disposition Plan: Status is: Inpatient Remains inpatient appropriate because: Of severity of illness. SNF placement.    Consultants: None  Procedures: None  Antimicrobials: None   Subjective: Overnight, patient reported significant back pain, stated she received her pain meds a little late.  Otherwise no new complaints.    Objective: Vitals:   03/14/24 1016 03/14/24 1300 03/14/24 1930 03/15/24 0508  BP: 122/69 130/71 137/63 135/77  Pulse: 76 78 95 91  Resp:  18 18 20   Temp:  98.3 F (36.8 C) 98.1 F (36.7 C)   TempSrc:  Oral Oral   SpO2:  95% 95% 100%  Weight:      Height:        Intake/Output Summary (Last 24 hours) at 03/15/2024 1530 Last data filed at 03/15/2024 0600 Gross per 24 hour  Intake 120 ml  Output 700 ml  Net -580 ml   Filed Weights   03/12/24 1712  Weight: 109.7 kg    Examination: General: NAD  Cardiovascular: S1, S2 present Respiratory: CTAB Abdomen: Soft, nontender, nondistended, bowel sounds present Musculoskeletal: Trace bilateral pedal edema noted Skin: Normal Psychiatry: Normal mood    Data Reviewed: I have personally reviewed following labs and imaging studies  CBC: Recent Labs  Lab 03/09/24 1103 03/10/24 0553 03/14/24 0442  WBC 9.0 7.2 7.6  NEUTROABS 6.2  --  4.0  HGB 16.2*  16.0* 14.8  HCT 49.7* 50.7* 47.5*  MCV 89.4 91.4 92.6  PLT 256 268 280   Basic Metabolic Panel: Recent Labs  Lab 03/10/24 0553 03/11/24 0517 03/13/24 0437  03/14/24 0442 03/15/24 0432  NA 137 137 133* 139 133*  K 4.5 4.8 4.8 5.3* 4.9  CL 98 104 101 102 100  CO2 25 22 25 27 25   GLUCOSE 129* 102* 110* 108* 129*  BUN 108* 80* 51* 38* 37*  CREATININE 2.29* 1.64* 1.35* 1.31* 1.35*  CALCIUM  9.6 8.8* 8.7* 9.2 8.8*  MG  --  2.6* 2.4 2.4  --    GFR: Estimated Creatinine Clearance: 47.6 mL/min (A) (by C-G formula based on SCr of 1.35 mg/dL (H)). Liver Function Tests: Recent Labs  Lab 03/09/24 1103  AST 20  ALT 14  ALKPHOS 64  BILITOT 0.6  PROT 7.4  ALBUMIN 3.6   Recent Labs  Lab 03/09/24 1103  LIPASE 44   No results for input(s): "AMMONIA" in the last 168 hours. Coagulation Profile: No results for input(s): "INR", "PROTIME" in the last 168 hours. Cardiac Enzymes: No results for input(s): "CKTOTAL", "CKMB", "CKMBINDEX", "TROPONINI" in the last 168 hours. BNP (last 3 results) No results for input(s): "PROBNP" in the last 8760 hours. HbA1C: No results for input(s): "HGBA1C" in the last 72 hours. CBG: No results for input(s): "GLUCAP" in the last 168 hours. Lipid Profile: No results for input(s): "CHOL", "HDL", "LDLCALC", "TRIG", "CHOLHDL", "LDLDIRECT" in the last 72 hours. Thyroid Function Tests: No results for input(s): "TSH", "T4TOTAL", "FREET4", "T3FREE", "THYROIDAB" in the last 72 hours. Anemia Panel: No results for input(s): "VITAMINB12", "FOLATE", "FERRITIN", "TIBC", "IRON", "RETICCTPCT" in the last 72 hours. Sepsis Labs: No results for input(s): "PROCALCITON", "LATICACIDVEN" in the last 168 hours.  Recent Results (from the past 240 hours)  Urine Culture (for pregnant, neutropenic or urologic patients or patients with an indwelling urinary catheter)     Status: None   Collection Time: 03/09/24 12:55 PM   Specimen: Urine, Clean Catch  Result Value Ref Range Status   Specimen Description   Final    URINE, CLEAN CATCH Performed at [facility], [address], [place], [place] [address]    Special  Requests   Final    NONE Performed at [facility], [address] [place], [place] [address]    Culture   Final    NO GROWTH Performed at [facility], [address] [place], [place] [address]    Report Status 03/10/2024 FINAL  Final         Radiology Studies: No results found.       Scheduled Meds:  amLODipine   10 mg Oral Daily   atorvastatin   20 mg Oral Daily   DULoxetine   60 mg Oral QHS   heparin   5,000 Units Subcutaneous Q8H   hydrALAZINE   25 mg Oral Q8H   lidocaine   2 patch Transdermal Q24H   methocarbamol   1,000 mg Oral TID   pantoprazole   40 mg Oral BID   polyethylene glycol  17 g Oral BID   senna-docusate  2 tablet Oral BID   sodium chloride  flush  3 mL Intravenous Q12H   Continuous Infusions:          Annaly Skop J Lurena Naeve, MD [facility] 03/15/2024, 3:30 PM

## 2024-03-15 NOTE — Progress Notes (Signed)
 Patient refusing morning vitals. Patient states she's not letting anyone take her vitals until she gets more pain medications and speaks with the doctor. Lorre Rosin, NP notified. Care plan continued.

## 2024-03-15 NOTE — Progress Notes (Signed)
 Physical Therapy Treatment Patient Details Name: Shari Obrien MRN: [id number] DOB: 1956/01/17 Today's Date: 03/15/2024   History of Present Illness 68 y.o. female presented with worsening left-sided back pain.WUJ:[id number], diastolic CHF, COPD, diabetes mellitus type 2, uterine fibroids s/p abdominal hysterectomy, and chronic kidney disease .  CT renal study showed 3 x 5 mm nonobstructing calculus in the right kidney. Lumbar OZH:[id number] degenerative changes of the lumbar spine with prominent  bilateral facet arthropathy, with varying degrees of spinal canal  stenosis and neural foraminal narrowing . MRI of thoracic spine pending    PT Comments  PT - Cognition Comments: Pt AxO x 3 still having a lot pain but willing to walk "some". General bed mobility comments: Pt OOB sitting on window bench with NT in room.  General transfer comment: Pt self able to rise with increased time and effort.  Pain is "constant".  Mid left back around T8T9 level.  Using heating pad. General Gait Details: limited amb distance 22 feet with walker.  Slow/sluggish gait.  Light lean on walker for support. LPT has rec Pt will need ST Rehab at SNF to address mobility and functional decline prior to safely returning home.    If plan is discharge home, recommend the following: Two people to help with walking and/or transfers;A lot of help with bathing/dressing/bathroom;Assistance with cooking/housework;Assist for transportation;Help with stairs or ramp for entrance   Can travel by private vehicle     No  Equipment Recommendations  None recommended by PT    Recommendations for Other Services       Precautions / Restrictions Precautions Precautions: Fall Precaution/Restrictions Comments: significant back pain Restrictions Weight Bearing Restrictions Per Provider Order: No     Mobility  Bed Mobility               General bed mobility comments: Pt OOB sitting on window bench with NT in room     Transfers Overall transfer level: Needs assistance Equipment used: Rolling walker (2 wheels) Transfers: Sit to/from Stand Sit to Stand: Supervision, Contact guard assist           General transfer comment: Pt self able to rise with increased time and effort.  Pain is "constant".  Mid left back around T8T9 level.  Using heating pad.    Ambulation/Gait Ambulation/Gait assistance: Supervision, Contact guard assist Gait Distance (Feet): 22 Feet Assistive device: None, Rolling walker (2 wheels) Gait Pattern/deviations: Step-to pattern, Decreased step length - right, Decreased step length - left Gait velocity: decreased     General Gait Details: limited amb distance 22 feet with walker.  Slow/sluggish gait.  Light lean on walker for support.   Stairs             Wheelchair Mobility     Tilt Bed    Modified Rankin (Stroke Patients Only)       Balance                                            Communication Communication Communication: No apparent difficulties  Cognition Arousal: Alert Behavior During Therapy: WFL for tasks assessed/performed   PT - Cognitive impairments: No apparent impairments                       PT - Cognition Comments: Pt AxO x 3 still having a lot pain but willing to walk "some".  Following commands: Intact      Cueing Cueing Techniques: Verbal cues  Exercises      General Comments        Pertinent Vitals/Pain Pain Assessment Pain Assessment: Faces Faces Pain Scale: Hurts even more Pain Location: back around T8T9 level Pain Descriptors / Indicators: Grimacing, Cramping, Guarding Pain Intervention(s): Monitored during session, Repositioned    Home Living                          Prior Function            PT Goals (current goals can now be found in the care plan section) Progress towards PT goals: Progressing toward goals    Frequency    Min 2X/week      PT Plan       Co-evaluation              AM-PAC PT "6 Clicks" Mobility   Outcome Measure  Help needed turning from your back to your side while in a flat bed without using bedrails?: A Little Help needed moving from lying on your back to sitting on the side of a flat bed without using bedrails?: A Little Help needed moving to and from a bed to a chair (including a wheelchair)?: A Little Help needed standing up from a chair using your arms (e.g., wheelchair or bedside chair)?: A Little Help needed to walk in hospital room?: A Little Help needed climbing 3-5 steps with a railing? : A Lot 6 Click Score: 17    End of Session Equipment Utilized During Treatment: Gait belt Activity Tolerance: Patient limited by fatigue;Patient limited by pain;Other (comment) (self limiting) Patient left: in chair;with call bell/phone within reach Nurse Communication: Mobility status PT Visit Diagnosis: Unsteadiness on feet (R26.81);Muscle weakness (generalized) (M62.81);Pain     Time: 4540-9811 PT Time Calculation (min) (ACUTE ONLY): 19 min  Charges:    $Gait Training: 8-22 mins PT General Charges $$ ACUTE PT VISIT: 1 Visit                    Bess Broody  PTA Acute  Rehabilitation Services Office M-F          [phone]

## 2024-03-15 NOTE — Plan of Care (Signed)

## 2024-03-16 DIAGNOSIS — N179 Acute kidney failure, unspecified: Secondary | ICD-10-CM | POA: Diagnosis not present

## 2024-03-16 DIAGNOSIS — N1832 Chronic kidney disease, stage 3b: Secondary | ICD-10-CM | POA: Diagnosis not present

## 2024-03-16 MED ORDER — HYDRALAZINE HCL 25 MG PO TABS: 25.0000 mg | ORAL_TABLET | Freq: Three times a day (TID) | ORAL | Status: DC

## 2024-03-16 MED ORDER — POLYETHYLENE GLYCOL 3350 17 G PO PACK: 17.0000 g | PACK | Freq: Two times a day (BID) | ORAL | Status: AC

## 2024-03-16 MED ORDER — LIDOCAINE 5 % EX PTCH: 2.0000 | MEDICATED_PATCH | CUTANEOUS | Status: AC

## 2024-03-16 MED ORDER — METHOCARBAMOL 1000 MG PO TABS: 500.0000 mg | ORAL_TABLET | Freq: Three times a day (TID) | ORAL | Status: AC

## 2024-03-16 MED ORDER — NICOTINE 21 MG/24HR TD PT24: 21.0000 mg | MEDICATED_PATCH | Freq: Every day | TRANSDERMAL | Status: DC | PRN

## 2024-03-16 MED ORDER — TIZANIDINE HCL 4 MG PO TABS: 4.0000 mg | ORAL_TABLET | Freq: Three times a day (TID) | ORAL | 6 refills | Status: AC | PRN

## 2024-03-16 MED ORDER — OXYCODONE HCL 5 MG PO TABS
5.0000 mg | ORAL_TABLET | Freq: Four times a day (QID) | ORAL | 0 refills | Status: AC | PRN
Start: 2024-03-16 — End: 2024-03-21

## 2024-03-16 MED ORDER — SENNOSIDES-DOCUSATE SODIUM 8.6-50 MG PO TABS: 2.0000 | ORAL_TABLET | Freq: Two times a day (BID) | ORAL | Status: DC

## 2024-03-16 MED ORDER — PANTOPRAZOLE SODIUM 40 MG PO TBEC: 40.0000 mg | DELAYED_RELEASE_TABLET | Freq: Two times a day (BID) | ORAL | Status: AC

## 2024-03-16 NOTE — Progress Notes (Signed)
 Attempted x2 to call report to [facility]. 1st attempt spoke brifey with jasmain who wanted a room number( however case manger stated she didn't get one)then when I didn't have one was placed on hold then someone hung up .Call back immediately and no answer.Aaron Aas
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
## 2024-03-16 NOTE — Plan of Care (Signed)

## 2024-03-16 NOTE — Discharge Summary (Signed)
 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 female with medical history significant of HTN, diastolic CHF, COPD, DM type 2, uterine fibroids s/p abdominal hysterectomy, CKD stage IIIb presented with worsening left-sided back pain.  On presentation, creatinine was 3.19. UA was negative for leukocytes or nitrites but showed multiple bacteria.  CT renal study showed 3 x 5 mm nonobstructing calculus in the right kidney lower pole calyx with no other stones or obstructive uropathy.  She was started on IV fluids and analgesics.  Subsequently, renal function has improved.  MRI of thoracic spine showed severe canal stenosis at C5-C6.  Neurosurgery recommended outpatient follow-up.  PT recommended SNF placement.  TOC consulted.    Today, patient denies any new complaints.  Pain controlled with recent pain regimen.    Assessment and Plan:  Acute kidney injury on CKD stage IIIb Creatinine currently 1.31 s/p IV fluids Presented with creatinine of 3.19.  Possibly from recent excessive NSAID use along with combination of diuretics and lisinopril  Hold lisinopril , spironolactone  for now pending follow-up with PCP   Left flank/upper back pain Ongoing for 2 weeks MRI of thoracic spine showed severe canal  stenosis at C5-C6 with prominent disc protrusion; moderate central canal narrowing at C6-7; moderate left foraminal stenosis at T8-9 Prior attending discussed with Dr. Verona Goodwill on phone on 03/12/2024: Recommended MRI of cervical spine and outpatient follow-up with neurosurgery MRI of cervical spine showed no evidence of discitis or osteomyelitis; multilevel spondylosis with most advanced at C5-6 with moderate spinal stenosis.  Outpatient follow-up with neurology Continue current pain management regimen with oxycodone , Robaxin , Cymbalta  and tizanidine  as needed SNF placement   Chronic diastolic heart failure Last echocardiogram was in 2017 which had shown EF of 65 to 70% with grade 2 diastolic dysfunction, appears stable Aldactone  and lisinopril  on hold as mentioned above Continue Lasix  Outpatient follow-up with PCP and/or cardiology   Essential hypertension Hyperlipidemia Continue amlodipine , hydralazine , statin Lisinopril , and Aldactone  held   Nephrolithiasis Outpatient follow-up with urology as needed   Tobacco abuse Counseled regarding cessation by admitting hospitalist   Constipation Continue bowel regimen  Morbid obesity Lifestyle modification advised         Consultants: None Procedures performed: None Disposition: Skilled nursing facility Diet recommendation:  Cardiac diet   DISCHARGE MEDICATION: Allergies as of 03/16/2024       Reactions   Periactin [cyproheptadine] Swelling        Medication List     PAUSE taking these medications    lisinopril  40 MG tablet Wait to take this until your doctor or other care provider tells you to start again. Commonly known as: ZESTRIL  Take 1 tablet (40 mg total) by mouth daily to lower blood pressure.   spironolactone  25 MG tablet Wait to take this until your  doctor or other care provider tells you to start again. Commonly known as: ALDACTONE  Take 1 tablet (25 mg total) by mouth daily. Please schedule PCP  appointment with Dr. Newlin for more refills.       STOP taking these medications    BAYER BACK & BODY PAIN EX ST PO   cetirizine  10 MG tablet Commonly known as: ZYRTEC    diazepam  5 MG tablet Commonly known as: VALIUM    Mitigare  0.6 MG Caps Generic drug: Colchicine    naproxen  500 MG tablet Commonly known as: NAPROSYN    oxyCODONE -acetaminophen  5-325 MG tablet Commonly known as: Percocet   traMADol  50 MG tablet Commonly known as: Ultram        TAKE these medications    Accu-Chek Guide Me w/Device Kit Use as directed daily before breakfast.   Accu-Chek Guide test strip Generic drug: glucose blood Use as instructed daily   acetaminophen  650 MG CR tablet Commonly known as: TYLENOL  Take 650 mg by mouth every 8 (eight) hours as needed for pain.   albuterol  108 (90 Base) MCG/ACT inhaler Commonly known as: Proventil  HFA Inhale 2 puffs into the lungs every 4 (four) hours as needed for wheezing or shortness of breath.   amLODipine  10 MG tablet Commonly known as: NORVASC  Take 1 tablet (10 mg total) by mouth daily.   atorvastatin  20 MG tablet Commonly known as: LIPITOR Take 1 tablet (20 mg total) by mouth daily.   DULoxetine  60 MG capsule Commonly known as: Cymbalta  Take 1 capsule (60 mg total) by mouth daily. For back pain   furosemide  40 MG tablet Commonly known as: LASIX  Take 1 tablet (40 mg total) by mouth daily.   hydrALAZINE  25 MG tablet Commonly known as: APRESOLINE  Take 1 tablet (25 mg total) by mouth every 8 (eight) hours.   hydrocortisone  2.5 % cream Apply topically 2 (two) times daily. What changed:  how much to take when to take this reasons to take this   ipratropium-albuterol  0.5-2.5 (3) MG/3ML Soln Commonly known as: DUONEB Take 3 mLs by nebulization every 6 (six) hours as needed.   lidocaine  5 % Commonly known as: LIDODERM  Place 2 patches onto the skin daily. Remove & Discard patch within 12 hours or as directed by MD Start taking on:  March 17, 2024   Methocarbamol  1000 MG Tabs Take 500 mg by mouth 3 (three) times daily for 7 days.   nicotine  21 mg/24hr patch Commonly known as: NICODERM CQ  - dosed in mg/24 hours Place 1 patch (21 mg total) onto the skin daily as needed (smoking cessation).   oxyCODONE  5 MG immediate release tablet Commonly known as: Oxy IR/ROXICODONE  Take 1-2 tablets (5-10 mg total) by mouth every 6 (six) hours as needed for up to 5 days for moderate pain (pain score 4-6).   OXYGEN  Inhale 2 L into the lungs as needed (shortness of breath).   Ozempic  (0.25 or 0.5 MG/DOSE) 2 MG/3ML Sopn Generic drug: Semaglutide (0.25 or 0.5MG /DOS) INJECT 0.25MG  UNDER THE SKIN ONE TIME WEEKLY (DISCONTINUE METFORMIN )   pantoprazole  40 MG tablet Commonly known as: PROTONIX  Take 1 tablet (40 mg total) by mouth 2 (two) times daily.   polyethylene glycol 17 g packet Commonly known as: MIRALAX  / GLYCOLAX  Take 17 g by mouth 2 (two) times daily.   senna-docusate 8.6-50 MG tablet Commonly known as: Senokot-S Take 2 tablets by mouth 2 (two) times daily.   tiZANidine  4 MG tablet Commonly known as: Zanaflex  Take 1 tablet (4 mg total) by mouth every 8 (eight)  hours as needed for muscle spasms.   Trelegy Ellipta  100-62.5-25 MCG/ACT Aepb Generic drug: Fluticasone -Umeclidin-Vilant Inhale 1 puff into the lungs daily. Must have office visit for refills What changed:  when to take this reasons to take this        Contact information for follow-up providers     Paliwal, Himanshu, MD Follow up.   Specialty: Family Medicine Contact information: [address] [place] [address] [phone]              Contact information for after-discharge care     Destination     [facility] .   Service: Skilled Nursing Contact information: [address] [place]  [phone]                    Discharge Exam: Cleavon Curls Weights   03/12/24  1712  Weight: 109.7 kg   General: NAD  Cardiovascular: S1, S2 present Respiratory: CTAB Abdomen: Soft, nontender, nondistended, bowel sounds present Musculoskeletal: No bilateral pedal edema noted Skin: Normal Psychiatry: Normal mood   Condition at discharge: stable  The results of significant diagnostics from this hospitalization (including imaging, microbiology, ancillary and laboratory) are listed below for reference.   Imaging Studies: MR CERVICAL SPINE WO CONTRAST Result Date: 03/12/2024 CLINICAL DATA:  Cervical radiculopathy, infection suspected, no prior imaging Abnormal finding on recent T-spine MRI. EXAM: MRI CERVICAL SPINE WITHOUT CONTRAST TECHNIQUE: Multiplanar, multisequence MR imaging of the cervical spine was performed. No intravenous contrast was administered. COMPARISON:  MRI of the thoracic spine 03/11/2024. Chest CT 06/03/2023. FINDINGS: Technical note: Despite efforts by the technologist and patient, mild-to-moderate motion artifact is present on today's exam and could not be eliminated. This reduces exam sensitivity and specificity. Alignment: Straightening with minimal anterolisthesis at C4-5. No focal angulation. Vertebrae: No evidence of acute fracture or traumatic subluxation. No evidence of discitis or osteomyelitis. Cord: Mild cord flattening at C4-5, C5-6 and C6-7. Possible mild myelomalacia at C5. Posterior Fossa, vertebral arteries, paraspinal tissues: Visualized portions of the posterior fossa appear unremarkable.Bilateral vertebral artery flow voids. No significant paraspinal findings. Disc levels: C2-3: Preserved disc height. No significant spinal stenosis or foraminal narrowing. C3-4: Preserved disc height with mild uncinate spurring and facet hypertrophy. No spinal stenosis or significant foraminal narrowing. C4-5: Mild loss of disc height with a left paracentral disc protrusion, uncinate spurring and bilateral facet hypertrophy. Resulting mild spinal stenosis with  mild cord flattening and mild to moderate foraminal narrowing bilaterally. C5-6: Spondylosis with loss of disc height and posterior osteophytes covering diffusely bulging disc material. Moderate spinal stenosis with cord flattening and moderate to severe foraminal narrowing bilaterally. Superimposed possible ossification of the posterior longitudinal ligament extending inferiorly to the C6-7 disc space level. C6-7: Spondylosis with loss of disc height and posterior osteophytes covering diffusely bulging disc material. Mild left-sided cord flattening with moderate left foraminal narrowing. The right foramen appears adequately patent. C7-T1: No significant findings. IMPRESSION: 1. Mild to moderate motion artifact. 2. No evidence of discitis or osteomyelitis. 3. Multilevel cervical spondylosis, most advanced at C5-6 where there is moderate spinal stenosis with cord flattening and moderate to severe foraminal narrowing bilaterally. 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 This could be confirmed with CT of the cervical spine, especially if surgical intervention is contemplated. 4. Mild spinal stenosis with mild cord flattening and mild to moderate foraminal narrowing bilaterally at C4-5. 5. Mild left-sided cord flattening with moderate left  foraminal narrowing at C6-7. 6. Possible mild myelomalacia at C5. Electronically Signed   By: Elmon Hagedorn M.D.   On: 03/12/2024 17:45   MR THORACIC SPINE WO CONTRAST Result Date: 03/11/2024 CLINICAL DATA:  Mid back pain.  Spondyloarthropathy suspected EXAM: MRI THORACIC SPINE WITHOUT CONTRAST TECHNIQUE: Multiplanar, multisequence MR imaging of the thoracic spine was performed. No intravenous contrast was administered. COMPARISON:  CT chest once scratched at CT chest 06/03/2023 FINDINGS: Alignment: No significant listhesis is present. Mildly exaggerated thoracic kyphosis is again noted. Rightward curvature  is centered at T7. Vertebrae: Marrow signal and vertebral body heights are normal. 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 Cord signal and morphology is otherwise within normal limits. The conus medullaris terminates at L1. Paraspinal and other soft tissues: The paraspinous soft tissues are within normal limits. Visualized upper abdomen is unremarkable. Disc levels: Severe central canal stenosis is present at C5-6. Moderate central canal narrowing is present at C6-7. The thoracic disc levels at T4-5 and above are within normal limits. T5-6: A shallow central disc protrusion is present. No focal stenosis is present. The foramina are patent bilaterally. T6-7: A leftward disc protrusion is present. Mild left foraminal narrowing is present. T7-8: A leftward disc protrusion is present. Mild left foraminal narrowing is present. T8-9: A leftward disc protrusion and asymmetric left-sided facet hypertrophy contribute to moderate left foraminal stenosis. The central canal is patent. T9-10: Mild facet hypertrophy is noted bilaterally. No focal disc protrusion or stenosis is present. T10-11: Mild right-sided facet hypertrophy is present without significant stenosis. The disc levels at T11-12 and T12-L1 are within normal limits. IMPRESSION: 1. 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 2. Moderate central canal narrowing at C6-7. 3. Mild left foraminal narrowing at T6-7 and T7-8. 4. Moderate left foraminal stenosis at T8-9. 5. Mild facet hypertrophy bilaterally at T9-10 and on the right at T10-11 without significant stenosis. Electronically Signed   By: Audree Leas M.D.   On: 03/11/2024 19:11   CT Renal Stone Study Result Date: 03/09/2024 CLINICAL DATA:  Abdominal/flank pain, stone suspected. EXAM: CT ABDOMEN AND PELVIS WITHOUT  CONTRAST TECHNIQUE: Multidetector CT imaging of the abdomen and pelvis was performed following the standard protocol without IV contrast. RADIATION DOSE REDUCTION: This exam was performed according to the departmental dose-optimization program which includes automated exposure control, adjustment of the mA and/or kV according to patient size and/or use of iterative reconstruction technique. COMPARISON:  CT scan abdomen and pelvis report from 05/24/2019. Please note images are not available for review at the time of dictation. FINDINGS: Lower chest: There are patchy atelectatic changes in the visualized lung bases. No overt consolidation. No pleural effusion. The heart is normal in size. No pericardial effusion. Hepatobiliary: The liver is normal in size. Non-cirrhotic configuration. No suspicious mass. No intrahepatic or extrahepatic bile duct dilation. No calcified gallstones. Normal gallbladder wall thickness. No pericholecystic inflammatory changes. Pancreas: Unremarkable. No pancreatic ductal dilatation or surrounding inflammatory changes. Spleen: Within normal limits. No focal lesion. Adrenals/Urinary Tract: Adrenal glands are unremarkable. No suspicious renal mass within the limitations of this unenhanced exam. There is a 3 x 5 mm nonobstructing calculus in the right kidney lower pole calyx. No other nephroureterolithiasis or obstructive uropathy on either side. Urinary bladder is under distended, precluding optimal assessment. However, no large mass or stones identified. No perivesical fat stranding. Stomach/Bowel: No disproportionate dilation  of the small or large bowel loops. No evidence of abnormal bowel wall thickening or inflammatory changes. The appendix is unremarkable. There are multiple diverticula throughout the colon, without imaging signs of diverticulitis. Vascular/Lymphatic: No ascites or pneumoperitoneum. No abdominal or pelvic lymphadenopathy, by size criteria. No aneurysmal dilation of the  major abdominal arteries. There are moderate peripheral atherosclerotic vascular calcifications of the aorta and its major branches. Reproductive: The uterus is surgically absent. No large adnexal mass. Other: There is a tiny fat containing umbilical hernia. The soft tissues and abdominal wall are otherwise unremarkable. Musculoskeletal: No suspicious osseous lesions. There are mild - moderate multilevel degenerative changes in the visualized spine. IMPRESSION: 1. There is a 3 x 5 mm nonobstructing calculus in the right kidney lower pole calyx. No other nephroureterolithiasis or obstructive uropathy on either side. 2. No acute inflammatory process identified within the abdomen or pelvis. Unremarkable appendix. 3. Multiple other nonacute observations, as described above. Aortic Atherosclerosis (ICD10-I70.0). Electronically Signed   By: Beula Brunswick M.D.   On: 03/09/2024 15:50   DG Ribs Unilateral W/Chest Left Result Date: 03/09/2024 CLINICAL DATA:  2 week history of left-sided upper back pain EXAM: LEFT RIBS AND CHEST - 5 VIEW COMPARISON:  Chest radiograph dated 02/27/2019 FINDINGS: No acute displaced fracture or other bone lesions are seen involving the ribs. No pneumothorax. Trace blunting of the left costophrenic angle. Right mid and left lower lung linear opacities. Heart size and mediastinal contours are within normal limits. IMPRESSION: 1. No acute displaced rib fracture. 2. Trace blunting of the left costophrenic angle, which may represent a trace pleural effusion versus pleural thickening. 3. Right mid and left lower lung linear opacities, likely atelectasis. Electronically Signed   By: Limin  Xu M.D.   On: 03/09/2024 11:45    Microbiology: Results for orders placed or performed during the hospital encounter of 03/09/24  Urine Culture (for pregnant, neutropenic or urologic patients or patients with an indwelling urinary catheter)     Status: None   Collection Time: 03/09/24 12:55 PM   Specimen:  Urine, Clean Catch  Result Value Ref Range Status   Specimen Description   Final    URINE, CLEAN CATCH Performed at [facility], [address], [place], [place] [address]    Special Requests   Final    NONE Performed at [facility], [address] [place], [place] [address]    Culture   Final    NO GROWTH Performed at [facility], [address], [place], [place] [address]    Report Status 03/10/2024 FINAL  Final    Labs: CBC: Recent Labs  Lab 03/10/24 0553 03/14/24 0442  WBC 7.2 7.6  NEUTROABS  --  4.0  HGB 16.0* 14.8  HCT 50.7* 47.5*  MCV 91.4 92.6  PLT 268 280   Basic Metabolic Panel: Recent Labs  Lab 03/10/24 0553 03/11/24 0517 03/13/24 0437 03/14/24 0442 03/15/24 0432  NA 137 137 133* 139 133*  K 4.5 4.8 4.8 5.3* 4.9  CL 98 104 101 102 100  CO2 25 22 25 27 25   GLUCOSE 129* 102* 110* 108* 129*  BUN 108* 80* 51* 38* 37*  CREATININE 2.29* 1.64* 1.35* 1.31* 1.35*  CALCIUM  9.6 8.8* 8.7* 9.2 8.8*  MG  --  2.6* 2.4 2.4  --    Liver Function Tests: No results for input(s): "AST", "ALT", "ALKPHOS", "BILITOT", "PROT", "ALBUMIN" in the last 168 hours. CBG: No results for input(s): "GLUCAP" in the  last 168 hours.  Discharge time spent: greater than 30 minutes.  Signed: Veronica Gordon, MD [facility] 03/16/2024

## 2024-03-16 NOTE — TOC Transition Note (Signed)
 Transition of Care [facility]) - Discharge Note   Patient Details  Name: Shari Obrien MRN: [id number] Date of Birth: October 06, 1956  Transition [facility]) CM/SW Contact:  Kathryn Parish, RN Phone Number: 03/16/2024, 1:53 PM   Clinical Narrative:     Per MD, patient ready for DC to [facility] in [place]. RN to call report prior to discharge [phone]. RN, patient, and facility notified of DC. Discharge Summary and FL2 sent to facility. DC packet on chart includes medical necessity, face sheet, and 2 signed prescriptions. [facility] transport requested for patient at 1:00 PM  NCM will sign off. Please consult us  again if new needs arise.    Final next level of care: Skilled Nursing Facility Barriers to Discharge: Barriers Resolved   Patient Goals and CMS Choice Patient states their goals for this hospitalization and ongoing recovery are:: Rehab CMS Medicare.gov Compare Post Acute Care list provided to:: Patient Choice offered to / list presented to : Patient [place] ownership interest in [facility].provided to:: Patient    Discharge Placement   Existing PASRR number confirmed : 03/16/24          Patient chooses bed at:  [facility] [place]) Patient to be transferred to facility by: [facility] Name of family member notified: Patient requested that no one be notified Patient and family notified of of transfer: 03/16/24  Discharge Plan and Services Additional resources added to the After Visit Summary for   In-house Referral: NA              DME Arranged: N/A DME Agency: NA       HH Arranged: NA HH Agency: NA        Social Drivers of Health (SDOH) Interventions SDOH Screenings   Food Insecurity: No Food Insecurity (03/09/2024)  Housing: Low Risk  (03/09/2024)  Transportation Needs: No Transportation Needs (03/09/2024)  Utilities: Not At Risk (03/09/2024)  Depression (PHQ2-9): High Risk (08/04/2021)  Social Connections: Unknown  (03/09/2024)  Tobacco Use: High Risk (03/09/2024)     Readmission Risk Interventions    03/10/2024    2:39 PM  Readmission Risk Prevention Plan  Post Dischage Appt Complete  Medication Screening Complete  Transportation Screening Complete

## 2024-03-16 NOTE — Progress Notes (Signed)
 Occupational Therapy Treatment Patient Details Name: Shari Obrien MRN: [id number] DOB: January 14, 1956 Today's Date: 03/16/2024   History of present illness 68 yr old female who presented with worsening left-sided back pain.WUJ:[id number], diastolic CHF, COPD, diabetes mellitus type 2, uterine fibroids s/p abdominal hysterectomy, and chronic kidney disease .  CT renal study showed 3 x 5 mm nonobstructing calculus in the right kidney. Lumbar OZH:YQMVHQIONG degenerative changes of the lumbar spine with prominent  bilateral facet arthropathy, with varying degrees of spinal canal  stenosis and neural foraminal narrowing.   OT comments  The pt presented with much improved activity tolerance and functional abilities today, as compared to the prior OT session. She reported minimal pain at rest, however stated pain in her low back to be spontaneous and associated with movement. She progressed to sitting edge of bed for a good duration, then into standing using a RW, and ambulating into the hall. She further reported feelings of fatigue and shortness of breath, which attributes to a history of COPD. Pt may progress to return home with home therapy at discharge, should she continue to maintain or improve her functional abilities. Continue OT plan of care.       If plan is discharge home, recommend the following:  A little help with bathing/dressing/bathroom;Assistance with cooking/housework;A lot of help with walking and/or transfers   Equipment Recommendations  Other (comment) (defer to next level of care)    Recommendations for Other Services      Precautions / Restrictions Precautions Precautions: Fall Restrictions Weight Bearing Restrictions Per Provider Order: No       Mobility Bed Mobility Overal bed mobility: Needs Assistance Bed Mobility: Supine to Sit     Supine to sit: Supervision, HOB elevated, Used rails          Transfers Overall transfer level: Needs assistance Equipment  used: Rolling walker (2 wheels) Transfers: Sit to/from Stand Sit to Stand: Contact guard assist                 Balance     Sitting balance-Leahy Scale: Good       Standing balance-Leahy Scale: Fair          ADL either performed or assessed with clinical judgement           Communication Communication Communication: No apparent difficulties   Cognition Arousal: Alert Behavior During Therapy: WFL for tasks assessed/performed        Following commands: Intact        Cueing   Cueing Techniques: Verbal cues             Pertinent Vitals/ Pain       Pain Assessment Pain Assessment: 0-10 Pain Score: 2  Pain Location: low back Pain Intervention(s): Monitored during session, Limited activity within patient's tolerance   Frequency  Min 2X/week        Progress Toward Goals  OT Goals(current goals can now be found in the care plan section)  Progress towards OT goals: Progressing toward goals  Acute Rehab OT Goals Patient Stated Goal: controlled pain OT Goal Formulation: With patient Time For Goal Achievement: 03/25/24 Potential to Achieve Goals: Good ADL Goals Pt Will Perform Lower Body Dressing: with supervision;sitting/lateral leans;with adaptive equipment;sit to/from stand Pt Will Transfer to Toilet: with supervision;ambulating Pt Will Perform Toileting - Clothing Manipulation and hygiene: with supervision;sit to/from stand  Plan         AM-PAC OT "6 Clicks" Daily Activity     Outcome Measure  Help from another person eating meals?: None Help from another person taking care of personal grooming?: None Help from another person toileting, which includes using toliet, bedpan, or urinal?: A Little Help from another person bathing (including washing, rinsing, drying)?: A Little Help from another person to put on and taking off regular upper body clothing?: A Little Help from another person to put on and taking off regular lower body clothing?:  A Little 6 Click Score: 20    End of Session Equipment Utilized During Treatment: Rolling walker (2 wheels)  OT Visit Diagnosis: Unsteadiness on feet (R26.81);Pain   Activity Tolerance Patient tolerated treatment well   Patient Left in chair;with call bell/phone within reach   Nurse Communication Mobility status        Time: 1610-9604 OT Time Calculation (min): 24 min  Charges: OT General Charges $OT Visit: 1 Visit OT Treatments $Therapeutic Activity: 23-37 mins     Sheralyn Dies, OTR/L 03/16/2024, 1:01 PM

## 2024-03-16 NOTE — Plan of Care (Signed)
  Problem: Education: Goal: Knowledge of General Education information will improve Description: Including pain rating scale, medication(s)/side effects and non-pharmacologic comfort measures 03/16/2024 1405 by Verdon Glance, RN Outcome: Completed/Met 03/16/2024 1405 by Verdon Glance, RN Outcome: Progressing   Problem: Health Behavior/Discharge Planning: Goal: Ability to manage health-related needs will improve 03/16/2024 1405 by Verdon Glance, RN Outcome: Completed/Met 03/16/2024 1405 by Verdon Glance, RN Outcome: Progressing   Problem: Clinical Measurements: Goal: Ability to maintain clinical measurements within normal limits will improve 03/16/2024 1405 by Verdon Glance, RN Outcome: Completed/Met 03/16/2024 1405 by Verdon Glance, RN Outcome: Progressing Goal: Will remain free from infection 03/16/2024 1405 by Verdon Glance, RN Outcome: Completed/Met 03/16/2024 1405 by Verdon Glance, RN Outcome: Progressing Goal: Diagnostic test results will improve 03/16/2024 1405 by Verdon Glance, RN Outcome: Completed/Met 03/16/2024 1405 by Verdon Glance, RN Outcome: Progressing Goal: Respiratory complications will improve 03/16/2024 1405 by Verdon Glance, RN Outcome: Completed/Met 03/16/2024 1405 by Verdon Glance, RN Outcome: Progressing Goal: Cardiovascular complication will be avoided 03/16/2024 1405 by Verdon Glance, RN Outcome: Completed/Met 03/16/2024 1405 by Verdon Glance, RN Outcome: Progressing   Problem: Activity: Goal: Risk for activity intolerance will decrease 03/16/2024 1405 by Verdon Glance, RN Outcome: Completed/Met 03/16/2024 1405 by Verdon Glance, RN Outcome: Progressing   Problem: Nutrition: Goal: Adequate nutrition will be maintained 03/16/2024 1405 by Verdon Glance, RN Outcome: Completed/Met 03/16/2024 1405 by Verdon Glance, RN Outcome: Progressing   Problem: Coping: Goal: Level of anxiety will  decrease 03/16/2024 1405 by Verdon Glance, RN Outcome: Completed/Met 03/16/2024 1405 by Verdon Glance, RN Outcome: Progressing   Problem: Elimination: Goal: Will not experience complications related to bowel motility 03/16/2024 1405 by Verdon Glance, RN Outcome: Completed/Met 03/16/2024 1405 by Verdon Glance, RN Outcome: Progressing Goal: Will not experience complications related to urinary retention 03/16/2024 1405 by Verdon Glance, RN Outcome: Completed/Met 03/16/2024 1405 by Verdon Glance, RN Outcome: Progressing   Problem: Pain Managment: Goal: General experience of comfort will improve and/or be controlled 03/16/2024 1405 by Verdon Glance, RN Outcome: Completed/Met 03/16/2024 1405 by Verdon Glance, RN Outcome: Progressing   Problem: Safety: Goal: Ability to remain free from injury will improve 03/16/2024 1405 by Verdon Glance, RN Outcome: Completed/Met 03/16/2024 1405 by Verdon Glance, RN Outcome: Progressing   Problem: Skin Integrity: Goal: Risk for impaired skin integrity will decrease 03/16/2024 1405 by Verdon Glance, RN Outcome: Completed/Met 03/16/2024 1405 by Verdon Glance, RN Outcome: Progressing  Report called to [facility].

## 2024-11-28 ENCOUNTER — Ambulatory Visit (INDEPENDENT_AMBULATORY_CARE_PROVIDER_SITE_OTHER): Admitting: Podiatry

## 2024-11-28 ENCOUNTER — Ambulatory Visit

## 2024-11-28 DIAGNOSIS — M7732 Calcaneal spur, left foot: Secondary | ICD-10-CM

## 2024-11-28 DIAGNOSIS — M722 Plantar fascial fibromatosis: Secondary | ICD-10-CM

## 2024-11-28 DIAGNOSIS — M7731 Calcaneal spur, right foot: Secondary | ICD-10-CM

## 2024-11-28 MED ORDER — PREDNISONE 5 MG PO TABS: 5.0000 mg | ORAL_TABLET | Freq: Every day | ORAL | 0 refills | Status: DC

## 2024-11-28 NOTE — Patient Instructions (Signed)

## 2024-11-28 NOTE — Progress Notes (Signed)
 Patient presents complaining of pain at the heels and along the bottom of the feet bilaterally.  Has been extremely painful the past few weeks with difficulty walking.  Limited pain at the posterior aspect of the heel on the left.  Has not noticed any redness or swelling.  Has a history of plantar fasciitis we saw her over a year ago in the old office.   Physical exam:  General appearance: Pleasant, and in no acute distress. AOx3.  Vascular: Pedal pulses: DP 2/4 bilaterally, PT 2/4 bilaterally.  Mild to moderate edema lower legs bilaterally. Capillary fill time immediate b/l.  Neurological: Light touch intact feet bilaterally.  Normal Achilles reflex bilaterally.  No clonus or spasticity noted.  Negative Tinel sign tarsal tunnel and porta pedis bilaterally  Dermatologic:   Skin normal temperature bilaterally.  Skin normal color, tone, and texture bilaterally.   Musculoskeletal: Tenderness plantar medial aspect heel at the medial plantar calcaneal tubercle.  Tenderness along the entire length of the plantar fascia bilaterally.  No fibromas noted.  Tenderness on the posterior aspect of the heel and Achilles tendon insertion on the left.  No tenderness lateral compression of the calcaneus  Radiographs: 3 views foot bilaterally: Osteophytic changes plantar and posterior aspect calcaneus.  Notes any fractures or dislocations.  Normal Kager's triangle.  Normal bone density.  No Insa any bone tumors.  Diagnosis: 1.  Plantar fasciitis bilaterally 2.  Calcaneal spurs bilaterally   Plan: -Established office visit for evaluation and management.  Level 3. -Discussed with her plantar fasciitis and etiology and treatment.  Discussed proper shoes to wear. -Dispensed at home exercises and therapies she can do - Rx prednisone  5 mg, 30 mg p.o. daily first day, then decrease by 5 mg every other day for 12 days. -Dispensed OTC orthotics feet bilaterally  Return 2 weeks follow-up plantar fasciitis  bilaterally

## 2024-12-11 ENCOUNTER — Ambulatory Visit
Admission: EM | Admit: 2024-12-11 | Discharge: 2024-12-11 | Disposition: A | Discharge status: Home / Self Care | Attending: Family Medicine | Admitting: Family Medicine

## 2024-12-11 DIAGNOSIS — M7989 Other specified soft tissue disorders: Secondary | ICD-10-CM

## 2024-12-11 DIAGNOSIS — M25432 Effusion, left wrist: Secondary | ICD-10-CM | POA: Diagnosis not present

## 2024-12-11 DIAGNOSIS — M25532 Pain in left wrist: Secondary | ICD-10-CM | POA: Diagnosis not present

## 2024-12-11 MED ORDER — ACETAMINOPHEN 325 MG PO TABS
650.0000 mg | ORAL_TABLET | Freq: Once | ORAL | Status: AC
  Administered 2024-12-11: 650 mg via ORAL

## 2024-12-11 NOTE — ED Notes (Signed)
 Patient is being discharged from the Urgent Care and sent to the Emergency Department via private vehicle . Per DOROTHA Bold NP, patient is in need of higher level of care due to hand pain and swelling. Patient is aware and verbalizes understanding of plan of care.  Vitals:   12/11/24 1437  BP: 136/83  Pulse: (!) 131  Resp: 20  Temp: 97.8 F (36.6 C)  SpO2: 91%
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
## 2024-12-11 NOTE — Discharge Instructions (Signed)
 Please go to the emergency room for further evaluation of your hand and wrist swelling/extreme pain

## 2024-12-11 NOTE — ED Triage Notes (Signed)
 Pt states left hand/wrist swelling for the past 2 days.  States she has tried ice/heat and tramadol  at home with no relief.  Pt denies any injury. Left wrist and hand swollen, tender to touch.  Limited range of motion due to pain.  Pt crying.

## 2024-12-11 NOTE — ED Notes (Signed)
 Pt states she does not want to go to the ED.  States she wants to just take a cab and go home.  Cab called for patient.  Pt instructed to go to the ED if her pain persists.  States the Tylenol  we gave her did help ease some of the pain.  Pt in [place] waiting for cab.

## 2024-12-12 ENCOUNTER — Other Ambulatory Visit: Payer: Self-pay

## 2024-12-12 ENCOUNTER — Inpatient Hospital Stay (HOSPITAL_COMMUNITY)
Admission: EM | Admit: 2024-12-12 | Discharge: 2024-12-14 | DRG: 603 | Disposition: A | Discharge status: Home / Self Care | Attending: Family Medicine | Admitting: Family Medicine

## 2024-12-12 ENCOUNTER — Ambulatory Visit: Admitting: Podiatry

## 2024-12-12 ENCOUNTER — Emergency Department (HOSPITAL_COMMUNITY)

## 2024-12-12 DIAGNOSIS — Z79899 Other long term (current) drug therapy: Secondary | ICD-10-CM

## 2024-12-12 DIAGNOSIS — Z9981 Dependence on supplemental oxygen: Secondary | ICD-10-CM

## 2024-12-12 DIAGNOSIS — I130 Hypertensive heart and chronic kidney disease with heart failure and stage 1 through stage 4 chronic kidney disease, or unspecified chronic kidney disease: Secondary | ICD-10-CM | POA: Diagnosis present

## 2024-12-12 DIAGNOSIS — Z8249 Family history of ischemic heart disease and other diseases of the circulatory system: Secondary | ICD-10-CM

## 2024-12-12 DIAGNOSIS — E785 Hyperlipidemia, unspecified: Secondary | ICD-10-CM | POA: Diagnosis not present

## 2024-12-12 DIAGNOSIS — L03114 Cellulitis of left upper limb: Principal | ICD-10-CM | POA: Diagnosis present

## 2024-12-12 DIAGNOSIS — J449 Chronic obstructive pulmonary disease, unspecified: Secondary | ICD-10-CM | POA: Diagnosis present

## 2024-12-12 DIAGNOSIS — E66813 Obesity, class 3: Secondary | ICD-10-CM | POA: Diagnosis present

## 2024-12-12 DIAGNOSIS — L03119 Cellulitis of unspecified part of limb: Secondary | ICD-10-CM | POA: Diagnosis not present

## 2024-12-12 DIAGNOSIS — D751 Secondary polycythemia: Secondary | ICD-10-CM | POA: Diagnosis not present

## 2024-12-12 DIAGNOSIS — I5032 Chronic diastolic (congestive) heart failure: Secondary | ICD-10-CM | POA: Diagnosis present

## 2024-12-12 DIAGNOSIS — I10 Essential (primary) hypertension: Secondary | ICD-10-CM | POA: Diagnosis present

## 2024-12-12 DIAGNOSIS — N183 Chronic kidney disease, stage 3 unspecified: Secondary | ICD-10-CM | POA: Diagnosis not present

## 2024-12-12 DIAGNOSIS — Z6841 Body Mass Index (BMI) 40.0 and over, adult: Secondary | ICD-10-CM

## 2024-12-12 DIAGNOSIS — J439 Emphysema, unspecified: Secondary | ICD-10-CM

## 2024-12-12 DIAGNOSIS — I5030 Unspecified diastolic (congestive) heart failure: Secondary | ICD-10-CM | POA: Diagnosis present

## 2024-12-12 DIAGNOSIS — Z90710 Acquired absence of both cervix and uterus: Secondary | ICD-10-CM

## 2024-12-12 DIAGNOSIS — E1122 Type 2 diabetes mellitus with diabetic chronic kidney disease: Secondary | ICD-10-CM | POA: Diagnosis not present

## 2024-12-12 DIAGNOSIS — Z7985 Long-term (current) use of injectable non-insulin antidiabetic drugs: Secondary | ICD-10-CM

## 2024-12-12 DIAGNOSIS — F17210 Nicotine dependence, cigarettes, uncomplicated: Secondary | ICD-10-CM | POA: Diagnosis present

## 2024-12-12 DIAGNOSIS — M1812 Unilateral primary osteoarthritis of first carpometacarpal joint, left hand: Secondary | ICD-10-CM | POA: Diagnosis present

## 2024-12-12 DIAGNOSIS — N1831 Chronic kidney disease, stage 3a: Secondary | ICD-10-CM | POA: Diagnosis present

## 2024-12-12 LAB — CBC WITH DIFFERENTIAL/PLATELET
Abs Immature Granulocytes: 0.06 K/uL (ref 0.00–0.07)
Basophils Absolute: 0 K/uL (ref 0.0–0.1)
Basophils Relative: 0 %
Eosinophils Absolute: 0 K/uL (ref 0.0–0.5)
Eosinophils Relative: 0 %
HCT: 53 % — ABNORMAL HIGH (ref 36.0–46.0)
Hemoglobin: 17.1 g/dL — ABNORMAL HIGH (ref 12.0–15.0)
Immature Granulocytes: 1 %
Lymphocytes Relative: 15 %
Lymphs Abs: 1.5 K/uL (ref 0.7–4.0)
MCH: 28.7 pg (ref 26.0–34.0)
MCHC: 32.3 g/dL (ref 30.0–36.0)
MCV: 89.1 fL (ref 80.0–100.0)
Monocytes Absolute: 1 K/uL (ref 0.1–1.0)
Monocytes Relative: 10 %
Neutro Abs: 6.9 K/uL (ref 1.7–7.7)
Neutrophils Relative %: 74 %
Platelets: 202 K/uL (ref 150–400)
RBC: 5.95 MIL/uL — ABNORMAL HIGH (ref 3.87–5.11)
RDW: 16.5 % — ABNORMAL HIGH (ref 11.5–15.5)
Smear Review: NORMAL
WBC: 9.5 K/uL (ref 4.0–10.5)
nRBC: 0 % (ref 0.0–0.2)

## 2024-12-12 LAB — COMPREHENSIVE METABOLIC PANEL WITH GFR
ALT: 16 U/L (ref 0–44)
AST: 22 U/L (ref 15–41)
Albumin: 3.9 g/dL (ref 3.5–5.0)
Alkaline Phosphatase: 87 U/L (ref 38–126)
Anion gap: 14 (ref 5–15)
BUN: 25 mg/dL — ABNORMAL HIGH (ref 8–23)
CO2: 22 mmol/L (ref 22–32)
Calcium: 9.2 mg/dL (ref 8.9–10.3)
Chloride: 102 mmol/L (ref 98–111)
Creatinine, Ser: 1.05 mg/dL — ABNORMAL HIGH (ref 0.44–1.00)
GFR, Estimated: 57 mL/min — ABNORMAL LOW
Glucose, Bld: 94 mg/dL (ref 70–99)
Potassium: 4.5 mmol/L (ref 3.5–5.1)
Sodium: 138 mmol/L (ref 135–145)
Total Bilirubin: 1 mg/dL (ref 0.0–1.2)
Total Protein: 7.5 g/dL (ref 6.5–8.1)

## 2024-12-12 LAB — HEMOGLOBIN A1C
Hgb A1c MFr Bld: 6.1 % — ABNORMAL HIGH (ref 4.8–5.6)
Mean Plasma Glucose: 128.37 mg/dL

## 2024-12-12 LAB — I-STAT CG4 LACTIC ACID, ED: Lactic Acid, Venous: 1.6 mmol/L (ref 0.5–1.9)

## 2024-12-12 LAB — C-REACTIVE PROTEIN: CRP: 21.8 mg/dL — ABNORMAL HIGH

## 2024-12-12 LAB — SEDIMENTATION RATE: Sed Rate: 16 mm/h (ref 0–22)

## 2024-12-12 LAB — URIC ACID: Uric Acid, Serum: 8.5 mg/dL — ABNORMAL HIGH (ref 2.5–7.1)

## 2024-12-12 MED ORDER — VANCOMYCIN HCL IN DEXTROSE 1-5 GM/200ML-% IV SOLN: 1000.0000 mg | Freq: Once | INTRAVENOUS | Status: DC

## 2024-12-12 MED ORDER — MORPHINE SULFATE (PF) 2 MG/ML IV SOLN
2.0000 mg | INTRAVENOUS | Status: DC | PRN
  Administered 2024-12-13: 2 mg via INTRAVENOUS
  Filled 2024-12-12: qty 1

## 2024-12-12 MED ORDER — LISINOPRIL 20 MG PO TABS
40.0000 mg | ORAL_TABLET | Freq: Every day | ORAL | Status: DC
  Filled 2024-12-12: qty 2

## 2024-12-12 MED ORDER — ACETAMINOPHEN 650 MG RE SUPP: 650.0000 mg | Freq: Four times a day (QID) | RECTAL | Status: DC | PRN

## 2024-12-12 MED ORDER — OXYCODONE-ACETAMINOPHEN 5-325 MG PO TABS
1.0000 | ORAL_TABLET | Freq: Four times a day (QID) | ORAL | Status: DC | PRN
  Administered 2024-12-12 – 2024-12-14 (×6): 1 via ORAL
  Filled 2024-12-12 (×6): qty 1

## 2024-12-12 MED ORDER — SODIUM CHLORIDE 0.9 % IV SOLN
2.0000 g | Freq: Once | INTRAVENOUS | Status: AC
  Administered 2024-12-12: 2 g via INTRAVENOUS
  Filled 2024-12-12: qty 12.5

## 2024-12-12 MED ORDER — VANCOMYCIN HCL IN DEXTROSE 1-5 GM/200ML-% IV SOLN: 1000.0000 mg | INTRAVENOUS | Status: DC

## 2024-12-12 MED ORDER — ALBUTEROL SULFATE (2.5 MG/3ML) 0.083% IN NEBU
2.5000 mg | INHALATION_SOLUTION | Freq: Four times a day (QID) | RESPIRATORY_TRACT | Status: DC | PRN
  Administered 2024-12-14: 2.5 mg via RESPIRATORY_TRACT
  Filled 2024-12-12: qty 3

## 2024-12-12 MED ORDER — VANCOMYCIN HCL 2000 MG/400ML IV SOLN
2000.0000 mg | Freq: Once | INTRAVENOUS | Status: AC
  Administered 2024-12-12: 2000 mg via INTRAVENOUS
  Filled 2024-12-12: qty 400

## 2024-12-12 MED ORDER — SODIUM CHLORIDE 0.9 % IV SOLN
2.0000 g | Freq: Two times a day (BID) | INTRAVENOUS | Status: DC
  Administered 2024-12-13 – 2024-12-14 (×3): 2 g via INTRAVENOUS
  Filled 2024-12-12 (×5): qty 12.5

## 2024-12-12 MED ORDER — ENOXAPARIN SODIUM 40 MG/0.4ML IJ SOSY
40.0000 mg | PREFILLED_SYRINGE | INTRAMUSCULAR | Status: DC
  Administered 2024-12-12 – 2024-12-13 (×2): 40 mg via SUBCUTANEOUS
  Filled 2024-12-12 (×2): qty 0.4

## 2024-12-12 MED ORDER — AMLODIPINE BESYLATE 10 MG PO TABS
10.0000 mg | ORAL_TABLET | Freq: Every day | ORAL | Status: DC
  Filled 2024-12-12: qty 1

## 2024-12-12 MED ORDER — KETOROLAC TROMETHAMINE 15 MG/ML IJ SOLN
15.0000 mg | Freq: Once | INTRAMUSCULAR | Status: AC
  Administered 2024-12-12: 15 mg via INTRAVENOUS
  Filled 2024-12-12: qty 1

## 2024-12-12 MED ORDER — ONDANSETRON HCL 4 MG/2ML IJ SOLN: 4.0000 mg | Freq: Four times a day (QID) | INTRAMUSCULAR | Status: DC | PRN

## 2024-12-12 MED ORDER — ONDANSETRON HCL 4 MG PO TABS: 4.0000 mg | ORAL_TABLET | Freq: Four times a day (QID) | ORAL | Status: DC | PRN

## 2024-12-12 MED ORDER — SENNOSIDES-DOCUSATE SODIUM 8.6-50 MG PO TABS: 2.0000 | ORAL_TABLET | Freq: Every evening | ORAL | Status: DC | PRN

## 2024-12-12 MED ORDER — MORPHINE SULFATE (PF) 2 MG/ML IV SOLN
4.0000 mg | Freq: Once | INTRAVENOUS | Status: AC
  Administered 2024-12-12: 4 mg via INTRAVENOUS
  Filled 2024-12-12: qty 2

## 2024-12-12 MED ORDER — ACETAMINOPHEN 325 MG PO TABS
650.0000 mg | ORAL_TABLET | Freq: Four times a day (QID) | ORAL | Status: DC | PRN
  Administered 2024-12-13: 650 mg via ORAL
  Filled 2024-12-12: qty 2

## 2024-12-12 MED ORDER — ROSUVASTATIN CALCIUM 5 MG PO TABS
10.0000 mg | ORAL_TABLET | Freq: Every day | ORAL | Status: DC
  Administered 2024-12-12 – 2024-12-13 (×2): 10 mg via ORAL
  Filled 2024-12-12 (×2): qty 2

## 2024-12-12 MED ORDER — SODIUM CHLORIDE 0.9% FLUSH
3.0000 mL | Freq: Two times a day (BID) | INTRAVENOUS | Status: DC
  Administered 2024-12-12 – 2024-12-13 (×4): 3 mL via INTRAVENOUS

## 2024-12-12 NOTE — ED Provider Notes (Signed)
 " [facility] Provider Note   CSN: [id number] Arrival date & time: 12/12/24  1051     Patient presents with: hand swollen and Hand Pain   Shari Obrien is a 69 y.o. female. Shari Obrien , a 69 y.o. female  was evaluated in triage.  Pt complains of left hand swelling and pain. H/O diastolic CHF, COPD, HTN, CKD, DM. States she cut her left index finger on something while she was cleaning a chicken two days ago. Progressive redness and swelling since then. No numbness, weakness, cp/sob, f/c. Seen at [facility] yesterday and referred to ED but she came today.      Prior to Admission medications  Medication Sig Start Date End Date Taking? Authorizing Provider  acetaminophen  (TYLENOL ) 650 MG CR tablet Take 650 mg by mouth every 8 (eight) hours as needed for pain. 06/24/23   [provider]  albuterol  (PROVENTIL  HFA) 108 (90 Base) MCG/ACT inhaler Inhale 2 puffs into the lungs every 4 (four) hours as needed for wheezing or shortness of breath. 08/09/22   Newlin, Enobong, MD  amLODipine  (NORVASC ) 10 MG tablet Take 1 tablet (10 mg total) by mouth daily. 08/05/22   Newlin, Enobong, MD  atorvastatin  (LIPITOR) 20 MG tablet Take 1 tablet (20 mg total) by mouth daily. 08/05/22   Newlin, Enobong, MD  Blood Glucose Monitoring Suppl (ACCU-CHEK GUIDE ME) w/Device KIT Use as directed daily before breakfast. 06/24/21   Newlin, Enobong, MD  DULoxetine  (CYMBALTA ) 60 MG capsule Take 1 capsule (60 mg total) by mouth daily. For back pain 08/05/22   Newlin, Enobong, MD  Fluticasone -Umeclidin-Vilant (TRELEGY ELLIPTA ) 100-62.5-25 MCG/ACT AEPB Inhale 1 puff into the lungs daily. Must have office visit for refills Patient taking differently: Inhale 1 puff into the lungs daily as needed (shortness of breath). Must have office visit for refills 01/19/23   Newlin, Enobong, MD  furosemide  (LASIX ) 40 MG tablet Take 1 tablet (40 mg total) by mouth daily. 08/27/22   Newlin, Enobong, MD   glucose blood (ACCU-CHEK GUIDE) test strip Use as instructed daily 06/24/21   Newlin, Enobong, MD  hydrALAZINE  (APRESOLINE ) 25 MG tablet Take 1 tablet (25 mg total) by mouth every 8 (eight) hours. 03/16/24   Ezenduka, Nkeiruka J, MD  hydrocortisone  2.5 % cream Apply topically 2 (two) times daily. Patient taking differently: Apply 1 Application topically daily as needed (itching). 05/29/19   Newlin, Enobong, MD  ipratropium-albuterol  (DUONEB) 0.5-2.5 (3) MG/3ML SOLN Take 3 mLs by nebulization every 6 (six) hours as needed. 08/27/22   Newlin, Enobong, MD  lidocaine  (LIDODERM ) 5 % Place 2 patches onto the skin daily. Remove & Discard patch within 12 hours or as directed by MD 03/17/24   Ezenduka, Nkeiruka J, MD  [Paused] lisinopril  (ZESTRIL ) 40 MG tablet Take 1 tablet (40 mg total) by mouth daily to lower blood pressure. Wait to take this until your doctor or other care provider tells you to start again. 08/27/22   Newlin, Enobong, MD  nicotine  (NICODERM CQ  - DOSED IN MG/24 HOURS) 21 mg/24hr patch Place 1 patch (21 mg total) onto the skin daily as needed (smoking cessation). 03/16/24   Ezenduka, Nkeiruka J, MD  OXYGEN  Inhale 2 L into the lungs as needed (shortness of breath).    [provider]  pantoprazole  (PROTONIX ) 40 MG tablet Take 1 tablet (40 mg total) by mouth 2 (two) times daily. 03/16/24   Ezenduka, Nkeiruka J, MD  polyethylene glycol (MIRALAX  / GLYCOLAX ) 17  g packet Take 17 g by mouth 2 (two) times daily. 03/16/24   Ezenduka, Nkeiruka J, MD  predniSONE  (DELTASONE ) 5 MG tablet Take 1 tablet (5 mg total) by mouth daily with breakfast. 12 Day Taper 6,6,5,5,4,4,3,3,2,2,1,1, 11/28/24   Petery, John, DPM  Semaglutide ,0.25 or 0.5MG /DOS, (OZEMPIC , 0.25 OR 0.5 MG/DOSE,) 2 MG/3ML SOPN INJECT 0.25MG  UNDER THE SKIN ONE TIME WEEKLY (DISCONTINUE METFORMIN ) 01/24/23   Delbert Clam, MD  senna-docusate (SENOKOT-S) 8.6-50 MG tablet Take 2 tablets by mouth 2 (two) times daily. 03/16/24   Ezenduka, Nkeiruka J, MD   [Paused] spironolactone  (ALDACTONE ) 25 MG tablet Take 1 tablet (25 mg total) by mouth daily. Please schedule PCP appointment with Dr. Newlin for more refills. Wait to take this until your doctor or other care provider tells you to start again. 04/08/23   Newlin, Enobong, MD  tiZANidine  (ZANAFLEX ) 4 MG tablet Take 1 tablet (4 mg total) by mouth every 8 (eight) hours as needed for muscle spasms. 03/16/24   Ezenduka, Nkeiruka J, MD  loratadine  (ALLERGY RELIEF) 10 MG tablet Take 1 tablet (10 mg total) by mouth daily. For nasal congestion/allergy symptoms Patient not taking: Reported on 04/04/2019 03/21/19 05/24/19  Alec House, MD  metoprolol  tartrate (LOPRESSOR ) 50 MG tablet Take 1 tablet (50 mg total) by mouth 2 (two) times daily. Patient not taking: Reported on 05/24/2019 04/04/19 05/24/19  Brien Belvie BRAVO, MD  tiotropium (SPIRIVA  HANDIHALER) 18 MCG inhalation capsule Place 1 capsule (18 mcg total) into inhaler and inhale daily. Patient not taking: Reported on 05/24/2019 03/21/19 05/24/19  Alec House, MD    Allergies: Periactin [cyproheptadine]    Review of Systems Left hand pain Updated Vital Signs BP 133/73 (BP Location: Right Arm)   Pulse (!) 110   Temp 98.7 F (37.1 C)   Resp 20   Ht 5' 2 (1.575 m)   Wt 104.3 kg   SpO2 95%   BMI 42.07 kg/m   Physical Exam  Constitutional: Patient appears well-developed and well-nourished. No distress.  HENT:  Head: Normocephalic and atraumatic.  Mouth/Throat: Oropharynx is clear and moist. No oropharyngeal exudate.  Eyes: Conjunctivae are normal. Pupils are equal, round, and reactive to light. Neck: Normal range of motion. Neck supple.  Cardiovascular: Normal rate, regular rhythm, normal heart sounds and intact distal pulses.   Pulmonary/Chest: Effort normal and breath sounds normal. No respiratory distress. No wheezes or rales.  Abdominal: Soft. Bowel sounds are normal. No distension or tenderness.  Musculoskeletal: Swelling, exquisite tenderness,  limited range of motion, redness of left hand dorsal.  No fluctuance or crepitance.  No bony tenderness or obvious effusion.  Normal sensation.  Range of motion limited by pain but able to move all fingers.  Neurological: Patient is alert and oriented.  No facial droop.  Clear speech.  Normal strength and sensation throughout.  Normal coordination. Skin: Skin is warm and dry. No diaphoresis.  Psychiatric: Normal mood and affect. Normal behavior. Judgment and thought content normal.  Nursing note and vitals reviewed.  (all labs ordered are listed, but only abnormal results are displayed) Labs Reviewed  COMPREHENSIVE METABOLIC PANEL WITH GFR - Abnormal; Notable for the following components:      Result Value   BUN 25 (*)    Creatinine, Ser 1.05 (*)    GFR, Estimated 57 (*)    All other components within normal limits  CBC WITH DIFFERENTIAL/PLATELET - Abnormal; Notable for the following components:   RBC 5.95 (*)    Hemoglobin 17.1 (*)  HCT 53.0 (*)    RDW 16.5 (*)    All other components within normal limits  C-REACTIVE PROTEIN - Abnormal; Notable for the following components:   CRP 21.8 (*)    All other components within normal limits  URIC ACID - Abnormal; Notable for the following components:   Uric Acid, Serum 8.5 (*)    All other components within normal limits  CULTURE, BLOOD (ROUTINE X 2)  CULTURE, BLOOD (ROUTINE X 2)  SEDIMENTATION RATE  I-STAT CG4 LACTIC ACID, ED    EKG: None  Radiology: DG Wrist Complete Left Result Date: 12/12/2024 CLINICAL DATA:  Left wrist pain and swelling for several days. EXAM: LEFT WRIST - COMPLETE 3+ VIEW COMPARISON:  None Available. FINDINGS: There is no evidence of fracture or dislocation. Severe degenerative changes seen involving first carpometacarpal joint. Dorsal soft tissue swelling is noted as well as swelling around the thumb suggesting possible cellulitis. IMPRESSION: Severe osteoarthritis of first carpometacarpal joint. No fracture or  dislocation. Dorsal soft tissue swelling is noted as well as swelling around the thumb suggesting possible cellulitis. Electronically Signed   By: Lynwood Landy Raddle M.D.   On: 12/12/2024 12:16   DG Hand Complete Left Result Date: 12/12/2024 CLINICAL DATA:  Left hand pain and swelling for 3 days without injury. EXAM: LEFT HAND - COMPLETE 3+ VIEW COMPARISON:  None Available. FINDINGS: There is no evidence of fracture or dislocation. Severe degenerative changes seen involving first carpometacarpal joint. Dorsal soft tissue swelling is noted suggesting possible infection or cellulitis. IMPRESSION: Severe osteoarthritis of first carpometacarpal joint. Dorsal soft tissue swelling is noted suggesting possible infection or cellulitis. Electronically Signed   By: Lynwood Landy Raddle M.D.   On: 12/12/2024 12:14     Medications Ordered in the ED  vancomycin  (VANCOREADY) IVPB 2000 mg/400 mL (2,000 mg Intravenous New Bag/Given 12/12/24 1358)  morphine  (PF) 2 MG/ML injection 4 mg (4 mg Intravenous Given 12/12/24 1144)  ceFEPIme  (MAXIPIME ) 2 g in sodium chloride  0.9 % 100 mL IVPB (0 g Intravenous Stopped 12/12/24 1353)  ketorolac  (TORADOL ) 15 MG/ML injection 15 mg (15 mg Intravenous Given 12/12/24 1356)   This patient presents to the ED with chief complaint(s) of severe left hand pain and swelling with pertinent past medical history of hypertension, CHF, diabetes, chronic kidney disease, COPD not on oxygen . The complaint involves an extensive differential diagnosis and also carries with it a high risk of complications and morbidity.    Additional history obtained from none. I have also reviewed urgent care records  The differential diagnosis includes cellulitis, gout, fracture, DVT unlikely because symptoms are not circumferential and do not extend above the wrist  The initial management included labs, x-ray, IV, morphine  and broad-spectrum antibiotics    Reassessments:  The following labs were independently  interpreted: CBC and CMP reassuring with creatinine 1.05 slightly improved from recent baseline.  CRP elevated to 21.8.  Lactate 1.6.  Sed rate is 16.  Uric acid is elevated.  Overall seem to point to possible gout though clinical picture is more consistent with cellulitis.  I independently visualized the following imaging with scope of interpretation limited to determining acute life threatening conditions related to emergency care: Left wrist and hand x-rays, which revealed soft tissue swelling concerning for cellulitis  Treatment and Reassessment: Patient still has pain after IV morphine .  Will try a low-dose of Toradol  and Tylenol  and elevation.  Given severity of pain and refractory pain, paging hospitalist for admission.  Seems to warrant IV antibiotics  and elevation and pain management overnight.  No obvious orthopedic process or neurovascular process.  Patient is amenable to admission.  Consultation: - Consulted or discussed management/test interpretation with external professional: Medicine  Consideration for admission or further workup: Needs admission due to refractory pain, impaired functional status, lives alone, multiple comorbidities, hand cellulitis can be high risk and needs IV antibiotics and monitoring for improvement.  Elevated uric acid but clinical picture seems more consistent with cellulitis which would have higher morbidity and should be treated.  No evidence of deep space infection, fluid collection, compartment syndrome, neurovascular process, systemic illness.  Discussed with medicine who will admit. Social Determinants of health: None  Final diagnoses:  Cellulitis of hand    ED Discharge Orders     None          Fredia Rosette Kirsch, MD 12/12/24 1452  "

## 2024-12-12 NOTE — ED Triage Notes (Signed)
 Pt. Stated, Shari Obrien had left hand pain and swelling for 3 days after I was cleaning a chicken and nicked my hand some , since then it started swelling and so much pain.

## 2024-12-12 NOTE — H&P (Signed)
 " History and Physical    Patient: Shari Obrien FMW:[id number] DOB: [birth year] DOA: 12/12/2024 DOS: the patient was seen and examined on 12/12/2024 PCP: Haze Kingfisher, MD  Patient coming from: Home  Chief Complaint:  Chief Complaint  Patient presents with   hand swollen   Hand Pain   HPI: Shari Obrien is a 69 y.o. female with medical history significant of hypertension, diastolic congestive heart failure, COPD, and arthritis presents with a swollen and painful hand after cutting it while preparing raw chicken.  Approximately three to four days ago, she nicked her hand while cutting raw chicken. Since then, the hand has become swollen and painful, particularly when attempting to move it. No fever or chills have been noted at home.  Tried using multiple things at home for the pain including ice, heat, NSAIDs, and tramadol  without improvement in pain.  She had went to urgent care  She denies any nausea or vomiting unless she does not eat, which causes discomfort. The patient denied having diabetes and stated that she controls her food intake.  She was seen in urgent care yesterday and referred to the emergency department.  In the emergency department patient was noted to be afebrile with heart rates 110-131, and all other vital signs maintained.  Labs noted hemoglobin 17.1, BUN 25, creatinine 1.05, uric acid 8.5, CRP 21.8, and lactic acid 1.6.  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  Patient was given ketorolac , morphine , vancomycin , and cefepime .  Review of Systems: As mentioned in the history of present illness. All other systems reviewed and are negative. Past Medical History:  Diagnosis Date   Aortic atherosclerosis    CHF (congestive heart failure) (HCC)    COPD (chronic obstructive pulmonary disease) (HCC) Dx  2013   Dyspnea    HTN (hypertension) 08/30/2013   Hypertension    Lower leg edema 06/2016   On home oxygen  therapy    Plantar fasciitis    Past Surgical History:  Procedure Laterality Date   ABDOMINAL HYSTERECTOMY     BACK SURGERY     CYST EXCISION N/A 08/19/2023   Procedure: EXCISION POSTERIOR NECK SEBACEOUS CYST;  Surgeon: Vernetta Berg, MD;  Location: [facility];  Service: General;  Laterality: N/A;   Social History:  reports that she has been smoking cigarettes. She has a 19 pack-year smoking history. She has never used smokeless tobacco. She reports that she does not drink alcohol  and does not use drugs.  Allergies[1]  Family History  Problem Relation Age of Onset   Hypertension Maternal Grandmother     Prior to Admission medications  Medication Sig Start Date End Date Taking? Authorizing Provider  acetaminophen  (TYLENOL ) 650 MG CR tablet Take 650 mg by mouth every 8 (eight) hours as needed for pain. 06/24/23   [provider]  albuterol  (PROVENTIL  HFA) 108 (90 Base) MCG/ACT inhaler Inhale 2 puffs into the lungs every 4 (four) hours as needed for wheezing or shortness of breath. 08/09/22   Newlin, Enobong, MD  amLODipine  (NORVASC ) 10 MG tablet Take 1 tablet (10 mg total) by mouth daily. 08/05/22   Newlin, Enobong, MD  atorvastatin  (LIPITOR) 20 MG tablet Take 1 tablet (20 mg total) by mouth daily. 08/05/22   Newlin, Enobong, MD  Blood Glucose Monitoring Suppl (ACCU-CHEK GUIDE ME) w/Device KIT Use as directed daily before breakfast. 06/24/21   Newlin,  Enobong, MD  DULoxetine  (CYMBALTA ) 60 MG capsule Take 1 capsule (60 mg total) by mouth daily. For back pain 08/05/22   Newlin, Enobong, MD  Fluticasone -Umeclidin-Vilant (TRELEGY ELLIPTA ) 100-62.5-25 MCG/ACT AEPB Inhale 1 puff into the lungs daily. Must have office visit for refills Patient taking differently: Inhale 1 puff into the lungs daily as needed (shortness of breath). Must have office visit for refills 01/19/23   Newlin,  Enobong, MD  furosemide  (LASIX ) 40 MG tablet Take 1 tablet (40 mg total) by mouth daily. 08/27/22   Newlin, Enobong, MD  glucose blood (ACCU-CHEK GUIDE) test strip Use as instructed daily 06/24/21   Newlin, Enobong, MD  hydrALAZINE  (APRESOLINE ) 25 MG tablet Take 1 tablet (25 mg total) by mouth every 8 (eight) hours. 03/16/24   Ezenduka, Nkeiruka J, MD  hydrocortisone  2.5 % cream Apply topically 2 (two) times daily. Patient taking differently: Apply 1 Application topically daily as needed (itching). 05/29/19   Newlin, Enobong, MD  ipratropium-albuterol  (DUONEB) 0.5-2.5 (3) MG/3ML SOLN Take 3 mLs by nebulization every 6 (six) hours as needed. 08/27/22   Newlin, Enobong, MD  lidocaine  (LIDODERM ) 5 % Place 2 patches onto the skin daily. Remove & Discard patch within 12 hours or as directed by MD 03/17/24   Ezenduka, Nkeiruka J, MD  [Paused] lisinopril  (ZESTRIL ) 40 MG tablet Take 1 tablet (40 mg total) by mouth daily to lower blood pressure. Wait to take this until your doctor or other care provider tells you to start again. 08/27/22   Newlin, Enobong, MD  nicotine  (NICODERM CQ  - DOSED IN MG/24 HOURS) 21 mg/24hr patch Place 1 patch (21 mg total) onto the skin daily as needed (smoking cessation). 03/16/24   Ezenduka, Nkeiruka J, MD  OXYGEN  Inhale 2 L into the lungs as needed (shortness of breath).    [provider]  pantoprazole  (PROTONIX ) 40 MG tablet Take 1 tablet (40 mg total) by mouth 2 (two) times daily. 03/16/24   Ezenduka, Nkeiruka J, MD  polyethylene glycol (MIRALAX  / GLYCOLAX ) 17 g packet Take 17 g by mouth 2 (two) times daily. 03/16/24   Ezenduka, Nkeiruka J, MD  predniSONE  (DELTASONE ) 5 MG tablet Take 1 tablet (5 mg total) by mouth daily with breakfast. 12 Day Taper 6,6,5,5,4,4,3,3,2,2,1,1, 11/28/24   Petery, John, DPM  Semaglutide ,0.25 or 0.5MG /DOS, (OZEMPIC , 0.25 OR 0.5 MG/DOSE,) 2 MG/3ML SOPN INJECT 0.25MG  UNDER THE SKIN ONE TIME WEEKLY (DISCONTINUE METFORMIN ) 01/24/23   Delbert Clam, MD   senna-docusate (SENOKOT-S) 8.6-50 MG tablet Take 2 tablets by mouth 2 (two) times daily. 03/16/24   Ezenduka, Nkeiruka J, MD  [Paused] spironolactone  (ALDACTONE ) 25 MG tablet Take 1 tablet (25 mg total) by mouth daily. Please schedule PCP appointment with Dr. Newlin for more refills. Wait to take this until your doctor or other care provider tells you to start again. 04/08/23   Newlin, Enobong, MD  tiZANidine  (ZANAFLEX ) 4 MG tablet Take 1 tablet (4 mg total) by mouth every 8 (eight) hours as needed for muscle spasms. 03/16/24   Ezenduka, Nkeiruka J, MD  loratadine  (ALLERGY RELIEF) 10 MG tablet Take 1 tablet (10 mg total) by mouth daily. For nasal congestion/allergy symptoms Patient not taking: Reported on 04/04/2019 03/21/19 05/24/19  Alec House, MD  metoprolol  tartrate (LOPRESSOR ) 50 MG tablet Take 1 tablet (50 mg total) by mouth 2 (two) times daily. Patient not taking: Reported on 05/24/2019 04/04/19 05/24/19  Brien Belvie BRAVO, MD  tiotropium (SPIRIVA  HANDIHALER) 18 MCG inhalation capsule Place 1 capsule (18 mcg total) into inhaler  and inhale daily. Patient not taking: Reported on 05/24/2019 03/21/19 05/24/19  Alec House, MD    Physical Exam: Vitals:   12/12/24 1056 12/12/24 1105  BP: 133/73   Pulse: (!) 110   Resp: 20   Temp: 98.7 F (37.1 C)   SpO2: 95%   Weight:  104.3 kg  Height:  5' 2 (1.575 m)   Constitutional:  Elderly  female, NAD, calm, comfortable Eyes: PERRL, lids and conjunctivae normal ENMT: Mucous membranes are moist. Posterior pharynx clear of any exudate or lesions.Normal dentition.  Neck: normal, supple  Respiratory: clear to auscultation bilaterally, no wheezing, no crackles. Normal respiratory effort.   Cardiovascular: Regular rate and rhythm, no murmurs / rubs / gallops.  2+ pedal pulses. No carotid bruits.  Abdomen: no tenderness, no masses palpated.  Bowel sounds positive.  Musculoskeletal: no clubbing / cyanosis. No joint deformity upper and lower extremities. Good ROM,  no contractures. Normal muscle tone.  Skin: Small healed laceration noted to the medial side of the left middle finger with erythema and swelling present  Neurologic: CN 2-12 grossly intact.   Strength 5/5 in all 4.  Psychiatric: Normal judgment and insight. Alert and oriented x 3. Normal mood.   Data Reviewed:  Reviewed labs, imaging, and pertinent records as documented.  Assessment and Plan: Suspected cellulitis of the left hand Patient presents with swelling and pain of the right hand after cutting her hand while cooking raw chicken.  CRP elevated at 21.8, ESR was within normal limits at 16, and uric acid 8.2. Severe osteoarthritis of first carpometacarpal joint. Dorsal soft tissue swelling is noted suggesting possible infection or cellulitis. Blood cultures were obtained.  Suspected possible cellulitis of the hand related recently nicking him and while cooking raw chicken.  On the differential includes the possibility of gout given elevated - Admit to a telemetry bed - Follow-up blood culture - Continue empiric antibiotics of vancomycin  and cefepime  - Oxycodone /morphine  IV for moderate to severe pain  Essential hypertension Blood pressures currently maintained but she was noted to be tachycardic. - Continue amlodipine  and lisinopril  - Held diuretic given tachycardia  Diastolic congestive heart failure Patient appears to be relatively euvolemic at this time.  Last echocardiogram noted EF to be 65 to 70% with grade 2 diastolic dysfunction. - Strict I&O's and daily weights - Hold furosemide  assess and determine when medically appropriate to resume  Controlled diabetes mellitus type 2 Patient reports that her diabetes is diet controlled.  On admission glucose noted to be 94. - Check hemoglobin A1c - Continue to monitor  Chronic kidney disease stage IIIa Creatinine noted to be 1.05 with BUN 25.  Creatinine appears improved from prior check. - Continue to monitor kidney  function  Polycythemia Chronic.  Hemoglobin noted to be elevated at 17.1. - Continue to monitor  COPD No significant wheezing noted on physical exam at this time. - Albuterol  nebs as need   Hyperlipidemia - Continue Crestor   Morbid obesity BMI 42.07 kg/m  DVT prophylaxis: Lovenox  Advance Care Planning:   Code Status: Full Code    Consults: None  Family Communication: None  Severity of Illness: The appropriate patient status for this patient is OBSERVATION. Observation status is judged to be reasonable and necessary in order to provide the required intensity of service to ensure the patient's safety. The patient's presenting symptoms, physical exam findings, and initial radiographic and laboratory data in the context of their medical condition is felt to place them at decreased risk for further clinical  deterioration. Furthermore, it is anticipated that the patient will be medically stable for discharge from the hospital within 2 midnights of admission.   Author: Maximino DELENA Sharps, MD 12/12/2024 2:40 PM  For on call review [email or web address].      [1]  Allergies Allergen Reactions   Periactin [Cyproheptadine] Swelling   "

## 2024-12-12 NOTE — Plan of Care (Signed)
  Problem: Clinical Measurements: Goal: Ability to avoid or minimize complications of infection will improve Outcome: Progressing   Problem: Skin Integrity: Goal: Skin integrity will improve Outcome: Progressing   Problem: Education: Goal: Knowledge of General Education information will improve Description: Including pain rating scale, medication(s)/side effects and non-pharmacologic comfort measures Outcome: Progressing   Problem: Health Behavior/Discharge Planning: Goal: Ability to manage health-related needs will improve Outcome: Progressing   Problem: Clinical Measurements: Goal: Ability to maintain clinical measurements within normal limits will improve Outcome: Progressing Goal: Will remain free from infection Outcome: Progressing Goal: Diagnostic test results will improve Outcome: Progressing Goal: Respiratory complications will improve Outcome: Progressing Goal: Cardiovascular complication will be avoided Outcome: Progressing   Problem: Activity: Goal: Risk for activity intolerance will decrease Outcome: Progressing   Problem: Nutrition: Goal: Adequate nutrition will be maintained Outcome: Progressing   Problem: Coping: Goal: Level of anxiety will decrease Outcome: Progressing   Problem: Elimination: Goal: Will not experience complications related to bowel motility Outcome: Progressing Goal: Will not experience complications related to urinary retention Outcome: Progressing   Problem: Pain Managment: Goal: General experience of comfort will improve and/or be controlled Outcome: Progressing   Problem: Safety: Goal: Ability to remain free from injury will improve Outcome: Progressing   Problem: Skin Integrity: Goal: Risk for impaired skin integrity will decrease Outcome: Progressing

## 2024-12-12 NOTE — ED Notes (Signed)
 Many attempts made to obtain 2nd set of cultures unable to obtain

## 2024-12-12 NOTE — ED Notes (Signed)
 Phlebotomist to obtain blood cultures and labs

## 2024-12-12 NOTE — Progress Notes (Signed)
 Pharmacy Antibiotic Note  Shari Obrien is a 69 y.o. female for which pharmacy has been consulted for cefepime  and vancomycin  dosing for cellulitis.  Patient with a history of HTN, HF, COPD. Patient presenting with swollen hand w/ pain.  SCr 1.05 WBC 9.5; LA 1.6; T 99.1; HR 112; RR 14  Plan: Cefepime  2g q12hr  Vancomycin  2000 mg once then 1000 mg q24hr (eAUC 510.1) unless change in renal function Monitor WBC, fever, renal function, cultures De-escalate when able Levels at steady state  Height: 5' 2 (157.5 cm) Weight: 104.3 kg (230 lb) IBW/kg (Calculated) : 50.1  Temp (24hrs), Avg:98.9 F (37.2 C), Min:98.7 F (37.1 C), Max:99.1 F (37.3 C)  Recent Labs  Lab 12/12/24 1217 12/12/24 1220  WBC 9.5  --   CREATININE 1.05*  --   LATICACIDVEN  --  1.6    Estimated Creatinine Clearance: 57.3 mL/min (A) (by C-G formula based on SCr of 1.05 mg/dL (H)).    Allergies[1]  Microbiology results: Pending  Thank you for allowing pharmacy to be a part of this patients care.  Dorn Buttner, PharmD, BCPS 12/12/2024 3:30 PM ED Clinical Pharmacist -  [phone]       [1]  Allergies Allergen Reactions   Periactin [Cyproheptadine] Swelling

## 2024-12-12 NOTE — ED Provider Triage Note (Signed)
 Emergency Medicine Provider Triage Evaluation Note  BATUL DIEGO , a 69 y.o. female  was evaluated in triage.  Pt complains of left hand swelling and pain. H/O diastolic CHF, COPD, HTN, CKD, DM. States she cut her left index finger on something while she was cleaning a chicken two days ago. Progressive redness and swelling since then. No numbness, weakness, cp/sob, f/c. Seen at [facility] yesterday and referred to ED but she came today.  Review of Systems  Positive: Left hand pain and swelling Negative: Chest pain  Physical Exam  BP 133/73 (BP Location: Right Arm)   Pulse (!) 110   Temp 98.7 F (37.1 C)   Resp 20   SpO2 95%  Gen:   Awake, no distress   Resp:  Normal effort  MSK:   Redness and swelling left hand with exquisite tenderness of wrist and dorsal hand. No bony point tenderness though limited exam due to pain. No crepitance or fluctuance. Pulses intact. Normal sensation. Moves all fingers but limited by pain. Other:    Medical Decision Making  Medically screening exam initiated at 11:00 AM.  Appropriate orders placed.  ANNELIE BOAK was informed that the remainder of the evaluation will be completed by another provider, this initial triage assessment does not replace that evaluation, and the importance of remaining in the ED until their evaluation is complete.    Venesa Semidey Hima, MD 12/12/24 [phone]

## 2024-12-12 NOTE — ED Provider Notes (Signed)
 " [facility]  [facility] Provider Note   CSN: [id number] Arrival date & time: 12/12/24  1051     Patient presents with: hand swollen and Hand Pain   SHANEA KARNEY is a 69 y.o. female. Sharlet JINNY Bones , a 69 y.o. female  was evaluated in triage.  Pt complains of left hand swelling and pain. H/O diastolic CHF, COPD, HTN, CKD, DM. States she cut her left index finger on something while she was cleaning a chicken two days ago. Progressive redness and swelling since then. No numbness, weakness, cp/sob, f/c. Seen at [facility] yesterday and referred to ED but she came today.      Prior to Admission medications  Medication Sig Start Date End Date Taking? Authorizing Provider  acetaminophen  (TYLENOL ) 650 MG CR tablet Take 650 mg by mouth every 8 (eight) hours as needed for pain. 06/24/23  Yes [provider]  albuterol  (PROVENTIL  HFA) 108 (90 Base) MCG/ACT inhaler Inhale 2 puffs into the lungs every 4 (four) hours as needed for wheezing or shortness of breath. 08/09/22  Yes Newlin, Enobong, MD  amLODipine  (NORVASC ) 10 MG tablet Take 1 tablet (10 mg total) by mouth daily. 08/05/22  Yes Newlin, Enobong, MD  atorvastatin  (LIPITOR) 20 MG tablet Take 1 tablet (20 mg total) by mouth daily. 08/05/22  Yes Newlin, Enobong, MD  Fluticasone -Umeclidin-Vilant (TRELEGY ELLIPTA ) 100-62.5-25 MCG/ACT AEPB Inhale 1 puff into the lungs daily. Must have office visit for refills Patient taking differently: Inhale 1 puff into the lungs daily as needed (shortness of breath). Must have office visit for refills 01/19/23  Yes Delbert Clam, MD  furosemide  (LASIX ) 40 MG tablet Take 1 tablet (40 mg total) by mouth daily. 08/27/22  Yes Newlin, Enobong, MD  [Paused] lisinopril  (ZESTRIL ) 40 MG tablet Take 1 tablet (40 mg total) by mouth daily to lower blood pressure. Wait to take this until your doctor or other care provider tells you to start again. 08/27/22  Yes Newlin, Enobong, MD  rosuvastatin  (CRESTOR )  10 MG tablet Take 10 mg by mouth at bedtime. 11/26/24  Yes [provider]  traMADol  (ULTRAM ) 50 MG tablet Take 50 mg by mouth 2 (two) times daily as needed for moderate pain (pain score 4-6) or severe pain (pain score 7-10).   Yes [provider]  BESIVANCE 0.6 % SUSP Apply 1 drop to eye 4 (four) times daily. Patient not taking: Reported on 12/13/2024 11/01/24   [provider]  Difluprednate 0.05 % EMUL Apply 1 drop to eye 4 (four) times daily. Patient not taking: Reported on 12/13/2024 11/01/24   [provider]  DULoxetine  (CYMBALTA ) 60 MG capsule Take 1 capsule (60 mg total) by mouth daily. For back pain Patient not taking: Reported on 12/13/2024 08/05/22   Newlin, Enobong, MD  hydrALAZINE  (APRESOLINE ) 25 MG tablet Take 1 tablet (25 mg total) by mouth every 8 (eight) hours. Patient not taking: Reported on 12/13/2024 03/16/24   Ezenduka, Nkeiruka J, MD  hydrocortisone  2.5 % cream Apply topically 2 (two) times daily. Patient not taking: Reported on 12/13/2024 05/29/19   Newlin, Enobong, MD  ipratropium-albuterol  (DUONEB) 0.5-2.5 (3) MG/3ML SOLN Take 3 mLs by nebulization every 6 (six) hours as needed. Patient not taking: Reported on 12/13/2024 08/27/22   Newlin, Enobong, MD  lidocaine  (LIDODERM ) 5 % Place 2 patches onto the skin daily. Remove & Discard patch within 12 hours or as directed by MD Patient not taking: Reported on 12/13/2024 03/17/24   Ezenduka, Nkeiruka  J, MD  nicotine  (NICODERM CQ  - DOSED IN MG/24 HOURS) 21 mg/24hr patch Place 1 patch (21 mg total) onto the skin daily as needed (smoking cessation). Patient not taking: Reported on 12/13/2024 03/16/24   Ezenduka, Nkeiruka J, MD  pantoprazole  (PROTONIX ) 40 MG tablet Take 1 tablet (40 mg total) by mouth 2 (two) times daily. Patient not taking: Reported on 12/13/2024 03/16/24   Ezenduka, Nkeiruka J, MD  polyethylene glycol (MIRALAX  / GLYCOLAX ) 17 g packet Take 17 g by mouth 2 (two) times daily. Patient not taking:  Reported on 12/13/2024 03/16/24   Ezenduka, Nkeiruka J, MD  predniSONE  (DELTASONE ) 5 MG tablet Take 1 tablet (5 mg total) by mouth daily with breakfast. 12 Day Taper 6,6,5,5,4,4,3,3,2,2,1,1, Patient not taking: Reported on 12/13/2024 11/28/24   Christine Rush, DPM  PROLENSA 0.07 % SOLN Apply 1 drop to eye 2 (two) times daily. Patient not taking: Reported on 12/13/2024 11/01/24   [provider]  Semaglutide ,0.25 or 0.5MG /DOS, (OZEMPIC , 0.25 OR 0.5 MG/DOSE,) 2 MG/3ML SOPN INJECT 0.25MG  UNDER THE SKIN ONE TIME WEEKLY (DISCONTINUE METFORMIN ) Patient not taking: Reported on 12/13/2024 01/24/23   Newlin, Enobong, MD  senna-docusate (SENOKOT-S) 8.6-50 MG tablet Take 2 tablets by mouth 2 (two) times daily. Patient not taking: Reported on 12/13/2024 03/16/24   Ezenduka, Nkeiruka J, MD  [Paused] spironolactone  (ALDACTONE ) 25 MG tablet Take 1 tablet (25 mg total) by mouth daily. Please schedule PCP appointment with Dr. Newlin for more refills. Patient not taking: Reported on 12/13/2024 Wait to take this until your doctor or other care provider tells you to start again. 04/08/23   Newlin, Enobong, MD  tiZANidine  (ZANAFLEX ) 4 MG tablet Take 1 tablet (4 mg total) by mouth every 8 (eight) hours as needed for muscle spasms. 03/16/24   Ezenduka, Nkeiruka J, MD  loratadine  (ALLERGY RELIEF) 10 MG tablet Take 1 tablet (10 mg total) by mouth daily. For nasal congestion/allergy symptoms Patient not taking: Reported on 04/04/2019 03/21/19 05/24/19  Alec House, MD  metoprolol  tartrate (LOPRESSOR ) 50 MG tablet Take 1 tablet (50 mg total) by mouth 2 (two) times daily. Patient not taking: Reported on 05/24/2019 04/04/19 05/24/19  Brien Belvie BRAVO, MD  tiotropium (SPIRIVA  HANDIHALER) 18 MCG inhalation capsule Place 1 capsule (18 mcg total) into inhaler and inhale daily. Patient not taking: Reported on 05/24/2019 03/21/19 05/24/19  Alec House, MD    Allergies: Periactin [cyproheptadine]    Review of Systems  Updated Vital Signs BP  130/80   Pulse 92   Temp 98.4 F (36.9 C) (Oral)   Resp 18   Ht 5' 2 (1.575 m)   Wt 99.1 kg   SpO2 97%   BMI 39.96 kg/m   Physical Exam  (all labs ordered are listed, but only abnormal results are displayed) Labs Reviewed  SURGICAL PCR SCREEN - Abnormal; Notable for the following components:      Result Value   Staphylococcus aureus POSITIVE (*)    All other components within normal limits  COMPREHENSIVE METABOLIC PANEL WITH GFR - Abnormal; Notable for the following components:   BUN 25 (*)    Creatinine, Ser 1.05 (*)    GFR, Estimated 57 (*)    All other components within normal limits  CBC WITH DIFFERENTIAL/PLATELET - Abnormal; Notable for the following components:   RBC 5.95 (*)    Hemoglobin 17.1 (*)    HCT 53.0 (*)    RDW 16.5 (*)    All other components within normal limits  C-REACTIVE PROTEIN - Abnormal;  Notable for the following components:   CRP 21.8 (*)    All other components within normal limits  URIC ACID - Abnormal; Notable for the following components:   Uric Acid, Serum 8.5 (*)    All other components within normal limits  HEMOGLOBIN A1C - Abnormal; Notable for the following components:   Hgb A1c MFr Bld 6.1 (*)    All other components within normal limits  CBC - Abnormal; Notable for the following components:   WBC 11.4 (*)    RBC 5.53 (*)    Hemoglobin 15.7 (*)    HCT 49.3 (*)    RDW 15.9 (*)    All other components within normal limits  BASIC METABOLIC PANEL WITH GFR - Abnormal; Notable for the following components:   BUN 31 (*)    Creatinine, Ser 1.20 (*)    GFR, Estimated 49 (*)    All other components within normal limits  URINALYSIS, ROUTINE W REFLEX MICROSCOPIC - Abnormal; Notable for the following components:   Ketones, ur 5 (*)    All other components within normal limits  CULTURE, BLOOD (ROUTINE X 2)  CULTURE, BLOOD (ROUTINE X 2)  AEROBIC/ANAEROBIC CULTURE W GRAM STAIN (SURGICAL/DEEP WOUND)  SEDIMENTATION RATE  CBC WITH  DIFFERENTIAL/PLATELET  BASIC METABOLIC PANEL WITH GFR  I-STAT CG4 LACTIC ACID, ED    EKG: None  Radiology: DG Wrist Complete Left Result Date: 12/12/2024 CLINICAL DATA:  Left wrist pain and swelling for several days. EXAM: LEFT WRIST - COMPLETE 3+ VIEW COMPARISON:  None Available. FINDINGS: There is no evidence of fracture or dislocation. Severe degenerative changes seen involving first carpometacarpal joint. Dorsal soft tissue swelling is noted as well as swelling around the thumb suggesting possible cellulitis. IMPRESSION: Severe osteoarthritis of first carpometacarpal joint. No fracture or dislocation. Dorsal soft tissue swelling is noted as well as swelling around the thumb suggesting possible cellulitis. Electronically Signed   By: Lynwood Landy Raddle M.D.   On: 12/12/2024 12:16   DG Hand Complete Left Result Date: 12/12/2024 CLINICAL DATA:  Left hand pain and swelling for 3 days without injury. EXAM: LEFT HAND - COMPLETE 3+ VIEW COMPARISON:  None Available. FINDINGS: There is no evidence of fracture or dislocation. Severe degenerative changes seen involving first carpometacarpal joint. Dorsal soft tissue swelling is noted suggesting possible infection or cellulitis. IMPRESSION: Severe osteoarthritis of first carpometacarpal joint. Dorsal soft tissue swelling is noted suggesting possible infection or cellulitis. Electronically Signed   By: Lynwood Landy Raddle M.D.   On: 12/12/2024 12:14     Procedures   Medications Ordered in the ED  sodium chloride  flush (NS) 0.9 % injection 3 mL (3 mLs Intravenous Given 12/13/24 2247)  acetaminophen  (TYLENOL ) tablet 650 mg ( Oral MAR Unhold 12/13/24 2147)    Or  acetaminophen  (TYLENOL ) suppository 650 mg ( Rectal MAR Unhold 12/13/24 2147)  ondansetron  (ZOFRAN ) tablet 4 mg ( Oral MAR Unhold 12/13/24 2147)    Or  ondansetron  (ZOFRAN ) injection 4 mg ( Intravenous MAR Unhold 12/13/24 2147)  albuterol  (PROVENTIL ) (2.5 MG/3ML) 0.083% nebulizer solution 2.5 mg (  Nebulization MAR Unhold 12/13/24 2147)  oxyCODONE -acetaminophen  (PERCOCET/ROXICET) 5-325 MG per tablet 1 tablet (1 tablet Oral Given 12/14/24 0546)  ceFEPIme  (MAXIPIME ) 2 g in sodium chloride  0.9 % 100 mL IVPB (2 g Intravenous New Bag/Given 12/14/24 0119)  amLODipine  (NORVASC ) tablet 10 mg ( Oral MAR Unhold 12/13/24 2147)  rosuvastatin  (CRESTOR ) tablet 10 mg (10 mg Oral Given 12/13/24 2237)  lisinopril  (ZESTRIL ) tablet 40 mg (  Oral MAR Unhold 12/13/24 2147)  senna-docusate (Senokot-S) tablet 2 tablet ( Oral MAR Unhold 12/13/24 2147)  vancomycin  (VANCOREADY) IVPB 1750 mg/350 mL (1,750 mg Intravenous New Bag/Given 12/13/24 2238)  DULoxetine  (CYMBALTA ) DR capsule 60 mg ( Oral MAR Unhold 12/13/24 2147)  pantoprazole  (PROTONIX ) EC tablet 40 mg (40 mg Oral Given 12/13/24 2237)  polyethylene glycol (MIRALAX  / GLYCOLAX ) packet 17 g (17 g Oral Given 12/13/24 2247)  tiZANidine  (ZANAFLEX ) tablet 4 mg (4 mg Oral Given 12/14/24 0546)  HYDROmorphone  (DILAUDID ) injection 0.5 mg ( Intravenous MAR Unhold 12/13/24 2147)  mupirocin  ointment (BACTROBAN ) 2 % 1 Application (1 Application Nasal Given 12/13/24 2238)  Chlorhexidine  Gluconate Cloth 2 % PADS 6 each (has no administration in time range)  ascorbic acid (VITAMIN C ) tablet 1,000 mg (has no administration in time range)  enoxaparin  (LOVENOX ) injection 50 mg (has no administration in time range)  morphine  (PF) 2 MG/ML injection 4 mg (4 mg Intravenous Given 12/12/24 1144)  ceFEPIme  (MAXIPIME ) 2 g in sodium chloride  0.9 % 100 mL IVPB (0 g Intravenous Stopped 12/12/24 1353)  vancomycin  (VANCOREADY) IVPB 2000 mg/400 mL (0 mg Intravenous Stopped 12/12/24 1644)  ketorolac  (TORADOL ) 15 MG/ML injection 15 mg (15 mg Intravenous Given 12/12/24 1356)  chlorhexidine  (PERIDEX ) 0.12 % solution 15 mL (15 mLs Mouth/Throat Given 12/13/24 1812)    Or  Oral care mouth rinse ( Mouth Rinse See Alternative 12/13/24 1812)   This patient presents to the ED with chief complaint(s) of left hand redness  and swelling with pertinent past medical history of hypertension, diabetes, CHF, chronic kidney disease. The complaint involves an extensive differential diagnosis and also carries with it a high risk of complications and morbidity.    Additional history obtained from none. I have also reviewed previous admission  The differential diagnosis includes cellulitis, deep space infection, DVT, inflammatory arthropathy  The initial management included labs, analgesia, broad-spectrum antibiotics    Reassessments:  The following labs were independently interpreted: Basic labs are reassuring but elevated CRP suggesting inflammation.  Lactate 1.6 I do not suspect sepsis.  I independently visualized the following imaging with scope of interpretation limited to determining acute life threatening conditions related to emergency care: Left hand and wrist x-rays, which revealed soft tissue swelling  Treatment and Reassessment: Given IV morphine  and still having pain.  Tried a small dose of IV Toradol  which has helped minimally as well.  Consultation: - Consulted or discussed management/test interpretation with external professional: Medicine  Consideration for admission or further workup: Paresthesia warrants admission.  Intractable pain of left hand and wrist.  This occurred after cutting her left finger while cleaning chickens so cellulitis seems high on the differential.  However no fever or leukocytosis.  I have treated cellulitis with broad-spectrum antibiotics.  Could also be inflammatory joint disorder or gout given elevated uric acid.  Hesitate to start specific treatment for gout given chronic kidney disease, CHF, cellulitis seems more likely.  Seems reasonable to admit and treat as cellulitis and monitor for improvement.  No obvious evidence of deep space infection but this is in the differential as well and if patient does not improve on antibiotics should be considered.  Also given intractable pain  and patient lives alone and is having impaired functional status due to pain.  Discussed with medicine who will admit.  Social Determinants of health: None  Final diagnoses:  Cellulitis of hand    ED Discharge Orders     None  Haillee Johann Hima, MD 12/14/24 [phone]  "

## 2024-12-13 ENCOUNTER — Inpatient Hospital Stay (HOSPITAL_COMMUNITY): Admitting: Anesthesiology

## 2024-12-13 ENCOUNTER — Encounter (HOSPITAL_COMMUNITY)
Admission: EM | Disposition: A | Discharge status: Home / Self Care | Payer: Self-pay | Source: Home / Self Care | Attending: Family Medicine

## 2024-12-13 ENCOUNTER — Encounter (HOSPITAL_COMMUNITY): Payer: Self-pay | Admitting: Internal Medicine

## 2024-12-13 DIAGNOSIS — I129 Hypertensive chronic kidney disease with stage 1 through stage 4 chronic kidney disease, or unspecified chronic kidney disease: Secondary | ICD-10-CM

## 2024-12-13 DIAGNOSIS — N183 Chronic kidney disease, stage 3 unspecified: Secondary | ICD-10-CM | POA: Diagnosis not present

## 2024-12-13 DIAGNOSIS — F17210 Nicotine dependence, cigarettes, uncomplicated: Secondary | ICD-10-CM | POA: Diagnosis present

## 2024-12-13 DIAGNOSIS — D751 Secondary polycythemia: Secondary | ICD-10-CM | POA: Diagnosis present

## 2024-12-13 DIAGNOSIS — I130 Hypertensive heart and chronic kidney disease with heart failure and stage 1 through stage 4 chronic kidney disease, or unspecified chronic kidney disease: Secondary | ICD-10-CM | POA: Diagnosis present

## 2024-12-13 DIAGNOSIS — Z6841 Body Mass Index (BMI) 40.0 and over, adult: Secondary | ICD-10-CM | POA: Diagnosis not present

## 2024-12-13 DIAGNOSIS — L03114 Cellulitis of left upper limb: Secondary | ICD-10-CM | POA: Diagnosis present

## 2024-12-13 DIAGNOSIS — Z7985 Long-term (current) use of injectable non-insulin antidiabetic drugs: Secondary | ICD-10-CM | POA: Diagnosis not present

## 2024-12-13 DIAGNOSIS — Z79899 Other long term (current) drug therapy: Secondary | ICD-10-CM | POA: Diagnosis not present

## 2024-12-13 DIAGNOSIS — L03119 Cellulitis of unspecified part of limb: Secondary | ICD-10-CM | POA: Diagnosis not present

## 2024-12-13 DIAGNOSIS — E1122 Type 2 diabetes mellitus with diabetic chronic kidney disease: Secondary | ICD-10-CM | POA: Diagnosis present

## 2024-12-13 DIAGNOSIS — Z8249 Family history of ischemic heart disease and other diseases of the circulatory system: Secondary | ICD-10-CM | POA: Diagnosis not present

## 2024-12-13 DIAGNOSIS — J449 Chronic obstructive pulmonary disease, unspecified: Secondary | ICD-10-CM | POA: Diagnosis present

## 2024-12-13 DIAGNOSIS — S61201A Unspecified open wound of left index finger without damage to nail, initial encounter: Secondary | ICD-10-CM | POA: Diagnosis not present

## 2024-12-13 DIAGNOSIS — Z90710 Acquired absence of both cervix and uterus: Secondary | ICD-10-CM | POA: Diagnosis not present

## 2024-12-13 DIAGNOSIS — N1831 Chronic kidney disease, stage 3a: Secondary | ICD-10-CM | POA: Diagnosis present

## 2024-12-13 DIAGNOSIS — I5032 Chronic diastolic (congestive) heart failure: Secondary | ICD-10-CM | POA: Diagnosis present

## 2024-12-13 DIAGNOSIS — E66813 Obesity, class 3: Secondary | ICD-10-CM | POA: Diagnosis present

## 2024-12-13 DIAGNOSIS — Z9981 Dependence on supplemental oxygen: Secondary | ICD-10-CM | POA: Diagnosis not present

## 2024-12-13 DIAGNOSIS — M1812 Unilateral primary osteoarthritis of first carpometacarpal joint, left hand: Secondary | ICD-10-CM | POA: Diagnosis present

## 2024-12-13 DIAGNOSIS — E785 Hyperlipidemia, unspecified: Secondary | ICD-10-CM | POA: Diagnosis present

## 2024-12-13 LAB — URINALYSIS, ROUTINE W REFLEX MICROSCOPIC
Bilirubin Urine: NEGATIVE
Glucose, UA: NEGATIVE mg/dL
Hgb urine dipstick: NEGATIVE
Ketones, ur: 5 mg/dL — AB
Leukocytes,Ua: NEGATIVE
Nitrite: NEGATIVE
Protein, ur: NEGATIVE mg/dL
Specific Gravity, Urine: 1.029 (ref 1.005–1.030)
pH: 5 (ref 5.0–8.0)

## 2024-12-13 LAB — CBC
HCT: 49.3 % — ABNORMAL HIGH (ref 36.0–46.0)
Hemoglobin: 15.7 g/dL — ABNORMAL HIGH (ref 12.0–15.0)
MCH: 28.4 pg (ref 26.0–34.0)
MCHC: 31.8 g/dL (ref 30.0–36.0)
MCV: 89.2 fL (ref 80.0–100.0)
Platelets: 248 K/uL (ref 150–400)
RBC: 5.53 MIL/uL — ABNORMAL HIGH (ref 3.87–5.11)
RDW: 15.9 % — ABNORMAL HIGH (ref 11.5–15.5)
WBC: 11.4 K/uL — ABNORMAL HIGH (ref 4.0–10.5)
nRBC: 0 % (ref 0.0–0.2)

## 2024-12-13 LAB — BASIC METABOLIC PANEL WITH GFR
Anion gap: 10 (ref 5–15)
BUN: 31 mg/dL — ABNORMAL HIGH (ref 8–23)
CO2: 25 mmol/L (ref 22–32)
Calcium: 9.2 mg/dL (ref 8.9–10.3)
Chloride: 103 mmol/L (ref 98–111)
Creatinine, Ser: 1.2 mg/dL — ABNORMAL HIGH (ref 0.44–1.00)
GFR, Estimated: 49 mL/min — ABNORMAL LOW
Glucose, Bld: 94 mg/dL (ref 70–99)
Potassium: 4.5 mmol/L (ref 3.5–5.1)
Sodium: 138 mmol/L (ref 135–145)

## 2024-12-13 LAB — SURGICAL PCR SCREEN
MRSA, PCR: NEGATIVE
Staphylococcus aureus: POSITIVE — AB

## 2024-12-13 MED ORDER — PHENYLEPHRINE HCL (PRESSORS) 10 MG/ML IV SOLN
INTRAVENOUS | Status: DC | PRN
  Administered 2024-12-13: 160 ug via INTRAVENOUS

## 2024-12-13 MED ORDER — LACTATED RINGERS IV SOLN: INTRAVENOUS | Status: DC

## 2024-12-13 MED ORDER — 0.9 % SODIUM CHLORIDE (POUR BTL) OPTIME
TOPICAL | Status: DC | PRN
  Administered 2024-12-13: 1000 mL

## 2024-12-13 MED ORDER — BUPIVACAINE HCL (PF) 0.25 % IJ SOLN
INTRAMUSCULAR | Status: AC
  Filled 2024-12-13: qty 30

## 2024-12-13 MED ORDER — MUPIROCIN 2 % EX OINT
1.0000 | TOPICAL_OINTMENT | Freq: Two times a day (BID) | CUTANEOUS | Status: DC
  Administered 2024-12-13 – 2024-12-14 (×2): 1 via NASAL
  Filled 2024-12-13 (×2): qty 22

## 2024-12-13 MED ORDER — VITAMIN C 500 MG PO TABS: 1000.0000 mg | ORAL_TABLET | Freq: Every day | ORAL | Status: DC

## 2024-12-13 MED ORDER — MIDAZOLAM HCL 5 MG/5ML IJ SOLN
INTRAMUSCULAR | Status: DC | PRN
  Administered 2024-12-13: 2 mg via INTRAVENOUS

## 2024-12-13 MED ORDER — ONDANSETRON HCL 4 MG/2ML IJ SOLN
INTRAMUSCULAR | Status: DC | PRN
  Administered 2024-12-13: 4 mg via INTRAVENOUS

## 2024-12-13 MED ORDER — PHENYLEPHRINE HCL-NACL 20-0.9 MG/250ML-% IV SOLN
INTRAVENOUS | Status: DC | PRN
  Administered 2024-12-13: 40 ug/min via INTRAVENOUS

## 2024-12-13 MED ORDER — VANCOMYCIN HCL 1750 MG/350ML IV SOLN
1750.0000 mg | INTRAVENOUS | Status: DC
  Administered 2024-12-13: 1750 mg via INTRAVENOUS
  Filled 2024-12-13: qty 350

## 2024-12-13 MED ORDER — ACETAMINOPHEN 10 MG/ML IV SOLN
INTRAVENOUS | Status: AC
  Filled 2024-12-13: qty 100

## 2024-12-13 MED ORDER — CHLORHEXIDINE GLUCONATE 4 % EX SOLN
60.0000 mL | Freq: Once | CUTANEOUS | Status: DC
  Filled 2024-12-13: qty 60

## 2024-12-13 MED ORDER — CEFAZOLIN SODIUM-DEXTROSE 2-4 GM/100ML-% IV SOLN: 2.0000 g | INTRAVENOUS | Status: DC

## 2024-12-13 MED ORDER — POLYETHYLENE GLYCOL 3350 17 G PO PACK
17.0000 g | PACK | Freq: Two times a day (BID) | ORAL | Status: DC
  Administered 2024-12-13: 17 g via ORAL
  Filled 2024-12-13: qty 1

## 2024-12-13 MED ORDER — PROPOFOL 10 MG/ML IV BOLUS
INTRAVENOUS | Status: DC | PRN
  Administered 2024-12-13: 150 mg via INTRAVENOUS

## 2024-12-13 MED ORDER — PROPOFOL 10 MG/ML IV BOLUS
INTRAVENOUS | Status: AC
  Filled 2024-12-13: qty 20

## 2024-12-13 MED ORDER — LACTATED RINGERS IV SOLN: INTRAVENOUS | Status: DC | PRN

## 2024-12-13 MED ORDER — PANTOPRAZOLE SODIUM 40 MG PO TBEC
40.0000 mg | DELAYED_RELEASE_TABLET | Freq: Two times a day (BID) | ORAL | Status: DC
  Administered 2024-12-13 – 2024-12-14 (×2): 40 mg via ORAL
  Filled 2024-12-13 (×2): qty 1

## 2024-12-13 MED ORDER — FENTANYL CITRATE (PF) 250 MCG/5ML IJ SOLN
INTRAMUSCULAR | Status: AC
  Filled 2024-12-13: qty 5

## 2024-12-13 MED ORDER — TIZANIDINE HCL 4 MG PO TABS
4.0000 mg | ORAL_TABLET | Freq: Three times a day (TID) | ORAL | Status: DC | PRN
  Administered 2024-12-14: 4 mg via ORAL
  Filled 2024-12-13: qty 1

## 2024-12-13 MED ORDER — CHLORHEXIDINE GLUCONATE CLOTH 2 % EX PADS
6.0000 | MEDICATED_PAD | Freq: Every day | CUTANEOUS | Status: DC
  Administered 2024-12-14: 6 via TOPICAL

## 2024-12-13 MED ORDER — BUPIVACAINE HCL (PF) 0.25 % IJ SOLN
INTRAMUSCULAR | Status: DC | PRN
  Administered 2024-12-13: 10 mL

## 2024-12-13 MED ORDER — FENTANYL CITRATE (PF) 100 MCG/2ML IJ SOLN: 25.0000 ug | INTRAMUSCULAR | Status: DC | PRN

## 2024-12-13 MED ORDER — CHLORHEXIDINE GLUCONATE 0.12 % MT SOLN: 15.0000 mL | Freq: Once | OROMUCOSAL | Status: AC

## 2024-12-13 MED ORDER — HYDROMORPHONE HCL 1 MG/ML IJ SOLN: 0.5000 mg | INTRAMUSCULAR | Status: DC | PRN

## 2024-12-13 MED ORDER — ORAL CARE MOUTH RINSE: 15.0000 mL | Freq: Once | OROMUCOSAL | Status: AC

## 2024-12-13 MED ORDER — CHLORHEXIDINE GLUCONATE 0.12 % MT SOLN
OROMUCOSAL | Status: AC
  Administered 2024-12-13: 15 mL via OROMUCOSAL
  Filled 2024-12-13: qty 15

## 2024-12-13 MED ORDER — POVIDONE-IODINE 10 % EX SWAB: 2.0000 | Freq: Once | CUTANEOUS | Status: DC

## 2024-12-13 MED ORDER — FENTANYL CITRATE (PF) 250 MCG/5ML IJ SOLN
INTRAMUSCULAR | Status: DC | PRN
  Administered 2024-12-13 (×2): 50 ug via INTRAVENOUS

## 2024-12-13 MED ORDER — DULOXETINE HCL 60 MG PO CPEP
60.0000 mg | ORAL_CAPSULE | Freq: Every day | ORAL | Status: DC
  Administered 2024-12-13 – 2024-12-14 (×2): 60 mg via ORAL
  Filled 2024-12-13 (×2): qty 1

## 2024-12-13 MED ORDER — LIDOCAINE HCL (CARDIAC) PF 100 MG/5ML IV SOSY
PREFILLED_SYRINGE | INTRAVENOUS | Status: DC | PRN
  Administered 2024-12-13: 60 mg via INTRATRACHEAL

## 2024-12-13 MED ORDER — MIDAZOLAM HCL 2 MG/2ML IJ SOLN
INTRAMUSCULAR | Status: AC
  Filled 2024-12-13: qty 2

## 2024-12-13 NOTE — Transfer of Care (Signed)
 Immediate Anesthesia Transfer of Care Note  Patient: Shari Obrien  Procedure(s) Performed: IRRIGATION AND DEBRIDEMENT LEFT INDEX FINGER AND THUMB (Left: Hand)  Patient Location: PACU  Anesthesia Type:General  Level of Consciousness: drowsy  Airway & Oxygen  Therapy: Patient Spontanous Breathing and Patient connected to nasal cannula oxygen   Post-op Assessment: Report given to RN and Post -op Vital signs reviewed and stable  Post vital signs: Reviewed and stable  Last Vitals:  Vitals Value Taken Time  BP 141/84 12/13/24 20:49  Temp    Pulse 100 12/13/24 20:51  Resp 25 12/13/24 20:51  SpO2 96 % 12/13/24 20:51  Vitals shown include unfiled device data.  Last Pain:  Vitals:   12/13/24 1800  TempSrc: Oral  PainSc: 5       Patients Stated Pain Goal: 2 (12/13/24 1800)  Complications: No notable events documented.

## 2024-12-13 NOTE — Progress Notes (Signed)
 Postop note: Status post incision and drainage left hand including index finger flexor sheath and dorsum of thumb.  No purulence noted.  Scant thin fluid.  Cultures taken.  Start local wound care in 2 to 4 days.  Okay for DVT prophylaxis.  Okay for discharge home when afebrile, white blood count trending to normal, pain controlled.

## 2024-12-13 NOTE — Anesthesia Procedure Notes (Signed)
 Procedure Name: LMA Insertion Date/Time: 12/13/2024 8:01 PM  Performed by: Mylo Asberry BIRCH, CRNAPre-anesthesia Checklist: Patient identified, Emergency Drugs available, Suction available and Patient being monitored Patient Re-evaluated:Patient Re-evaluated prior to induction Oxygen  Delivery Method: Circle system utilized Preoxygenation: Pre-oxygenation with 100% oxygen  Induction Type: IV induction Ventilation: Mask ventilation without difficulty LMA: LMA inserted LMA Size: 4.0 Tube type: Oral Number of attempts: 1 Placement Confirmation: positive ETCO2 and breath sounds checked- equal and bilateral Tube secured with: Tape Dental Injury: Teeth and Oropharynx as per pre-operative assessment

## 2024-12-13 NOTE — Consult Note (Addendum)
 Reason for Consult:Left hand infection Referring Physician: Fredia Skeeter Time called: 1030 Time at bedside: 1104   Shari Obrien is an 69 y.o. female.  HPI: Laneya cut her left hand cutting chicken 3-4d ago. She began to have increased pain and swelling as well as fevers, chills, and sweats. She came to the ED and was admitted but failed to improve overnight on abx and hand surgery was consulted the following morning. She is RHD and not working.  Past Medical History:  Diagnosis Date   Aortic atherosclerosis    CHF (congestive heart failure) (HCC)    COPD (chronic obstructive pulmonary disease) (HCC) Dx 2013   Dyspnea    HTN (hypertension) 08/30/2013   Hypertension    Lower leg edema 06/2016   On home oxygen  therapy    Plantar fasciitis     Past Surgical History:  Procedure Laterality Date   ABDOMINAL HYSTERECTOMY     BACK SURGERY     CYST EXCISION N/A 08/19/2023   Procedure: EXCISION POSTERIOR NECK SEBACEOUS CYST;  Surgeon: Vernetta Berg, MD;  Location: [facility];  Service: General;  Laterality: N/A;    Family History  Problem Relation Age of Onset   Hypertension Maternal Grandmother     Social History:  reports that she has been smoking cigarettes. She has a 19 pack-year smoking history. She has never used smokeless tobacco. She reports that she does not drink alcohol  and does not use drugs.  Allergies: Allergies[1]  Medications: I have reviewed the patient's current medications.  Results for orders placed or performed during the hospital encounter of 12/12/24 (from the past 48 hours)  Comprehensive metabolic panel     Status: Abnormal   Collection Time: 12/12/24 12:17 PM  Result Value Ref Range   Sodium 138 135 - 145 mmol/L   Potassium 4.5 3.5 - 5.1 mmol/L    Comment: HEMOLYSIS AT THIS LEVEL MAY AFFECT RESULT   Chloride 102 98 - 111 mmol/L   CO2 22 22 - 32 mmol/L   Glucose, Bld 94 70 - 99 mg/dL    Comment: Glucose reference range applies only to samples taken  after fasting for at least 8 hours.   BUN 25 (H) 8 - 23 mg/dL   Creatinine, Ser 8.94 (H) 0.44 - 1.00 mg/dL   Calcium  9.2 8.9 - 10.3 mg/dL   Total Protein 7.5 6.5 - 8.1 g/dL   Albumin 3.9 3.5 - 5.0 g/dL   AST 22 15 - 41 U/L    Comment: HEMOLYSIS AT THIS LEVEL MAY AFFECT RESULT   ALT 16 0 - 44 U/L   Alkaline Phosphatase 87 38 - 126 U/L   Total Bilirubin 1.0 0.0 - 1.2 mg/dL   GFR, Estimated 57 (L) >60 mL/min    Comment: (NOTE) Calculated using the CKD-EPI Creatinine Equation (2021)    Anion gap 14 5 - 15    Comment: Performed at [facility], [address], [place], [place] [address]  CBC with Differential     Status: Abnormal   Collection Time: 12/12/24 12:17 PM  Result Value Ref Range   WBC 9.5 4.0 - 10.5 K/uL   RBC 5.95 (H) 3.87 - 5.11 MIL/uL   Hemoglobin 17.1 (H) 12.0 - 15.0 g/dL   HCT 46.9 (H) 63.9 - 53.9 %   MCV 89.1 80.0 - 100.0 fL   MCH 28.7 26.0 - 34.0 pg   MCHC 32.3 30.0 - 36.0 g/dL   RDW 83.4 (H) 88.4 - 84.4 %  Platelets 202 150 - 400 K/uL    Comment: REPEATED TO VERIFY PLATELET COUNT CONFIRMED BY SMEAR    nRBC 0.0 0.0 - 0.2 %   Neutrophils Relative % 74 %   Neutro Abs 6.9 1.7 - 7.7 K/uL   Lymphocytes Relative 15 %   Lymphs Abs 1.5 0.7 - 4.0 K/uL   Monocytes Relative 10 %   Monocytes Absolute 1.0 0.1 - 1.0 K/uL   Eosinophils Relative 0 %   Eosinophils Absolute 0.0 0.0 - 0.5 K/uL   Basophils Relative 0 %   Basophils Absolute 0.0 0.0 - 0.1 K/uL   WBC Morphology MORPHOLOGY UNREMARKABLE    RBC Morphology MORPHOLOGY UNREMARKABLE    Smear Review Normal platelet morphology    Immature Granulocytes 1 %   Abs Immature Granulocytes 0.06 0.00 - 0.07 K/uL    Comment: Performed at [facility], [address], [place], [place] [address]  Culture, blood (routine x 2)     Status: None (Preliminary result)   Collection Time: 12/12/24 12:17 PM   Specimen: BLOOD RIGHT ARM  Result Value Ref Range   Specimen Description BLOOD RIGHT ARM    Special Requests       BOTTLES DRAWN AEROBIC ONLY Blood Culture results may not be optimal due to an inadequate volume of blood received in culture bottles   Culture      NO GROWTH < 24 HOURS Performed at [facility], [address] [place], [place] [address]    Report Status PENDING   Sedimentation rate     Status: None   Collection Time: 12/12/24 12:17 PM  Result Value Ref Range   Sed Rate 16 0 - 22 mm/hr    Comment: Performed at [facility], [address], [place], [place] [address]  C-reactive protein     Status: Abnormal   Collection Time: 12/12/24 12:17 PM  Result Value Ref Range   CRP 21.8 (H) <1.0 mg/dL    Comment: Performed at [facility], [address], [place], [place] [address]  Uric acid     Status: Abnormal   Collection Time: 12/12/24 12:17 PM  Result Value Ref Range   Uric Acid, Serum 8.5 (H) 2.5 - 7.1 mg/dL    Comment: Performed at [facility], [address] [place], [place] [address]  I-Stat Lactic Acid     Status: None   Collection Time: 12/12/24 12:20 PM  Result Value Ref Range   Lactic Acid, Venous 1.6 0.5 - 1.9 mmol/L  Hemoglobin A1c     Status: Abnormal   Collection Time: 12/12/24  6:25 PM  Result Value Ref Range   Hgb A1c MFr Bld 6.1 (H) 4.8 - 5.6 %    Comment: (NOTE) Diagnosis of Diabetes The following HbA1c ranges recommended by the American Diabetes Association (ADA) may be used as an aid in the diagnosis of diabetes mellitus.  Hemoglobin             Suggested A1C NGSP%              Diagnosis  <5.7                   Non Diabetic  5.7-6.4                Pre-Diabetic  >6.4                   Diabetic  <7.0  Glycemic control for                       adults with diabetes.     Mean Plasma Glucose 128.37 mg/dL    Comment: Performed at [facility], [address], [place], [place] [address]  Culture, blood (routine x 2)     Status: None (Preliminary result)   Collection Time: 12/12/24  6:26 PM   Specimen: BLOOD  RIGHT ARM  Result Value Ref Range   Specimen Description BLOOD RIGHT ARM    Special Requests      BOTTLES DRAWN AEROBIC AND ANAEROBIC Blood Culture adequate volume   Culture      NO GROWTH < 12 HOURS Performed at [facility], [address] [place], [place] [address]    Report Status PENDING   CBC     Status: Abnormal   Collection Time: 12/13/24  6:25 AM  Result Value Ref Range   WBC 11.4 (H) 4.0 - 10.5 K/uL   RBC 5.53 (H) 3.87 - 5.11 MIL/uL   Hemoglobin 15.7 (H) 12.0 - 15.0 g/dL   HCT 50.6 (H) 63.9 - 53.9 %   MCV 89.2 80.0 - 100.0 fL   MCH 28.4 26.0 - 34.0 pg   MCHC 31.8 30.0 - 36.0 g/dL   RDW 84.0 (H) 88.4 - 84.4 %   Platelets 248 150 - 400 K/uL   nRBC 0.0 0.0 - 0.2 %    Comment: Performed at [facility], [address], [place], [place] [address]  Basic metabolic panel     Status: Abnormal   Collection Time: 12/13/24  6:25 AM  Result Value Ref Range   Sodium 138 135 - 145 mmol/L   Potassium 4.5 3.5 - 5.1 mmol/L   Chloride 103 98 - 111 mmol/L   CO2 25 22 - 32 mmol/L   Glucose, Bld 94 70 - 99 mg/dL    Comment: Glucose reference range applies only to samples taken after fasting for at least 8 hours.   BUN 31 (H) 8 - 23 mg/dL   Creatinine, Ser 8.79 (H) 0.44 - 1.00 mg/dL   Calcium  9.2 8.9 - 10.3 mg/dL   GFR, Estimated 49 (L) >60 mL/min    Comment: (NOTE) Calculated using the CKD-EPI Creatinine Equation (2021)    Anion gap 10 5 - 15    Comment: Performed at [facility], [address], [place], [place] [address]    DG Wrist Complete Left Result Date: 12/12/2024 CLINICAL DATA:  Left wrist pain and swelling for several days. EXAM: LEFT WRIST - COMPLETE 3+ VIEW COMPARISON:  None Available. FINDINGS: There is no evidence of fracture or dislocation. Severe degenerative changes seen involving first carpometacarpal joint. Dorsal soft tissue swelling is noted as well as swelling around the thumb suggesting possible cellulitis. IMPRESSION: Severe osteoarthritis  of first carpometacarpal joint. No fracture or dislocation. Dorsal soft tissue swelling is noted as well as swelling around the thumb suggesting possible cellulitis. Electronically Signed   By: Lynwood Landy Raddle M.D.   On: 12/12/2024 12:16   DG Hand Complete Left Result Date: 12/12/2024 CLINICAL DATA:  Left hand pain and swelling for 3 days without injury. EXAM: LEFT HAND - COMPLETE 3+ VIEW COMPARISON:  None Available. FINDINGS: There is no evidence of fracture or dislocation. Severe degenerative changes seen involving first carpometacarpal joint. Dorsal soft tissue swelling is noted suggesting possible infection or cellulitis. IMPRESSION: Severe osteoarthritis of first  carpometacarpal joint. Dorsal soft tissue swelling is noted suggesting possible infection or cellulitis. Electronically Signed   By: Lynwood Landy Raddle M.D.   On: 12/12/2024 12:14    Review of Systems  Constitutional:  Positive for chills, diaphoresis and fever.  HENT:  Negative for ear discharge, ear pain, hearing loss and tinnitus.   Eyes:  Negative for photophobia and pain.  Respiratory:  Negative for cough and shortness of breath.   Cardiovascular:  Negative for chest pain.  Gastrointestinal:  Negative for abdominal pain, nausea and vomiting.  Genitourinary:  Negative for dysuria, flank pain, frequency and urgency.  Musculoskeletal:  Positive for arthralgias (Left index finger/hand/wrist). Negative for back pain, myalgias and neck pain.  Neurological:  Negative for dizziness and headaches.  Hematological:  Does not bruise/bleed easily.  Psychiatric/Behavioral:  The patient is not nervous/anxious.    Blood pressure (!) 117/53, pulse 96, temperature 98 F (36.7 C), temperature source Oral, resp. rate 17, height 5' 2 (1.575 m), weight 98.6 kg, SpO2 95%. Physical Exam Constitutional:      General: She is not in acute distress.    Appearance: She is well-developed. She is not diaphoretic.  HENT:     Head: Normocephalic and  atraumatic.  Eyes:     General: No scleral icterus.       Right eye: No discharge.        Left eye: No discharge.     Conjunctiva/sclera: Conjunctivae normal.  Cardiovascular:     Rate and Rhythm: Normal rate and regular rhythm.  Pulmonary:     Effort: Pulmonary effort is normal. No respiratory distress.  Musculoskeletal:     Cervical back: Normal range of motion.     Comments: Left shoulder, elbow, wrist, digits- no skin wounds, exquisite TTP index finger to distal FA beginning P2, fusiform edema P1, finger held flexed, severe pain with PROM, no instability, no blocks to motion  Sens  Ax/R/M/U intact  Mot   Ax/ R/ PIN/ M/ AIN/ U intact  Rad 2+  Skin:    General: Skin is warm and dry.  Neurological:     Mental Status: She is alert.  Psychiatric:        Mood and Affect: Mood normal.        Behavior: Behavior normal.     Assessment/Plan: Left hand infection -- Plan I&D this afternoon with Dr. Murrell. Please keep NPO.    Ozell DOROTHA Ned, PA-C Orthopedic Surgery [phone] 12/13/2024, 11:11 AM   Addendum (12/13/2024): Patient seen and examined.  Agree with above. History: Patient states she was cutting chicken 3 to 4 days ago and nicked her index finger as well as thumb.  She has had progressively worsening swelling and pain of the hand.  She was admitted yesterday for IV antibiotics but has not had significant improvement. Exam: Intact sensation capillary refill in the fingertips.  She has a small wound on the radial side of the index finger over the middle phalanx.  She is tender to palpation throughout the hand but especially at the index finger and thumb dorsally near the MP joint.  She states she also had a small wound at the radial side of the thumb.  There is erythema on the dorsum of the thumb.  She has pain with motion of the index finger.  No proximal streaking.  Mild edema throughout the hand. A/P: Left index finger and thumb infections possibly including the flexor  tendon sheath of the index finger.  Recommend incision and drainage  left hand including index finger and thumb and possibly flexor sheath.  Risks, benefits and alternatives of surgery were discussed including risks of blood loss, infection, damage to nerves/vessels/tendons/ligament/bone, failure of surgery, need for additional surgery, complication with wound healing, stiffness, need for repeat irrigation and debridement, amputation.  She voiced understanding of these risks and elected to proceed.      [1]  Allergies Allergen Reactions   Periactin [Cyproheptadine] Swelling

## 2024-12-13 NOTE — Anesthesia Postprocedure Evaluation (Signed)
"   Anesthesia Post Note  Patient: Shari Obrien  Procedure(s) Performed: IRRIGATION AND DEBRIDEMENT LEFT INDEX FINGER AND THUMB (Left: Hand)     Patient location during evaluation: PACU Anesthesia Type: General Level of consciousness: awake and alert Pain management: pain level controlled Vital Signs Assessment: post-procedure vital signs reviewed and stable Respiratory status: spontaneous breathing, nonlabored ventilation, respiratory function stable and patient connected to nasal cannula oxygen  Cardiovascular status: blood pressure returned to baseline and stable Postop Assessment: no apparent nausea or vomiting Anesthetic complications: no   No notable events documented.  Last Vitals:  Vitals:   12/13/24 2115 12/13/24 2130  BP: (!) 142/78   Pulse: 97 93  Resp: 19 17  Temp:  36.8 C  SpO2: 93% 93%    Last Pain:  Vitals:   12/13/24 2130  TempSrc:   PainSc: Asleep                 Mikylah Ackroyd,W. EDMOND      "

## 2024-12-13 NOTE — Discharge Instructions (Signed)

## 2024-12-13 NOTE — Progress Notes (Signed)
 Pharmacy Antibiotic Note  Shari Obrien is a 69 y.o. female for which pharmacy has been consulted for cefepime  and vancomycin  dosing for cellulitis.  Patient with a history of HTN, HF, COPD. Patient presenting with swollen hand w/ pain.  Increase in SCr 1.2   Plan: Continue Cefepime  2g q12hr  Change Vancomycin  to 1750 mg q36hr (eAUC 491) unless change in renal function Monitor WBC, fever, renal function, cultures De-escalate when able Levels at steady state  Height: 5' 2 (157.5 cm) Weight: 98.6 kg (217 lb 6 oz) IBW/kg (Calculated) : 50.1  Temp (24hrs), Avg:98.8 F (37.1 C), Min:98 F (36.7 C), Max:99.6 F (37.6 C)  Recent Labs  Lab 12/12/24 1217 12/12/24 1220 12/13/24 0625  WBC 9.5  --  11.4*  CREATININE 1.05*  --  1.20*  LATICACIDVEN  --  1.6  --     Estimated Creatinine Clearance: 48.5 mL/min (A) (by C-G formula based on SCr of 1.2 mg/dL (H)).    Allergies[1]  Microbiology results: Pending  Thank you for allowing pharmacy to be a part of this patients care.  Donny Alert, PharmD, [facility] Clinical Pharmacist Please see AMION for all Pharmacists' Contact Phone Numbers 12/13/2024, 8:17 AM          [1]  Allergies Allergen Reactions   Periactin [Cyproheptadine] Swelling

## 2024-12-13 NOTE — Anesthesia Preprocedure Evaluation (Addendum)
"                                    Anesthesia Evaluation  Patient identified by MRN, date of birth, ID band Patient awake    Reviewed: Allergy & Precautions, H&P , NPO status , Patient's Chart, lab work & pertinent test results  Airway Mallampati: II  TM Distance: >3 FB Neck ROM: Full    Dental no notable dental hx. (+) Teeth Intact, Dental Advisory Given   Pulmonary shortness of breath and with exertion, COPD,  COPD inhaler and oxygen  dependent, Current Smoker and Patient abstained from smoking.   Pulmonary exam normal breath sounds clear to auscultation       Cardiovascular hypertension, Pt. on medications +CHF   Rhythm:Regular Rate:Normal     Neuro/Psych negative neurological ROS  negative psych ROS   GI/Hepatic negative GI ROS, Neg liver ROS,,,  Endo/Other  diabetes, Type 2  Class 3 obesity  Renal/GU Renal InsufficiencyRenal disease  negative genitourinary   Musculoskeletal   Abdominal   Peds  Hematology negative hematology ROS (+)   Anesthesia Other Findings   Reproductive/Obstetrics negative OB ROS                              Anesthesia Physical Anesthesia Plan  ASA: 3  Anesthesia Plan: General   Post-op Pain Management: Tylenol  PO (pre-op)*   Induction: Intravenous  PONV Risk Score and Plan: 3 and Ondansetron , Dexamethasone  and Treatment may vary due to age or medical condition  Airway Management Planned: LMA  Additional Equipment:   Intra-op Plan:   Post-operative Plan: Extubation in OR  Informed Consent: I have reviewed the patients History and Physical, chart, labs and discussed the procedure including the risks, benefits and alternatives for the proposed anesthesia with the patient or authorized representative who has indicated his/her understanding and acceptance.     Dental advisory given  Plan Discussed with: CRNA  Anesthesia Plan Comments:          Anesthesia Quick Evaluation  "

## 2024-12-13 NOTE — Op Note (Signed)
 NAME: Shari Obrien MEDICAL RECORD NO: [id number] DATE OF BIRTH: June 23, 1956 FACILITY: [facility] LOCATION: [facility] OR PHYSICIAN: Shari Milstein R. Ronav Furney, MD   OPERATIVE REPORT   DATE OF PROCEDURE: 12/13/24    PREOPERATIVE DIAGNOSIS: Left index finger and thumb infections   POSTOPERATIVE DIAGNOSIS: Left index finger and thumb infection   PROCEDURE: 1.  Left index finger incision and drainage flexor tendon sheath 2.  Left thumb incision and drainage of subcutaneous infection   SURGEON:  Shari Obrien, M.D.   ASSISTANT: none   ANESTHESIA:  General   INTRAVENOUS FLUIDS:  Per anesthesia flow sheet.   ESTIMATED BLOOD LOSS:  Minimal.   COMPLICATIONS:  None.   SPECIMENS: Cultures to micro   TOURNIQUET TIME:    Total Tourniquet Time Documented: Forearm (Left) - 39 minutes Total: Forearm (Left) - 39 minutes    DISPOSITION:  Stable to PACU.   INDICATIONS: 69 year old female states that proximately 4 days ago she was cutting chicken and cut her index finger and thumb.  She has had progressive swelling and pain in the hand since.  She has been admitted for IV antibiotics without significant improvement.  Her white blood count is increased.  She has tenderness along the flexor sheath of the index finger and along the dorsum of the thumb.  She has a wound at the radial side of the index finger middle phalanx and at the radial side of the thumb near the MP joint.  She wishes to proceed with incision and drainage of the left hand in the operating room.  Risks, benefits and alternatives of surgery were discussed including the risks of blood loss, infection, damage to nerves, vessels, tendons, ligaments, bone for surgery, need for additional surgery, complications with wound healing, continued pain, stiffness, , need for repeat irrigation and debridement.  She voiced understanding of these risks and elected to proceed.  OPERATIVE COURSE:  After being identified preoperatively by myself,  the patient and I  agreed on the procedure and site of the procedure.  The surgical site was marked.  Surgical consent had been signed.  She is on scheduled antibiotics.  She was transferred to the operating room and placed on the operating table in supine position with the left upper extremity on an arm board.  General anesthesia was induced by the anesthesiologist.  Left upper extremity was prepped and draped in normal sterile orthopedic fashion.  A surgical pause was performed between the surgeons, anesthesia, and operating room staff and all were in agreement as to the patient, procedure, and site of procedure.  Tourniquet at the proximal aspect of the forearm was inflated to 250 mmHg after exsanguination of the arm with an Esmarch bandage.  Incision was made over the wound at the radial side of the index finger middle phalanx.  There was cloudy fluid within.  No gross purulence.  Subcutaneous tissues were spread.  Incision was made at the volar aspect of the MP joint of the index finger.  The A1 pulley was identified and incised.  There is a small amount of fluid within the sheath.  There was no purulence.  Incision was then made at the volar aspect of the distal phalanx of the index finger and the sheath accessed in this location as well.  The wounds were swabbed for cultures.  An incision was made at the radial side of the thumb at the noted wound.  This was carried in subcutaneous tissues by spreading technique.  There was no gross purulence.  Conocophillips  of the radial sensory nerve was at this area.  The wound did not appear to coursed through the extensor hood.  There was whitish material at the extensor tendon consistent with previous steroid injection.  A #5 pediatric feeding tube was advanced into the flexor sheath from proximally.  This was used to irrigate the flexor sheath.  Effluent was obtained from proximal and distal.  Some small bits of white material were noted within the irrigant.  The sheath and wounds were all  copiously irrigated with sterile saline.  Bipolar electrocautery was used to obtain hemostasis.  They were packed with quarter inch to form gauze.  Digital block was performed to the index finger and injection around the wound and the thumb with quarter percent plain Marcaine  to aid in postoperative analgesia.  The wounds were dressed with sterile 4 x 4's and wrapped with a Kerlix bandage.  Volar splint was placed and wrapped with Kerlix and Ace bandage.  The tourniquet was deflated at 39 minutes.  Fingertips were pink with brisk capillary refill after deflation of tourniquet.  The operative  drapes were broken down.  The patient was awoken from anesthesia safely.  She was transferred back to the stretcher and taken to PACU in stable condition.  She is admitted to the hospitalist for IV antibiotics.  Start local wound care in 3 to 4 days.   Shari Montalvo, MD Electronically signed, 12/13/24

## 2024-12-13 NOTE — Care Management Obs Status (Signed)
 MEDICARE OBSERVATION STATUS NOTIFICATION   Patient Details  Name: Shari Obrien MRN: [id number] Date of Birth: [birth year]   Medicare Observation Status Notification Given:  Yes    Jennie Laneta Dragon 12/13/2024, 9:30 AM

## 2024-12-13 NOTE — Progress Notes (Signed)
 Patient ate breakfast (Omelet and fruit) at 0700 this morning.    Report received from Taiwo, RN at [phone].

## 2024-12-13 NOTE — Progress Notes (Signed)
 " PROGRESS NOTE    Shari Obrien  FMW:[id number] DOB: [birth year] DOA: 12/12/2024 PCP: Haze Kingfisher, MD   Brief Narrative:  Shari Obrien is a 69 y.o. female with medical history significant of hypertension, diastolic congestive heart failure, COPD, and arthritis presented with a swollen and painful hand after cutting it while preparing/cutting raw chicken 3 to 4 days ago.  Pain gets worse with any movement of the hand/fingers.  No fever or chills have been noted at home.  Tried using multiple things at home for the pain including ice, heat, NSAIDs, and tramadol  without improvement in pain.  She went to urgent care  In the emergency department patient was noted to be afebrile with heart rates 110-131, and all other vital signs maintained.  Labs noted hemoglobin 17.1, BUN 25, creatinine 1.05, uric acid 8.5, CRP 21.8, and lactic acid 1.6.  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  Patient was given ketorolac , morphine , vancomycin , and cefepime .  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  X-ray negative for fracture, confirms cellulitis.  Patient still with severe pain, screams with any soft touch, unable to flex fingers.  I am concerned about deep infection.  Although I doubt osteomyelitis however due to the sensitive nature of vital organ such as hand, I will  proceed with MRI of the left hand stat.  I have consulted orthopedics and have sent a message to Ozell Purchase with orthopedics.  Continue cefepime  and vancomycin  and follow blood culture which is negative to date.  Essential hypertension: Controlled, continue amlodipine  and lisinopril .  Holding hydralazine  for now as blood pressure on low normal side.  Chronic diastolic congestive heart failure: Appears euvolemic.  Holding diuretic for now.  Type 2 diabetes mellitus: Diet controlled.  Hemoglobin A1c 7.0 about a year ago but now 6.1.  Blood sugar 94 this morning.  Does not need SSI.  Chronic kidney disease stage IIIa Creatinine noted to be 1.05 with BUN 25.  Creatinine appears improved from prior check.   Polycythemia Chronic.  Hemoglobin noted to be elevated at 17.1. - Continue to monitor   COPD No significant wheezing noted on physical exam at this time. - Albuterol  nebs as need    Hyperlipidemia - Continue Crestor    Morbid obesity BMI 42.07 kg/m.  Weight loss and diet modification counseled.  DVT prophylaxis: enoxaparin  (LOVENOX ) injection 40 mg Start: 12/12/24 2200   Code Status: Full Code  Family Communication:  None present at bedside.  Plan of care discussed with patient in length and he/she verbalized understanding and agreed with it.  Status is: Observation The patient will require care spanning > 2 midnights and should be moved to inpatient because: Still with significant left hand pain and limited range of motion of the fingers.   Estimated body mass index is 39.76 kg/m as calculated from the following:   Height as of this encounter: 5' 2 (  1.575 m).   Weight as of this encounter: 98.6 kg.    Nutritional Assessment: Body mass index is 39.76 kg/m.SABRA Seen by dietician.  I agree with the assessment and plan as outlined below: Nutrition Status:        . Skin Assessment: I have examined the patient's skin and I agree with the wound assessment as performed by  the wound care RN as outlined below:    Consultants:  Orthopedics  Procedures:  None  Antimicrobials:  Anti-infectives (From admission, onward)    Start     Dose/Rate Route Frequency Ordered Stop   12/13/24 2300  vancomycin  (VANCOREADY) IVPB 1750 mg/350 mL        1,750 mg 175 mL/hr over 120 Minutes Intravenous Every 36 hours 12/13/24 0816     12/13/24 1400  vancomycin  (VANCOCIN ) IVPB 1000 mg/200 mL premix  Status:  Discontinued        1,000 mg 200 mL/hr over 60 Minutes Intravenous Every 24 hours 12/12/24 1625 12/13/24 0816   12/13/24 0100  ceFEPIme  (MAXIPIME ) 2 g in sodium chloride  0.9 % 100 mL IVPB        2 g 200 mL/hr over 30 Minutes Intravenous Every 12 hours 12/12/24 1625     12/12/24 1130  vancomycin  (VANCOREADY) IVPB 2000 mg/400 mL        2,000 mg 200 mL/hr over 120 Minutes Intravenous  Once 12/12/24 1119 12/12/24 1644   12/12/24 1115  vancomycin  (VANCOCIN ) IVPB 1000 mg/200 mL premix  Status:  Discontinued        1,000 mg 200 mL/hr over 60 Minutes Intravenous  Once 12/12/24 1111 12/12/24 1119   12/12/24 1115  ceFEPIme  (MAXIPIME ) 2 g in sodium chloride  0.9 % 100 mL IVPB        2 g 200 mL/hr over 30 Minutes Intravenous  Once 12/12/24 1111 12/12/24 1353         Subjective: Patient seen and examined.  When I asked her how she was feeling, she was not answering my question.  I asked her again, she said  I am hesitant to answer.  I asked her the reason for that, she said  because you not going to let me go home if I tell you the truth.  She then confirmed that she is hurting and is not feeling any better than yesterday.  She was unable to flex her fingers.  Hand appears to be swollen as well.  Objective: Vitals:   12/12/24 1634 12/12/24 1940 12/13/24 0600 12/13/24 0741  BP: 115/70 128/72  (!) 117/53  Pulse: (!) 102 (!) 102  96  Resp: 18 18  17   Temp: 98.7 F (37.1 C) 99.6 F (37.6 C)  98 F (36.7 C)  TempSrc:  Oral  Oral  SpO2: 94% 93%  95%  Weight:   98.6 kg    Height:        Intake/Output Summary (Last 24 hours) at 12/13/2024 1039 Last data filed at 12/13/2024 0600 Gross per 24 hour  Intake 720 ml  Output 500 ml  Net 220 ml   Filed Weights   12/12/24 1105 12/13/24 0600  Weight: 104.3 kg 98.6 kg    Examination:  General exam: Appears calm and comfortable  Respiratory system: Clear to auscultation. Respiratory effort normal. Cardiovascular system: S1 & S2 heard, RRR. No JVD, murmurs, rubs, gallops or clicks. No pedal edema. Gastrointestinal system: Abdomen is nondistended, soft and nontender. No organomegaly or masses felt. Normal bowel sounds heard. Central nervous system: Alert  and oriented. No focal neurological deficits. Extremities: Symmetric 5 x 5 power.  Moderate edema of the left hand with erythema and limited range of motion in the fingers due to pain and swelling. Skin: No rashes, lesions or ulcers Psychiatry: Judgement and insight appear normal. Mood & affect appropriate.    Data Reviewed: I have personally reviewed following labs and imaging studies  CBC: Recent Labs  Lab 12/12/24 1217 12/13/24 0625  WBC 9.5 11.4*  NEUTROABS 6.9  --   HGB 17.1* 15.7*  HCT 53.0* 49.3*  MCV 89.1 89.2  PLT 202 248   Basic Metabolic Panel: Recent Labs  Lab 12/12/24 1217 12/13/24 0625  NA 138 138  K 4.5 4.5  CL 102 103  CO2 22 25  GLUCOSE 94 94  BUN 25* 31*  CREATININE 1.05* 1.20*  CALCIUM  9.2 9.2   GFR: Estimated Creatinine Clearance: 48.5 mL/min (A) (by C-G formula based on SCr of 1.2 mg/dL (H)). Liver Function Tests: Recent Labs  Lab 12/12/24 1217  AST 22  ALT 16  ALKPHOS 87  BILITOT 1.0  PROT 7.5  ALBUMIN 3.9   No results for input(s): LIPASE, AMYLASE in the last 168 hours. No results for input(s): AMMONIA in the last 168 hours. Coagulation Profile: No results for input(s): INR, PROTIME in the last 168 hours. Cardiac Enzymes: No results for input(s): CKTOTAL, CKMB, CKMBINDEX, TROPONINI in  the last 168 hours. BNP (last 3 results) No results for input(s): PROBNP in the last 8760 hours. HbA1C: Recent Labs    12/12/24 1825  HGBA1C 6.1*   CBG: No results for input(s): GLUCAP in the last 168 hours. Lipid Profile: No results for input(s): CHOL, HDL, LDLCALC, TRIG, CHOLHDL, LDLDIRECT in the last 72 hours. Thyroid Function Tests: No results for input(s): TSH, T4TOTAL, FREET4, T3FREE, THYROIDAB in the last 72 hours. Anemia Panel: No results for input(s): VITAMINB12, FOLATE, FERRITIN, TIBC, IRON, RETICCTPCT in the last 72 hours. Sepsis Labs: Recent Labs  Lab 12/12/24 1220  LATICACIDVEN 1.6    Recent Results (from the past 240 hours)  Culture, blood (routine x 2)     Status: None (Preliminary result)   Collection Time: 12/12/24 12:17 PM   Specimen: BLOOD RIGHT ARM  Result Value Ref Range Status   Specimen Description BLOOD RIGHT ARM  Final   Special Requests   Final    BOTTLES DRAWN AEROBIC ONLY Blood Culture results may not be optimal due to an inadequate volume of blood received in culture bottles   Culture   Final    NO GROWTH < 24 HOURS Performed at [facility], [address], [place], [place] [address]    Report Status PENDING  Incomplete  Culture, blood (routine x 2)     Status: None (Preliminary result)   Collection Time: 12/12/24  6:26 PM   Specimen: BLOOD RIGHT ARM  Result Value Ref Range Status   Specimen Description BLOOD RIGHT ARM  Final   Special Requests   Final    BOTTLES DRAWN AEROBIC AND ANAEROBIC Blood Culture adequate volume   Culture   Final    NO GROWTH < 12 HOURS Performed at [facility], [address], [place], [place] [address]    Report Status PENDING  Incomplete     Radiology Studies: DG Wrist Complete Left Result Date: 12/12/2024 CLINICAL DATA:  Left wrist pain and swelling for several days. EXAM: LEFT WRIST - COMPLETE 3+ VIEW COMPARISON:  None Available. FINDINGS: There is no  evidence  of fracture or dislocation. Severe degenerative changes seen involving first carpometacarpal joint. Dorsal soft tissue swelling is noted as well as swelling around the thumb suggesting possible cellulitis. IMPRESSION: Severe osteoarthritis of first carpometacarpal joint. No fracture or dislocation. Dorsal soft tissue swelling is noted as well as swelling around the thumb suggesting possible cellulitis. Electronically Signed   By: Lynwood Landy Raddle M.D.   On: 12/12/2024 12:16   DG Hand Complete Left Result Date: 12/12/2024 CLINICAL DATA:  Left hand pain and swelling for 3 days without injury. EXAM: LEFT HAND - COMPLETE 3+ VIEW COMPARISON:  None Available. FINDINGS: There is no evidence of fracture or dislocation. Severe degenerative changes seen involving first carpometacarpal joint. Dorsal soft tissue swelling is noted suggesting possible infection or cellulitis. IMPRESSION: Severe osteoarthritis of first carpometacarpal joint. Dorsal soft tissue swelling is noted suggesting possible infection or cellulitis. Electronically Signed   By: Lynwood Landy Raddle M.D.   On: 12/12/2024 12:14    Scheduled Meds:  amLODipine   10 mg Oral Daily   enoxaparin  (LOVENOX ) injection  40 mg Subcutaneous Q24H   lisinopril   40 mg Oral Daily   rosuvastatin   10 mg Oral QHS   sodium chloride  flush  3 mL Intravenous Q12H   Continuous Infusions:  ceFEPime  (MAXIPIME ) IV 2 g (12/13/24 0008)   vancomycin        LOS: 0 days   Fredia Skeeter, MD [facility]  12/13/2024, 10:39 AM   *Please note that this is a verbal dictation therefore any spelling or grammatical errors are due to the Dragon [employer] One system interpretation.  Please page via Amion and do not message via secure chat for urgent patient care matters. Secure chat can be used for non urgent patient care matters.  How to contact the [facility] Attending or Consulting provider 7A - 7P or covering provider during after hours 7P -7A, for this patient?  Check  the care team in [facility] and look for a) attending/consulting [facility] provider listed and b) the [facility] team listed. Page or secure chat 7A-7P. Log into [email or web address] and use [facility] universal password to access. If you do not have the password, please contact the hospital operator. Locate the [facility] provider you are looking for under [facility] and page to a number that you can be directly reached. If you still have difficulty reaching the provider, please page the [facility] (Director on Call) for the Hospitalists listed on amion for assistance.  "

## 2024-12-14 ENCOUNTER — Encounter (HOSPITAL_COMMUNITY): Payer: Self-pay | Admitting: Orthopedic Surgery

## 2024-12-14 ENCOUNTER — Other Ambulatory Visit (HOSPITAL_COMMUNITY): Payer: Self-pay

## 2024-12-14 DIAGNOSIS — L03119 Cellulitis of unspecified part of limb: Secondary | ICD-10-CM | POA: Diagnosis not present

## 2024-12-14 LAB — BASIC METABOLIC PANEL WITH GFR
Anion gap: 9 (ref 5–15)
BUN: 27 mg/dL — ABNORMAL HIGH (ref 8–23)
CO2: 21 mmol/L — ABNORMAL LOW (ref 22–32)
Calcium: 8.1 mg/dL — ABNORMAL LOW (ref 8.9–10.3)
Chloride: 104 mmol/L (ref 98–111)
Creatinine, Ser: 0.99 mg/dL (ref 0.44–1.00)
GFR, Estimated: >60 mL/min
Glucose, Bld: 140 mg/dL — ABNORMAL HIGH (ref 70–99)
Potassium: 4.7 mmol/L (ref 3.5–5.1)
Sodium: 134 mmol/L — ABNORMAL LOW (ref 135–145)

## 2024-12-14 LAB — CBC WITH DIFFERENTIAL/PLATELET
Abs Immature Granulocytes: 0.02 K/uL (ref 0.00–0.07)
Basophils Absolute: 0.1 K/uL (ref 0.0–0.1)
Basophils Relative: 1 %
Eosinophils Absolute: 0.4 K/uL (ref 0.0–0.5)
Eosinophils Relative: 5 %
HCT: 40.7 % (ref 36.0–46.0)
Hemoglobin: 13.1 g/dL (ref 12.0–15.0)
Immature Granulocytes: 0 %
Lymphocytes Relative: 21 %
Lymphs Abs: 1.6 K/uL (ref 0.7–4.0)
MCH: 28.9 pg (ref 26.0–34.0)
MCHC: 32.2 g/dL (ref 30.0–36.0)
MCV: 89.8 fL (ref 80.0–100.0)
Monocytes Absolute: 0.9 K/uL (ref 0.1–1.0)
Monocytes Relative: 12 %
Neutro Abs: 4.6 K/uL (ref 1.7–7.7)
Neutrophils Relative %: 61 %
Platelets: 209 K/uL (ref 150–400)
RBC: 4.53 MIL/uL (ref 3.87–5.11)
RDW: 15.7 % — ABNORMAL HIGH (ref 11.5–15.5)
WBC: 7.5 K/uL (ref 4.0–10.5)
nRBC: 0 % (ref 0.0–0.2)

## 2024-12-14 MED ORDER — TRAMADOL HCL 50 MG PO TABS
50.0000 mg | ORAL_TABLET | Freq: Four times a day (QID) | ORAL | 0 refills | Status: AC | PRN
  Filled 2024-12-14: qty 30, 8d supply, fill #0

## 2024-12-14 MED ORDER — ENOXAPARIN SODIUM 60 MG/0.6ML IJ SOSY
50.0000 mg | PREFILLED_SYRINGE | INTRAMUSCULAR | Status: DC
  Filled 2024-12-14: qty 0.6

## 2024-12-14 MED ORDER — DOXYCYCLINE HYCLATE 100 MG PO TABS
100.0000 mg | ORAL_TABLET | Freq: Two times a day (BID) | ORAL | 0 refills | Status: AC
  Filled 2024-12-14: qty 28, 14d supply, fill #0

## 2024-12-14 NOTE — Discharge Summary (Addendum)
 Physician Discharge Summary  [name]:[id number] DOB: 10/13/1956 DOA: 12/12/2024  PCP: Haze Kingfisher, MD  Admit date: 12/12/2024 Discharge date: 12/14/2024 30 Day Unplanned Readmission Risk Score    Flowsheet Row ED to Hosp-Admission (Current) from 12/12/2024 in [facility]  [facility]  30 Day Unplanned Readmission Risk Score (%) 17.8 Filed at 12/14/2024 0801    This score is the patient's risk of an unplanned readmission within 30 days of being discharged (0 -100%). The score is based on dignosis, age, lab data, medications, orders, and past utilization.   Low:  0-14.9   Medium: 15-21.9   High: 22-29.9   Extreme: 30 and above          Admitted From: Home Disposition: Home  Recommendations for Outpatient Follow-up:  Follow up with PCP in 1-2 weeks Please obtain BMP/CBC in one week Follow-up with hand surgeon Dr. Murrell in 1 week Please follow up with your PCP on the following pending results: Unresulted Labs (From admission, onward)     Start     Ordered   12/15/24 0500  CBC  Daily,   R      12/14/24 0910              Home Health: None Equipment/Devices: None  Discharge Condition: Stable CODE STATUS: Full code Diet recommendation:  Diet Order             Diet Carb Modified  Diet effective now                   Subjective: Patient seen and examined.  She says that she is feeling much better than yesterday.  Still has left hand pain but she thinks it is controlled and she can go home if prescribed some pain medications.  I offered her to stay overnight if she believes her pain is not going to be controlled however patient is very eager to go home.  Brief/Interim Summary: Shari Obrien is a 69 y.o. female with medical history significant of hypertension, diastolic congestive heart failure, COPD, and arthritis presented with a swollen and painful hand after cutting it while preparing/cutting raw chicken 3 to 4 days ago.  Pain  gets worse with any movement of the hand/fingers.  No fever or chills have been noted at home.  Tried using multiple things at home for the pain including ice, heat, NSAIDs, and tramadol  without improvement in pain.  She went to urgent care and eventually sent to the emergency department.   In the emergency department patient was noted to be afebrile with heart rates 110-131, and all other vital signs maintained.  Labs noted hemoglobin 17.1, BUN 25, creatinine 1.05, uric acid 8.5, CRP 21.8, and lactic acid 1.6.  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  Patient was given ketorolac , morphine , vancomycin , and cefepime .  Admitted under hospital service.  Details below.   Left hand cellulitis: Did not meet criteria for sepsis.  X-ray negative for fracture, confirms cellulitis.  Patient still with severe pain, screams with any soft touch, unable to flex fingers. Although I doubted osteomyelitis however due to the sensitive nature of vital organ such as hand and concerned about deep infection in the tendon sheath, I ordered stat MRI and consulted orthopedics/hand surgery immediately.  Patient was seen by hand surgery soon after consultation, they canceled MRI,  they took the patient to the OR same day in the afternoon and patient underwent I&D.  Patient is doing better today.  She is afebrile and hemodynamically stable.  Leukocytosis resolved.  Discussed with hand surgery, they have cleared the patient for discharge and recommended 2 weeks of doxycycline.  Patient's wound culture is growing gram-positive streptococci, MRSA negative.   Essential hypertension: Controlled, resume home medications.   Chronic diastolic congestive heart failure: Appears euvolemic.  Resume home medications.   Type 2 diabetes mellitus: Diet controlled.  Hemoglobin A1c 7.0  about a year ago but now 6.1.    Chronic kidney disease stage IIIa Creatinine noted to be 1.05 with BUN 25.  Creatinine appears improved from prior check.   Polycythemia Chronic.  Hemoglobin noted to be elevated at 17.1. - Continue to monitor   COPD No significant wheezing noted on physical exam at this time. - Albuterol  nebs as need    Hyperlipidemia - Continue Crestor .  Patient appears to be on atorvastatin  as well PTA which I have discontinued.   Morbid obesity BMI 42.07 kg/m.  Weight loss and diet modification counseled.  Discharge plan was discussed with patient and/or family member and they verbalized understanding and agreed with it.  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   COPD (chronic obstructive pulmonary disease) (HCC)   Hyperlipidemia   Obesity, Class III, BMI 40-49.9 (morbid obesity) (HCC)    Discharge Instructions   Allergies as of 12/14/2024       Reactions   Periactin [cyproheptadine] Swelling        Medication List     STOP taking these medications    atorvastatin  20 MG tablet Commonly known as: LIPITOR   Besivance 0.6 % Susp Generic drug: Besifloxacin HCl   Difluprednate 0.05 % Emul   hydrALAZINE  25 MG tablet Commonly known as: APRESOLINE    hydrocortisone  2.5 % cream   ipratropium-albuterol  0.5-2.5 (3) MG/3ML Soln Commonly known as: DUONEB   nicotine  21 mg/24hr patch Commonly known as: NICODERM CQ  - dosed in mg/24 hours   Ozempic  (0.25 or 0.5 MG/DOSE) 2 MG/3ML Sopn Generic drug: Semaglutide (0.25 or 0.5MG /DOS)   predniSONE  5 MG tablet Commonly known as: DELTASONE    Prolensa 0.07 % Soln Generic drug: Bromfenac Sodium   senna-docusate  8.6-50 MG tablet Commonly known as: Senokot-S   spironolactone  25 MG tablet Commonly known as: ALDACTONE        TAKE these medications    acetaminophen  650 MG CR tablet Commonly known as: TYLENOL  Take 650 mg by mouth every 8 (eight) hours as needed for pain.   albuterol  108 (90 Base) MCG/ACT inhaler Commonly known as: Proventil  HFA Inhale 2 puffs into the lungs every 4 (four) hours as needed for wheezing or shortness of breath.   amLODipine  10 MG tablet Commonly known as: NORVASC  Take 1 tablet (10 mg total) by mouth daily.   doxycycline 100 MG tablet Commonly known as: VIBRA-TABS Take 1 tablet (100 mg total) by mouth 2 (two) times daily for 14 days.   DULoxetine  60 MG capsule Commonly known as: Cymbalta  Take 1 capsule (60 mg total) by mouth daily. For back pain   furosemide  40 MG tablet Commonly known as: LASIX  Take 1 tablet (40 mg total)  by mouth daily.   lidocaine  5 % Commonly known as: LIDODERM  Place 2 patches onto the skin daily. Remove & Discard patch within 12 hours or as directed by MD   lisinopril  40 MG tablet Commonly known as: ZESTRIL  Take 1 tablet (40 mg total) by mouth daily to lower blood pressure.   pantoprazole  40 MG tablet Commonly known as: PROTONIX  Take 1 tablet (40 mg total) by mouth 2 (two) times daily.   polyethylene glycol 17 g packet Commonly known as: MIRALAX  / GLYCOLAX  Take 17 g by mouth 2 (two) times daily.   rosuvastatin  10 MG tablet Commonly known as: CRESTOR  Take 10 mg by mouth at bedtime.   tiZANidine  4 MG tablet Commonly known as: Zanaflex  Take 1 tablet (4 mg total) by mouth every 8 (eight) hours as needed for muscle spasms.   traMADol  50 MG tablet Commonly known as: ULTRAM  Take 1 tablet (50 mg total) by mouth every 6 (six) hours as needed for moderate pain (pain score 4-6) or severe pain (pain score 7-10). What changed: when to take this   Trelegy Ellipta  100-62.5-25 MCG/ACT Aepb Generic drug:  Fluticasone -Umeclidin-Vilant Inhale 1 puff into the lungs daily. Must have office visit for refills What changed:  when to take this reasons to take this        Follow-up Information     Murrell Drivers, MD Follow up in 1 week(s).   Specialty: Orthopedic Surgery Contact information: [address] [place] [address] [phone]         Haze Kingfisher, MD Follow up in 1 week(s).   Specialty: Family Medicine Contact information: [address] [place] [address] [phone]                Allergies[1]  Consultations: Hand surgery   Procedures/Studies: DG Wrist Complete Left Result Date: 12/12/2024 CLINICAL DATA:  Left wrist pain and swelling for several days. EXAM: LEFT WRIST - COMPLETE 3+ VIEW COMPARISON:  None Available. FINDINGS: There is no evidence of fracture or dislocation. Severe degenerative changes seen involving first carpometacarpal joint. Dorsal soft tissue swelling is noted as well as swelling around the thumb suggesting possible cellulitis. IMPRESSION: Severe osteoarthritis of first carpometacarpal joint. No fracture or dislocation. Dorsal soft tissue swelling is noted as well as swelling around the thumb suggesting possible cellulitis. Electronically Signed   By: Lynwood Landy Raddle M.D.   On: 12/12/2024 12:16   DG Hand Complete Left Result Date: 12/12/2024 CLINICAL DATA:  Left hand pain and swelling for 3 days without injury. EXAM: LEFT HAND - COMPLETE 3+ VIEW COMPARISON:  None Available. FINDINGS: There is no evidence of fracture or dislocation. Severe degenerative changes seen involving first carpometacarpal joint. Dorsal soft tissue swelling is noted suggesting possible infection or cellulitis. IMPRESSION: Severe osteoarthritis of first carpometacarpal joint. Dorsal soft tissue swelling is noted suggesting possible infection or cellulitis. Electronically Signed   By: Lynwood Landy Raddle M.D.   On: 12/12/2024 12:14     Discharge Exam: Vitals:    12/14/24 0927 12/14/24 1017  BP: 100/76   Pulse: 88 96  Resp: 19   Temp: 98.3 F (36.8 C)   SpO2: 94% 95%   Vitals:   12/14/24 0340 12/14/24 0546 12/14/24 0927 12/14/24 1017  BP: 130/80  100/76   Pulse: 92  88 96  Resp: 18  19   Temp: 98.4 F (36.9 C)  98.3 F (36.8 C)   TempSrc: Oral  Oral   SpO2: 97%  94% 95%  Weight:  99.1 kg    Height:        General: Pt is alert, awake, not in acute distress Cardiovascular: RRR, S1/S2 +, no rubs, no gallops Respiratory: CTA bilaterally, no wheezing, no rhonchi Abdominal: Soft, NT, ND, bowel sounds + Extremities: no edema, no cyanosis, dressing in the left hand.    The results of significant diagnostics from this hospitalization (including imaging, microbiology, ancillary and laboratory) are listed below for reference.     Microbiology: Recent Results (from the past 240 hours)  Culture, blood (routine x 2)     Status: None (Preliminary result)   Collection Time: 12/12/24 12:17 PM   Specimen: BLOOD RIGHT ARM  Result Value Ref Range Status   Specimen Description BLOOD RIGHT ARM  Final   Special Requests   Final    BOTTLES DRAWN AEROBIC ONLY Blood Culture results may not be optimal due to an inadequate volume of blood received in culture bottles   Culture   Final    NO GROWTH 2 DAYS Performed at [facility], [address], [place], [place] [address]    Report Status PENDING  Incomplete  Culture, blood (routine x 2)     Status: None (Preliminary result)   Collection Time: 12/12/24  6:26 PM   Specimen: BLOOD RIGHT ARM  Result Value Ref Range Status   Specimen Description BLOOD RIGHT ARM  Final   Special Requests   Final    BOTTLES DRAWN AEROBIC AND ANAEROBIC Blood Culture adequate volume   Culture   Final    NO GROWTH 2 DAYS Performed at [facility], [address], [place], [place] [address]    Report Status PENDING  Incomplete  Surgical pcr screen     Status: Abnormal   Collection Time: 12/13/24 11:54 AM    Specimen: Nasal Mucosa; Nasal Swab  Result Value Ref Range Status   MRSA, PCR NEGATIVE NEGATIVE Final   Staphylococcus aureus POSITIVE (A) NEGATIVE Final    Comment: (NOTE) The Xpert SA Assay (FDA approved for NASAL specimens in patients 71 years of age and older), is one component of a comprehensive surveillance program. It is not intended to diagnose infection nor to guide or monitor treatment. Performed at [facility], [address], [place], [place] [address]   Aerobic/Anaerobic Culture w Gram Stain (surgical/deep wound)     Status: None (Preliminary result)   Collection Time: 12/13/24  7:51 PM   Specimen: Hand, Right; Abscess  Result Value Ref Range Status   Specimen Description WOUND  Final   Special Requests RIGHT HAND SPEC A  Final   Gram Stain   Final    NO WBC SEEN NO ORGANISMS SEEN Performed at [facility], [address] [place], [place] [address]    Culture PENDING  Incomplete   Report Status PENDING  Incomplete     Labs: BNP (last 3 results) No results for input(s): BNP in the last 8760 hours. Basic Metabolic Panel: Recent Labs  Lab 12/12/24 1217 12/13/24 0625 12/14/24 0724  NA 138 138 134*  K 4.5 4.5 4.7  CL 102 103 104  CO2 22 25 21*  GLUCOSE 94 94 140*  BUN 25* 31* 27*  CREATININE 1.05* 1.20* 0.99  CALCIUM  9.2 9.2 8.1*   Liver Function Tests: Recent Labs  Lab 12/12/24 1217  AST 22  ALT 16  ALKPHOS 87  BILITOT 1.0  PROT 7.5  ALBUMIN 3.9   No results for input(s): LIPASE, AMYLASE  in the last 168 hours. No results for input(s): AMMONIA in the last 168 hours. CBC: Recent Labs  Lab 12/12/24 1217 12/13/24 0625 12/14/24 0724  WBC 9.5 11.4* 7.5  NEUTROABS 6.9  --  4.6  HGB 17.1* 15.7* 13.1  HCT 53.0* 49.3* 40.7  MCV 89.1 89.2 89.8  PLT 202 248 209   Cardiac Enzymes: No results for input(s): CKTOTAL, CKMB, CKMBINDEX, TROPONINI in the last 168 hours. BNP: Invalid input(s): POCBNP CBG: No results  for input(s): GLUCAP in the last 168 hours. D-Dimer No results for input(s): DDIMER in the last 72 hours. Hgb A1c Recent Labs    12/12/24 1825  HGBA1C 6.1*   Lipid Profile No results for input(s): CHOL, HDL, LDLCALC, TRIG, CHOLHDL, LDLDIRECT in the last 72 hours. Thyroid function studies No results for input(s): TSH, T4TOTAL, T3FREE, THYROIDAB in the last 72 hours.  Invalid input(s): FREET3 Anemia work up No results for input(s): VITAMINB12, FOLATE, FERRITIN, TIBC, IRON, RETICCTPCT in the last 72 hours. Urinalysis    Component Value Date/Time   COLORURINE YELLOW 12/13/2024 0545   APPEARANCEUR CLEAR 12/13/2024 0545   LABSPEC 1.029 12/13/2024 0545   PHURINE 5.0 12/13/2024 0545   GLUCOSEU NEGATIVE 12/13/2024 0545   HGBUR NEGATIVE 12/13/2024 0545   BILIRUBINUR NEGATIVE 12/13/2024 0545   KETONESUR 5 (A) 12/13/2024 0545   PROTEINUR NEGATIVE 12/13/2024 0545   NITRITE NEGATIVE 12/13/2024 0545   LEUKOCYTESUR NEGATIVE 12/13/2024 0545   Sepsis Labs Recent Labs  Lab 12/12/24 1217 12/13/24 0625 12/14/24 0724  WBC 9.5 11.4* 7.5   Microbiology Recent Results (from the past 240 hours)  Culture, blood (routine x 2)     Status: None (Preliminary result)   Collection Time: 12/12/24 12:17 PM   Specimen: BLOOD RIGHT ARM  Result Value Ref Range Status   Specimen Description BLOOD RIGHT ARM  Final   Special Requests   Final    BOTTLES DRAWN AEROBIC ONLY Blood Culture results may not be optimal due to an inadequate volume of blood received in culture bottles   Culture   Final    NO GROWTH 2 DAYS Performed at [facility], [address] [place], [place] [address]    Report Status PENDING  Incomplete  Culture, blood (routine x 2)     Status: None (Preliminary result)   Collection Time: 12/12/24  6:26 PM   Specimen: BLOOD RIGHT ARM  Result Value Ref Range Status   Specimen Description BLOOD RIGHT ARM  Final   Special Requests   Final     BOTTLES DRAWN AEROBIC AND ANAEROBIC Blood Culture adequate volume   Culture   Final    NO GROWTH 2 DAYS Performed at [facility], [address], [place], [place] [address]    Report Status PENDING  Incomplete  Surgical pcr screen     Status: Abnormal   Collection Time: 12/13/24 11:54 AM   Specimen: Nasal Mucosa; Nasal Swab  Result Value Ref Range Status   MRSA, PCR NEGATIVE NEGATIVE Final   Staphylococcus aureus POSITIVE (A) NEGATIVE Final    Comment: (NOTE) The Xpert SA Assay (FDA approved for NASAL specimens in patients 66 years of age and older), is one component of a comprehensive surveillance program. It is not intended to diagnose infection nor to guide or monitor treatment. Performed at [facility], [address], [place], [place] [address]   Aerobic/Anaerobic Culture w Gram Stain (surgical/deep wound)     Status: None (Preliminary result)   Collection  Time: 12/13/24  7:51 PM   Specimen: Hand, Right; Abscess  Result Value Ref Range Status   Specimen Description WOUND  Final   Special Requests RIGHT HAND SPEC A  Final   Gram Stain   Final    NO WBC SEEN NO ORGANISMS SEEN Performed at [facility], [address], [place], [place] [address]    Culture PENDING  Incomplete   Report Status PENDING  Incomplete    FURTHER DISCHARGE INSTRUCTIONS:   Get Medicines reviewed and adjusted: Please take all your medications with you for your next visit with your Primary MD   Laboratory/radiological data: Please request your Primary MD to go over all hospital tests and procedure/radiological results at the follow up, please ask your Primary MD to get all Hospital records sent to his/her office.   In some cases, they will be blood work, cultures and biopsy results pending at the time of your discharge. Please request that your primary care M.D. goes through all the records of your hospital data and follows up on these results.   Also Note the following: If  you experience worsening of your admission symptoms, develop shortness of breath, life threatening emergency, suicidal or homicidal thoughts you must seek medical attention immediately by calling 911 or calling your MD immediately  if symptoms less severe.   You must read complete instructions/literature along with all the possible adverse reactions/side effects for all the Medicines you take and that have been prescribed to you. Take any new Medicines after you have completely understood and accpet all the possible adverse reactions/side effects.    patient was instructed, not to drive, operate heavy machinery, perform activities at heights, swimming or participation in water  activities or provide baby-sitting services while on Pain, Sleep and Anxiety Medications; until their outpatient Physician has advised to do so again. Also recommended to not to take more than prescribed Pain, Sleep and Anxiety Medications.  It is not advisable to combine anxiety, sleep and pain medications without talking with your primary care provider.     Wear Seat belts while driving.   Please note: You were cared for by a hospitalist during your hospital stay. Once you are discharged, your primary care physician will handle any further medical issues. Please note that NO REFILLS for any discharge medications will be authorized once you are discharged, as it is imperative that you return to your primary care physician (or establish a relationship with a primary care physician if you do not have one) for your post hospital discharge needs so that they can reassess your need for medications and monitor your lab values  Time coordinating discharge: Over 30 minutes  SIGNED:   Fredia Skeeter, MD  [facility] 12/14/2024, 11:45 AM *Please note that this is a verbal dictation therefore any spelling or grammatical errors are due to the Dragon Medical One system interpretation. If 7PM-7AM, please contact  night-coverage [email or web address]     [1]  Allergies Allergen Reactions   Periactin [Cyproheptadine] Swelling

## 2024-12-14 NOTE — Plan of Care (Signed)
" °  Problem: Clinical Measurements: Goal: Ability to avoid or minimize complications of infection will improve Outcome: Completed/Met   Problem: Skin Integrity: Goal: Skin integrity will improve Outcome: Completed/Met   Problem: Education: Goal: Knowledge of General Education information will improve Description: Including pain rating scale, medication(s)/side effects and non-pharmacologic comfort measures Outcome: Completed/Met   Problem: Health Behavior/Discharge Planning: Goal: Ability to manage health-related needs will improve Outcome: Completed/Met   Problem: Clinical Measurements: Goal: Ability to maintain clinical measurements within normal limits will improve Outcome: Completed/Met Goal: Will remain free from infection Outcome: Completed/Met Goal: Diagnostic test results will improve Outcome: Completed/Met Goal: Respiratory complications will improve Outcome: Completed/Met Goal: Cardiovascular complication will be avoided Outcome: Completed/Met   Problem: Activity: Goal: Risk for activity intolerance will decrease Outcome: Completed/Met   Problem: Nutrition: Goal: Adequate nutrition will be maintained Outcome: Completed/Met   Problem: Coping: Goal: Level of anxiety will decrease Outcome: Completed/Met   Problem: Elimination: Goal: Will not experience complications related to bowel motility Outcome: Completed/Met Goal: Will not experience complications related to urinary retention Outcome: Completed/Met   Problem: Pain Managment: Goal: General experience of comfort will improve and/or be controlled Outcome: Completed/Met   Problem: Safety: Goal: Ability to remain free from injury will improve Outcome: Completed/Met   Problem: Skin Integrity: Goal: Risk for impaired skin integrity will decrease Outcome: Completed/Met   Problem: Education: Goal: Knowledge of the prescribed therapeutic regimen will improve Outcome: Completed/Met   Problem:  Bowel/Gastric: Goal: Gastrointestinal status for postoperative course will improve Outcome: Completed/Met   Problem: Cardiac: Goal: Ability to maintain an adequate cardiac output Outcome: Completed/Met Goal: Will show no evidence of cardiac arrhythmias Outcome: Completed/Met   Problem: Nutritional: Goal: Will attain and maintain optimal nutritional status Outcome: Completed/Met   Problem: Neurological: Goal: Will regain or maintain usual level of consciousness Outcome: Completed/Met   Problem: Clinical Measurements: Goal: Ability to maintain clinical measurements within normal limits Outcome: Completed/Met Goal: Postoperative complications will be avoided or minimized Outcome: Completed/Met   Problem: Respiratory: Goal: Will regain and/or maintain adequate ventilation Outcome: Completed/Met Goal: Respiratory status will improve Outcome: Completed/Met   Problem: Skin Integrity: Goal: Demonstrates signs of wound healing without infection Outcome: Completed/Met   Problem: Urinary Elimination: Goal: Will remain free from infection Outcome: Completed/Met Goal: Ability to achieve and maintain adequate urine output Outcome: Completed/Met   "

## 2024-12-14 NOTE — Progress Notes (Addendum)
 After Visit Summary was printed and given to the patient. Patient prescriptions are sent to the patients pharmacy. D/c education completed with patient including follow up instructions, medication list, d/c activities limitations and the importance of elevating her arm/hand ( Sling was given to patient to help with elevation), with other d/c instructions as indicated by MD - patient able to verbalize understanding, all questions fully answered. Patient instructed to return to ED, call 911, or call MD for any changes in condition.

## 2024-12-17 LAB — CULTURE, BLOOD (ROUTINE X 2)
Culture: NO GROWTH
Culture: NO GROWTH
Special Requests: ADEQUATE

## 2024-12-18 LAB — AEROBIC/ANAEROBIC CULTURE W GRAM STAIN (SURGICAL/DEEP WOUND)
Culture: NO GROWTH
Gram Stain: NONE SEEN
